# Patient Record
Sex: Male | Born: 1940 | Race: Black or African American | Hispanic: No | Marital: Married | Smoking: Former smoker
Health system: Southern US, Community
[De-identification: ages and names within clinical notes are randomized; demographics above are authoritative.]

## PROBLEM LIST (undated history)

## (undated) DIAGNOSIS — E119 Type 2 diabetes mellitus without complications: Secondary | ICD-10-CM

## (undated) DIAGNOSIS — E049 Nontoxic goiter, unspecified: Secondary | ICD-10-CM

## (undated) DIAGNOSIS — Z9889 Other specified postprocedural states: Secondary | ICD-10-CM

## (undated) DIAGNOSIS — E785 Hyperlipidemia, unspecified: Secondary | ICD-10-CM

## (undated) DIAGNOSIS — Z8719 Personal history of other diseases of the digestive system: Secondary | ICD-10-CM

## (undated) DIAGNOSIS — I10 Essential (primary) hypertension: Secondary | ICD-10-CM

## (undated) HISTORY — PX: HERNIA REPAIR: SHX51

## (undated) HISTORY — DX: Hyperlipidemia, unspecified: E78.5

## (undated) HISTORY — DX: Personal history of other diseases of the digestive system: Z87.19

## (undated) HISTORY — PX: COLONOSCOPY: SHX174

## (undated) HISTORY — DX: Nontoxic goiter, unspecified: E04.9

## (undated) HISTORY — DX: Other specified postprocedural states: Z98.890

## (undated) HISTORY — DX: Type 2 diabetes mellitus without complications: E11.9

---

## 2004-03-17 ENCOUNTER — Other Ambulatory Visit: Payer: Self-pay

## 2005-10-27 ENCOUNTER — Inpatient Hospital Stay: Payer: Self-pay

## 2006-05-25 ENCOUNTER — Encounter: Admission: RE | Admit: 2006-05-25 | Discharge: 2006-05-25 | Payer: Self-pay | Admitting: General Surgery

## 2006-05-28 ENCOUNTER — Ambulatory Visit (HOSPITAL_BASED_OUTPATIENT_CLINIC_OR_DEPARTMENT_OTHER): Admission: RE | Admit: 2006-05-28 | Discharge: 2006-05-28 | Payer: Self-pay | Admitting: General Surgery

## 2006-08-21 ENCOUNTER — Emergency Department (HOSPITAL_COMMUNITY): Admission: EM | Admit: 2006-08-21 | Discharge: 2006-08-21 | Payer: Self-pay | Admitting: Family Medicine

## 2006-11-06 HISTORY — PX: CYST EXCISION: SHX5701

## 2006-11-14 ENCOUNTER — Ambulatory Visit (HOSPITAL_COMMUNITY): Admission: RE | Admit: 2006-11-14 | Discharge: 2006-11-14 | Payer: Self-pay | Admitting: Gastroenterology

## 2008-06-29 ENCOUNTER — Emergency Department (HOSPITAL_COMMUNITY): Admission: EM | Admit: 2008-06-29 | Discharge: 2008-06-29 | Payer: Self-pay | Admitting: General Surgery

## 2011-03-24 NOTE — Op Note (Signed)
NAME:  Dennis Ford, Dennis Ford               ACCOUNT NO.:  [id number]   MEDICAL RECORD NO.:  [id number]          PATIENT TYPE:  AMB   LOCATION:  DSC                          FACILITY:  [facility]   PHYSICIAN:  Cherylynn Ridges, M.D.    DATE OF BIRTH:  22-Mar-1941   DATE OF PROCEDURE:  05/28/2006  DATE OF DISCHARGE:                                 OPERATIVE REPORT   PREOPERATIVE DIAGNOSIS:  Left inguinal hernia.   POSTOPERATIVE DIAGNOSIS:  Direct left inguinal hernia with lipoma of the  cord.   PROCEDURE:  Left inguinal hernia repair with mesh.   SURGEON:  Dr. Jimmye Norman.   ASSISTANT:  Dirk Dress, medical student.   ANESTHESIA:  General with a laryngeal airway.   ESTIMATED BLOOD LOSS:  Less than 20 mL.   COMPLICATIONS:  None.   CONDITION:  Stable.   INDICATIONS FOR OPERATION:  The patient is a 70 year old male with recently  diagnosed with left inguinal hernia who comes in now for repair.   FINDINGS:  The patient had a direct defect without an indirect component.  He did have a sizable small lipoma of the cord which was resected.   OPERATION:  The patient was taken to the operating room, placed on table in  supine position.  After an adequate general laryngeal airway anesthetic was  administered, he was prepped and draped in usual sterile manner exposing the  left groin area.   A 6-cm transverse curvilinear incision was made at the level of the  superficial ring and taken down to and through Scarpa's fascia down to the  external oblique fascia.  We then dissected out the external oblique fascia  in the superficial ring, made an incision along the fibers of the external  oblique through the superficial ring.  We were then able to dissect out the  spermatic cord along with the ilioinguinal ligament and the ilioinguinal  nerve and preserve the nerve and keep it out of the way during our repair.   Once we had spermatic cord adequately dissected free and we inspected the  anterior  medial aspect of the core where we did not find an indirect sac.  There was a lateral lipoma of the cord which was dissected back to almost  internal ring and then suture ligated at its base and transected.  The vas  deferens and the ilioinguinal nerve were preserved and kept out of the way  during the repair.  There was a large direct defect which protruded out  through the inguinal canal.  We imbricated on itself using two simple  stitches of 0 Ethibond suture and then we placed a piece of oval mesh  measuring approximately 4.5 x 2 cm in size on the floor of the inguinal  canal attaching it to the pubic tubercle, inferolaterally to the reflected  portion of inguinal ligament and anteromedially to the conjoined tendon.  This was done using running 0 Prolene suture.  Once that was secured in  place we passed spermatic cord back into the canal, oversewed the external  oblique fascia on top using running 3-0 Vicryl  suture taking care not to  entrap the ilioinguinal nerve with this repair.  We then irrigated with  antibiotic solution in which the which the mesh had been soaked prior to  implantation.  Once external oblique fascia was reapproximated we  reapproximated Scarpa fascia using three interrupted simple stitches of 3-0  Vicryl and skin was closed using running subcuticular stitch of 4-0  Monocryl.  Half percent Marcaine without epi was injected into the wound  prior to closure and a sterile dressing was applied.  All counts were  correct including needle sutures, instruments and tapes.      Cherylynn Ridges, M.D.  Electronically Signed     JOW/MEDQ  D:  05/28/2006  T:  05/28/2006  Job:  [id number]

## 2011-10-17 ENCOUNTER — Emergency Department (HOSPITAL_COMMUNITY)
Admission: EM | Admit: 2011-10-17 | Discharge: 2011-10-17 | Disposition: A | Discharge status: Home / Self Care | Payer: Medicare Other | Attending: Emergency Medicine | Admitting: Emergency Medicine

## 2011-10-17 ENCOUNTER — Emergency Department (HOSPITAL_COMMUNITY): Payer: Medicare Other

## 2011-10-17 ENCOUNTER — Encounter: Payer: Self-pay | Admitting: *Deleted

## 2011-10-17 DIAGNOSIS — K4091 Unilateral inguinal hernia, without obstruction or gangrene, recurrent: Secondary | ICD-10-CM | POA: Insufficient documentation

## 2011-10-17 DIAGNOSIS — R1011 Right upper quadrant pain: Secondary | ICD-10-CM | POA: Insufficient documentation

## 2011-10-17 DIAGNOSIS — Z79899 Other long term (current) drug therapy: Secondary | ICD-10-CM | POA: Insufficient documentation

## 2011-10-17 DIAGNOSIS — R109 Unspecified abdominal pain: Secondary | ICD-10-CM

## 2011-10-17 DIAGNOSIS — K4090 Unilateral inguinal hernia, without obstruction or gangrene, not specified as recurrent: Secondary | ICD-10-CM

## 2011-10-17 DIAGNOSIS — R197 Diarrhea, unspecified: Secondary | ICD-10-CM | POA: Insufficient documentation

## 2011-10-17 DIAGNOSIS — I10 Essential (primary) hypertension: Secondary | ICD-10-CM | POA: Insufficient documentation

## 2011-10-17 HISTORY — DX: Essential (primary) hypertension: I10

## 2011-10-17 LAB — URINALYSIS, ROUTINE W REFLEX MICROSCOPIC
Glucose, UA: NEGATIVE mg/dL
Hgb urine dipstick: NEGATIVE
Ketones, ur: NEGATIVE mg/dL
Leukocytes, UA: NEGATIVE
Specific Gravity, Urine: 1.006 (ref 1.005–1.030)

## 2011-10-17 LAB — DIFFERENTIAL
Basophils Absolute: 0 10*3/uL (ref 0.0–0.1)
Basophils Relative: 1 % (ref 0–1)
Eosinophils Relative: 1 % (ref 0–5)
Monocytes Relative: 9 % (ref 3–12)
Neutrophils Relative %: 42 % — ABNORMAL LOW (ref 43–77)

## 2011-10-17 LAB — CBC
Hemoglobin: 14.8 g/dL (ref 13.0–17.0)
MCHC: 33.5 g/dL (ref 30.0–36.0)
RDW: 15 % (ref 11.5–15.5)
WBC: 5.3 10*3/uL (ref 4.0–10.5)

## 2011-10-17 LAB — POCT I-STAT, CHEM 8
Creatinine, Ser: 0.9 mg/dL (ref 0.50–1.35)
Glucose, Bld: 105 mg/dL — ABNORMAL HIGH (ref 70–99)
HCT: 50 % (ref 39.0–52.0)

## 2011-10-17 MED ORDER — HYDROCODONE-ACETAMINOPHEN 5-325 MG PO TABS
1.0000 | ORAL_TABLET | Freq: Once | ORAL | Status: AC
  Administered 2011-10-17: 1 via ORAL
  Filled 2011-10-17: qty 1

## 2011-10-17 MED ORDER — HYDROCODONE-ACETAMINOPHEN 5-500 MG PO TABS: 1.0000 | ORAL_TABLET | Freq: Four times a day (QID) | ORAL | Status: AC | PRN

## 2011-10-17 NOTE — ED Provider Notes (Signed)
History     CSN: [id number] Arrival date & time: 10/17/2011  3:07 PM   First MD Initiated Contact with Patient 10/17/11 2011      Chief Complaint  Patient presents with  . Abdominal Pain    (Consider location/radiation/quality/duration/timing/severity/associated sxs/prior treatment) HPI Comments: Patient has a known right inguinal hernia is awaiting surgery sometime in January now is having right upper abdominal pain for "a while" no change in bowel habits.  No change in urinary frequency.  No UTI symptoms.  No nausea or vomiting.  Has not taken anything for pain.  Position does not change the pain.  No testicular pain  Patient is a 70 y.o. male presenting with abdominal pain. The history is provided by the patient.  Abdominal Pain The primary symptoms of the illness include abdominal pain and diarrhea. The primary symptoms of the illness do not include nausea, vomiting or dysuria. The current episode started more than 2 days ago. The onset of the illness was gradual. The problem has been gradually worsening.  The patient states that she believes she is currently not pregnant. The patient has not had a change in bowel habit. Symptoms associated with the illness do not include chills, anorexia, diaphoresis, heartburn, constipation, hematuria, frequency or back pain.    Past Medical History  Diagnosis Date  . Hypertension     Past Surgical History  Procedure Date  . Hernia repair     History reviewed. No pertinent family history.  History  Substance Use Topics  . Smoking status: Not on file  . Smokeless tobacco: Not on file  . Alcohol Use: No      Review of Systems  Constitutional: Negative for chills and diaphoresis.  HENT: Negative.   Eyes: Negative.   Respiratory: Negative.   Cardiovascular: Negative.   Gastrointestinal: Positive for abdominal pain and diarrhea. Negative for heartburn, nausea, vomiting, constipation, blood in stool and anorexia.  Genitourinary:  Negative for dysuria, frequency and hematuria.  Musculoskeletal: Negative for back pain.  Skin: Negative.   Neurological: Negative.   Hematological: Negative.   Psychiatric/Behavioral: Negative.     Allergies  Review of patient's allergies indicates no known allergies.  Home Medications   Current Outpatient Rx  Name Route Sig Dispense Refill  . ACETAMINOPHEN 500 MG PO TABS Oral Take 1,000 mg by mouth every 6 (six) hours as needed. Usually only needs once per day   For pain     . ALLOPURINOL 100 MG PO TABS Oral Take 100 mg by mouth daily.      Marland Kitchen BENAZEPRIL HCL 20 MG PO TABS Oral Take 10 mg by mouth daily.      Marland Kitchen HYDROCODONE-ACETAMINOPHEN 5-500 MG PO TABS Oral Take 1-2 tablets by mouth every 6 (six) hours as needed for pain. 15 tablet 0    BP 157/89  Pulse 83  Temp(Src) 98.1 F (36.7 C) (Oral)  Resp 19  SpO2 98%  Physical Exam  Constitutional: He is oriented to person, place, and time. He appears well-developed and well-nourished.  HENT:  Head: Normocephalic.  Eyes: Pupils are equal, round, and reactive to light.  Neck: Normal range of motion.  Cardiovascular: Normal rate.   Pulmonary/Chest: Breath sounds normal.  Abdominal: Soft. Bowel sounds are normal. He exhibits no distension. There is no hepatosplenomegaly. There is tenderness in the right upper quadrant and right lower quadrant. There is no rigidity, no rebound, no guarding, no CVA tenderness and negative Murphy's sign. A hernia is present. Hernia confirmed positive in the  right inguinal area.       R inguinal hernia  Moves freely  within the inguinal canal no sign of incarceration   Genitourinary: Penis normal.  Musculoskeletal: Normal range of motion.  Neurological: He is oriented to person, place, and time.  Skin: Skin is warm and dry.    ED Course  Procedures (including critical care time)  Labs Reviewed  DIFFERENTIAL - Abnormal; Notable for the following:    Neutrophils Relative 42 (*)    Lymphocytes  Relative 48 (*)    All other components within normal limits  POCT I-STAT, CHEM 8 - Abnormal; Notable for the following:    Glucose, Bld 105 (*)    All other components within normal limits  URINALYSIS, ROUTINE W REFLEX MICROSCOPIC  CBC  I-STAT, CHEM 8   Dg Abd Acute W/chest  10/17/2011  *RADIOLOGY REPORT*  Clinical Data: Right flank pain, shortness of breath.  Hernia repair.  Hypertension.  ACUTE ABDOMEN SERIES (ABDOMEN 2 VIEW & CHEST 1 VIEW)  Comparison: Chest 06/29/2008  Findings: Slightly shallow inspiration. Normal heart size and pulmonary vascularity.  No focal consolidation in the lungs.  No blunting of costophrenic angles.  Scattered gas and stool in the colon.  No small or large bowel dilatation.  No free intra-abdominal air.  No abnormal air fluid levels.  No radiopaque stones.  Mild degenerative changes in the spine.  IMPRESSION: No evidence of active pulmonary disease.  Nonobstructive bowel gas pattern.  Original Report Authenticated By: Marlon Pel, M.D.     1. Hernia, inguinal, right   2. Abdominal discomfort     After review of labs, urine, and acute abdomen series.  There are no abnormalities.  Discussed this with patient after he received the hydrocodone.  He is pain-free.  Will prescribe hydrocodone for home use.  He  will be instructed to call the surgeon to hopefully schedule surgery sooner  MDM  Will obtain CBC i-STAT to evaluate kidney function signs of infection and get acute abdomen to rule out obstruction.  If these are all within normal limits will prescribe pain medication and have patient followup with surgeon        Arman Filter, NP 10/17/11 2055  Arman Filter, NP 10/17/11 2236  Arman Filter, NP 10/17/11 2239  Arman Filter, NP 10/17/11 2242

## 2011-10-17 NOTE — ED Notes (Signed)
Pt states that he has been having abdominal pain for a few days. Pt state that he has a hernia on his right inguinal area and that the pain has moved to his upper right side and flank. Pt alert and oriented denies any problems with urination or bowels. Pt able to move all extremities and follow commands.

## 2011-10-17 NOTE — ED Notes (Signed)
To ed for eval of rlq pain for 'a while'. States he has a known hernia and has surgery scheduled for Jan. Pt states he is unable to sleep sometimes due to the pain. Normal BM's. Denies fevers or vomiting.

## 2011-10-18 NOTE — ED Provider Notes (Signed)
Evaluation and management procedures were performed by the PA/NP under my supervision/collaboration.   Dione Booze, MD 10/18/11 [place]

## 2011-11-14 ENCOUNTER — Encounter (INDEPENDENT_AMBULATORY_CARE_PROVIDER_SITE_OTHER): Payer: Self-pay | Admitting: General Surgery

## 2011-11-14 ENCOUNTER — Ambulatory Visit (INDEPENDENT_AMBULATORY_CARE_PROVIDER_SITE_OTHER): Payer: MEDICARE | Admitting: General Surgery

## 2011-11-14 DIAGNOSIS — K4090 Unilateral inguinal hernia, without obstruction or gangrene, not specified as recurrent: Secondary | ICD-10-CM

## 2011-11-14 NOTE — Progress Notes (Signed)
Patient ID: Dennis Ford, male   DOB: 01-29-1941, 71 y.o.   MRN: [id number]  Chief Complaint  Patient presents with  . Other    est pt new prob- eval hernia    HPI Dennis Ford is a 71 y.o. male.  Right inguinal hernia HPI  Past Medical History  Diagnosis Date  . Hypertension   . History of inguinal hernia repair     Past Surgical History  Procedure Date  . Hernia repair     [facility]     History reviewed. No pertinent family history.  Social History History  Substance Use Topics  . Smoking status: Former Games developer  . Smokeless tobacco: Not on file  . Alcohol Use: No    No Known Allergies  Current Outpatient Prescriptions  Medication Sig Dispense Refill  . acetaminophen (TYLENOL) 500 MG tablet Take 1,000 mg by mouth every 6 (six) hours as needed. Usually only needs once per day   For pain       . allopurinol (ZYLOPRIM) 100 MG tablet Take 100 mg by mouth daily.        Marland Kitchen amLODipine (NORVASC) 10 MG tablet daily.      . benazepril (LOTENSIN) 20 MG tablet Take 10 mg by mouth daily.          Review of Systems Review of Systems  Constitutional: Negative.   HENT: Negative.   Eyes: Negative.   Respiratory: Negative.   Gastrointestinal: Positive for abdominal pain (right flank and side related to hernia).  Musculoskeletal: Negative.   Skin: Negative.   Neurological: Negative.   Hematological: Negative.   Psychiatric/Behavioral: Negative.     Blood pressure 156/86, pulse 84, temperature 98.6 F (37 C), temperature source Temporal, resp. rate 20, height 5' 8.5" (1.74 m), weight 182 lb 3.2 oz (82.645 kg).  Physical Exam Physical Exam  Constitutional: He is oriented to person, place, and time. He appears well-developed and well-nourished.  HENT:  Head: Normocephalic and atraumatic.  Eyes: Conjunctivae and EOM are normal. Pupils are equal, round, and reactive to light.  Neck: Neck supple.  Cardiovascular: Normal rate, regular rhythm and normal heart sounds.     Pulmonary/Chest: Effort normal and breath sounds normal.  Abdominal: Soft. He exhibits mass (Easily reducible right inguinal hernia). There is tenderness (RLQ and right flank). There is no rebound and no guarding.  Genitourinary: Rectum normal.  Musculoskeletal: Normal range of motion.  Neurological: He is alert and oriented to person, place, and time.  Skin: Skin is warm and dry.  Psychiatric: He has a normal mood and affect. His behavior is normal. Judgment and thought content normal.    Data Reviewed No current data to review  Assessment    Reducible right inguinal hernia    Plan    Open right inguinal hernia repair       Miyako Oelke III,Chardae Mulkern O 11/14/2011, 10:49 AM

## 2011-11-23 ENCOUNTER — Encounter (HOSPITAL_BASED_OUTPATIENT_CLINIC_OR_DEPARTMENT_OTHER): Payer: Self-pay | Admitting: *Deleted

## 2011-11-23 NOTE — Progress Notes (Signed)
To come in for labs and ekg-was in ED 12/12 with abd pain-referred for hernia

## 2011-11-24 ENCOUNTER — Encounter (HOSPITAL_BASED_OUTPATIENT_CLINIC_OR_DEPARTMENT_OTHER)
Admission: RE | Admit: 2011-11-24 | Discharge: 2011-11-24 | Disposition: A | Discharge status: Home / Self Care | Payer: Medicare Other | Source: Ambulatory Visit | Attending: General Surgery | Admitting: General Surgery

## 2011-11-24 LAB — CBC
Hemoglobin: 13.9 g/dL (ref 13.0–17.0)
MCHC: 32.6 g/dL (ref 30.0–36.0)
MCV: 83.2 fL (ref 78.0–100.0)
RBC: 5.13 MIL/uL (ref 4.22–5.81)
RDW: 15.6 % — ABNORMAL HIGH (ref 11.5–15.5)

## 2011-11-24 LAB — BASIC METABOLIC PANEL
BUN: 11 mg/dL (ref 6–23)
CO2: 25 mEq/L (ref 19–32)
Creatinine, Ser: 0.88 mg/dL (ref 0.50–1.35)
GFR calc non Af Amer: 85 mL/min — ABNORMAL LOW (ref >90)
Glucose, Bld: 89 mg/dL (ref 70–99)
Potassium: 4.3 mEq/L (ref 3.5–5.1)

## 2011-11-27 ENCOUNTER — Encounter (HOSPITAL_BASED_OUTPATIENT_CLINIC_OR_DEPARTMENT_OTHER): Payer: Self-pay | Admitting: Anesthesiology

## 2011-11-27 ENCOUNTER — Ambulatory Visit (HOSPITAL_BASED_OUTPATIENT_CLINIC_OR_DEPARTMENT_OTHER)
Admission: RE | Admit: 2011-11-27 | Discharge: 2011-11-27 | Disposition: A | Discharge status: Home / Self Care | Payer: Medicare Other | Source: Ambulatory Visit | Attending: General Surgery | Admitting: General Surgery

## 2011-11-27 ENCOUNTER — Encounter (HOSPITAL_BASED_OUTPATIENT_CLINIC_OR_DEPARTMENT_OTHER): Payer: Self-pay | Admitting: *Deleted

## 2011-11-27 ENCOUNTER — Ambulatory Visit (HOSPITAL_BASED_OUTPATIENT_CLINIC_OR_DEPARTMENT_OTHER): Payer: Medicare Other | Admitting: Anesthesiology

## 2011-11-27 ENCOUNTER — Encounter (HOSPITAL_BASED_OUTPATIENT_CLINIC_OR_DEPARTMENT_OTHER)
Admission: RE | Disposition: A | Discharge status: Home / Self Care | Payer: Self-pay | Source: Ambulatory Visit | Attending: General Surgery

## 2011-11-27 DIAGNOSIS — K4090 Unilateral inguinal hernia, without obstruction or gangrene, not specified as recurrent: Secondary | ICD-10-CM

## 2011-11-27 DIAGNOSIS — Z01812 Encounter for preprocedural laboratory examination: Secondary | ICD-10-CM | POA: Insufficient documentation

## 2011-11-27 DIAGNOSIS — I10 Essential (primary) hypertension: Secondary | ICD-10-CM | POA: Insufficient documentation

## 2011-11-27 HISTORY — PX: INGUINAL HERNIA REPAIR: SHX194

## 2011-11-27 LAB — POCT HEMOGLOBIN-HEMACUE: Hemoglobin: 16.3 g/dL (ref 13.0–17.0)

## 2011-11-27 SURGERY — REPAIR, HERNIA, INGUINAL, ADULT
Anesthesia: General | Site: Groin | Laterality: Right | Wound class: Clean

## 2011-11-27 MED ORDER — DEXAMETHASONE SODIUM PHOSPHATE 4 MG/ML IJ SOLN
INTRAMUSCULAR | Status: DC | PRN
  Administered 2011-11-27: 4 mg via INTRAVENOUS

## 2011-11-27 MED ORDER — PROMETHAZINE HCL 25 MG/ML IJ SOLN: 6.2500 mg | INTRAMUSCULAR | Status: DC | PRN

## 2011-11-27 MED ORDER — CHLORHEXIDINE GLUCONATE 4 % EX LIQD: 1.0000 "application " | Freq: Once | CUTANEOUS | Status: DC

## 2011-11-27 MED ORDER — ONDANSETRON HCL 4 MG/2ML IJ SOLN
INTRAMUSCULAR | Status: DC | PRN
  Administered 2011-11-27: 4 mg via INTRAVENOUS

## 2011-11-27 MED ORDER — SODIUM CHLORIDE 0.9 % IR SOLN
Status: DC | PRN
  Administered 2011-11-27: 10:00:00

## 2011-11-27 MED ORDER — BUPIVACAINE-EPINEPHRINE PF 0.25-1:200000 % IJ SOLN
INTRAMUSCULAR | Status: DC | PRN
  Administered 2011-11-27: 20 mL

## 2011-11-27 MED ORDER — CEFAZOLIN SODIUM-DEXTROSE 2-3 GM-% IV SOLR
2.0000 g | INTRAVENOUS | Status: AC
  Administered 2011-11-27: 2 g via INTRAVENOUS

## 2011-11-27 MED ORDER — PROPOFOL 10 MG/ML IV EMUL
INTRAVENOUS | Status: DC | PRN
  Administered 2011-11-27: 200 mg via INTRAVENOUS

## 2011-11-27 MED ORDER — MIDAZOLAM HCL 2 MG/2ML IJ SOLN: 1.0000 mg | INTRAMUSCULAR | Status: DC | PRN

## 2011-11-27 MED ORDER — LORAZEPAM 2 MG/ML IJ SOLN: 1.0000 mg | Freq: Once | INTRAMUSCULAR | Status: DC | PRN

## 2011-11-27 MED ORDER — LACTATED RINGERS IV SOLN
INTRAVENOUS | Status: DC
  Administered 2011-11-27 (×2): via INTRAVENOUS

## 2011-11-27 MED ORDER — HYDROCODONE-ACETAMINOPHEN 5-500 MG PO TABS: 1.0000 | ORAL_TABLET | Freq: Four times a day (QID) | ORAL | Status: AC | PRN

## 2011-11-27 MED ORDER — FENTANYL CITRATE 0.05 MG/ML IJ SOLN
INTRAMUSCULAR | Status: DC | PRN
  Administered 2011-11-27: 100 ug via INTRAVENOUS

## 2011-11-27 MED ORDER — HYDROCODONE-ACETAMINOPHEN 5-325 MG PO TABS
1.0000 | ORAL_TABLET | Freq: Once | ORAL | Status: AC
  Administered 2011-11-27: 1 via ORAL

## 2011-11-27 MED ORDER — HYDROMORPHONE HCL PF 1 MG/ML IJ SOLN
0.2500 mg | INTRAMUSCULAR | Status: DC | PRN
  Administered 2011-11-27 (×2): 0.5 mg via INTRAVENOUS

## 2011-11-27 MED ORDER — FENTANYL CITRATE 0.05 MG/ML IJ SOLN: 50.0000 ug | INTRAMUSCULAR | Status: DC | PRN

## 2011-11-27 MED ORDER — MIDAZOLAM HCL 5 MG/5ML IJ SOLN
INTRAMUSCULAR | Status: DC | PRN
  Administered 2011-11-27: 1 mg via INTRAVENOUS

## 2011-11-27 MED ORDER — EPHEDRINE SULFATE 50 MG/ML IJ SOLN
INTRAMUSCULAR | Status: DC | PRN
  Administered 2011-11-27: 10 mg via INTRAVENOUS

## 2011-11-27 MED ORDER — LIDOCAINE HCL (CARDIAC) 20 MG/ML IV SOLN
INTRAVENOUS | Status: DC | PRN
  Administered 2011-11-27: 60 mg via INTRAVENOUS

## 2011-11-27 SURGICAL SUPPLY — 52 items
BAG DECANTER FOR FLEXI CONT (MISCELLANEOUS) ×2 IMPLANT
BLADE SURG 10 STRL SS (BLADE) ×2 IMPLANT
BLADE SURG 15 STRL LF DISP TIS (BLADE) ×2 IMPLANT
BLADE SURG 15 STRL SS (BLADE) ×2
BLADE SURG ROTATE 9660 (MISCELLANEOUS) ×2 IMPLANT
CANISTER SUCTION 1200CC (MISCELLANEOUS) IMPLANT
CHLORAPREP W/TINT 26ML (MISCELLANEOUS) ×4 IMPLANT
CLEANER CAUTERY TIP 5X5 PAD (MISCELLANEOUS) ×1 IMPLANT
CLOTH BEACON ORANGE TIMEOUT ST (SAFETY) ×2 IMPLANT
COVER MAYO STAND STRL (DRAPES) ×2 IMPLANT
COVER TABLE BACK 60X90 (DRAPES) ×2 IMPLANT
DECANTER SPIKE VIAL GLASS SM (MISCELLANEOUS) ×2 IMPLANT
DERMABOND ADVANCED (GAUZE/BANDAGES/DRESSINGS) ×1
DERMABOND ADVANCED .7 DNX12 (GAUZE/BANDAGES/DRESSINGS) ×1 IMPLANT
DRAIN PENROSE 1/2X12 LTX STRL (WOUND CARE) ×2 IMPLANT
DRAPE LAPAROTOMY TRNSV 102X78 (DRAPE) ×2 IMPLANT
DRAPE UTILITY XL STRL (DRAPES) ×2 IMPLANT
DRSG TEGADERM 2-3/8X2-3/4 SM (GAUZE/BANDAGES/DRESSINGS) IMPLANT
DRSG TEGADERM 4X4.75 (GAUZE/BANDAGES/DRESSINGS) ×2 IMPLANT
ELECT REM PT RETURN 9FT ADLT (ELECTROSURGICAL) ×2
ELECTRODE REM PT RTRN 9FT ADLT (ELECTROSURGICAL) ×1 IMPLANT
GLOVE BIO SURGEON STRL SZ7 (GLOVE) ×2 IMPLANT
GLOVE BIOGEL PI IND STRL 7.0 (GLOVE) ×1 IMPLANT
GLOVE BIOGEL PI IND STRL 8 (GLOVE) ×1 IMPLANT
GLOVE BIOGEL PI INDICATOR 7.0 (GLOVE) ×1
GLOVE BIOGEL PI INDICATOR 8 (GLOVE) ×1
GLOVE ECLIPSE 7.5 STRL STRAW (GLOVE) ×2 IMPLANT
GOWN PREVENTION PLUS XLARGE (GOWN DISPOSABLE) ×4 IMPLANT
MESH HERNIA 3X6 (Mesh General) ×2 IMPLANT
NEEDLE HYPO 25X1 1.5 SAFETY (NEEDLE) ×2 IMPLANT
NS IRRIG 1000ML POUR BTL (IV SOLUTION) IMPLANT
PACK BASIN DAY SURGERY FS (CUSTOM PROCEDURE TRAY) ×2 IMPLANT
PAD CLEANER CAUTERY TIP 5X5 (MISCELLANEOUS) ×1
PENCIL BUTTON HOLSTER BLD 10FT (ELECTRODE) ×2 IMPLANT
SLEEVE SCD COMPRESS KNEE MED (MISCELLANEOUS) ×2 IMPLANT
SPONGE INTESTINAL PEANUT (DISPOSABLE) ×2 IMPLANT
SPONGE LAP 4X18 X RAY DECT (DISPOSABLE) ×2 IMPLANT
STRIP CLOSURE SKIN 1/2X4 (GAUZE/BANDAGES/DRESSINGS) ×2 IMPLANT
SUT ETHIBOND 0 MO6 C/R (SUTURE) ×2 IMPLANT
SUT MON AB 4-0 PC3 18 (SUTURE) ×2 IMPLANT
SUT PROLENE 0 CT 2 (SUTURE) ×4 IMPLANT
SUT VIC AB 3-0 SH 27 (SUTURE) ×2
SUT VIC AB 3-0 SH 27X BRD (SUTURE) ×2 IMPLANT
SUT VICRYL 4-0 PS2 18IN ABS (SUTURE) IMPLANT
SUT VICRYL AB 3 0 TIES (SUTURE) IMPLANT
SYR BULB 3OZ (MISCELLANEOUS) ×2 IMPLANT
SYR CONTROL 10ML LL (SYRINGE) ×2 IMPLANT
TOWEL OR 17X24 6PK STRL BLUE (TOWEL DISPOSABLE) ×2 IMPLANT
TOWEL OR NON WOVEN STRL DISP B (DISPOSABLE) ×2 IMPLANT
TUBE CONNECTING 20X1/4 (TUBING) IMPLANT
WATER STERILE IRR 1000ML POUR (IV SOLUTION) IMPLANT
YANKAUER SUCT BULB TIP NO VENT (SUCTIONS) IMPLANT

## 2011-11-27 NOTE — Anesthesia Preprocedure Evaluation (Signed)
Anesthesia Evaluation  Patient identified by MRN, date of birth, ID band Patient awake    Reviewed: Allergy & Precautions, H&P , NPO status , Patient's Chart, lab work & pertinent test results  Airway Mallampati: I TM Distance: >3 FB Neck ROM: Full    Dental   Pulmonary    Pulmonary exam normal       Cardiovascular hypertension,     Neuro/Psych    GI/Hepatic   Endo/Other    Renal/GU      Musculoskeletal   Abdominal   Peds  Hematology   Anesthesia Other Findings   Reproductive/Obstetrics                           Anesthesia Physical Anesthesia Plan  ASA: II  Anesthesia Plan: General   Post-op Pain Management:    Induction: Intravenous  Airway Management Planned: LMA  Additional Equipment:   Intra-op Plan:   Post-operative Plan: Extubation in OR  Informed Consent: I have reviewed the patients History and Physical, chart, labs and discussed the procedure including the risks, benefits and alternatives for the proposed anesthesia with the patient or authorized representative who has indicated his/her understanding and acceptance.     Plan Discussed with: CRNA and Surgeon  Anesthesia Plan Comments:         Anesthesia Quick Evaluation  

## 2011-11-27 NOTE — Anesthesia Postprocedure Evaluation (Signed)
  Anesthesia Post-op Note  Patient: Dennis Ford  Procedure(s) Performed:  HERNIA REPAIR INGUINAL ADULT - right inguinal hernia repair with mesh  Patient Location: PACU  Anesthesia Type: General  Level of Consciousness: awake, alert  and oriented  Airway and Oxygen Therapy: Patient Spontanous Breathing  Post-op Pain: mild  Post-op Assessment: Post-op Vital signs reviewed, Patient's Cardiovascular Status Stable, Respiratory Function Stable, Patent Airway, No signs of Nausea or vomiting, Adequate PO intake and Pain level controlled  Post-op Vital Signs: stable  Complications: No apparent anesthesia complications

## 2011-11-27 NOTE — Anesthesia Procedure Notes (Signed)
Procedure Name: LMA Insertion Date/Time: 11/27/2011 9:23 AM Performed by: Gladys Damme Pre-anesthesia Checklist: Patient identified, Timeout performed, Emergency Drugs available, Suction available and Patient being monitored Patient Re-evaluated:Patient Re-evaluated prior to inductionOxygen Delivery Method: Circle System Utilized Preoxygenation: Pre-oxygenation with 100% oxygen Intubation Type: IV induction Ventilation: Mask ventilation without difficulty LMA: LMA inserted LMA Size: 4.0 Number of attempts: 1 Placement Confirmation: breath sounds checked- equal and bilateral and positive ETCO2 Tube secured with: Tape Dental Injury: Teeth and Oropharynx as per pre-operative assessment

## 2011-11-27 NOTE — Op Note (Addendum)
OPERATIVE REPORT  DATE OF OPERATION: 11/27/2011  PATIENT:  [name]  71 y.o. male  PRE-OPERATIVE DIAGNOSIS:  right inguinal hernia  POST-OPERATIVE DIAGNOSIS:  right inguinal hernia, Indirect  PROCEDURE:  Procedure(s): HERNIA REPAIR INGUINAL ADULT  SURGEON:  Surgeon(s): Cherylynn Ridges III, MD  ASSISTANT: None  ANESTHESIA:   general  EBL: 10 ml  BLOOD ADMINISTERED: none  DRAINS: none   SPECIMEN:  No Specimen  COUNTS CORRECT:  YES  PROCEDURE DETAILS: The patient was seen again in the Holding Room. The risks, benefits, complications, treatment options, and expected outcomes were discussed with the patient. The possibilities of reaction to medication, pulmonary aspiration, perforation of viscus, bleeding, recurrent infection, the need for additional procedures, and development of a complication requiring transfusion or further operation were discussed with the patient and/or family. There was concurrence with the proposed plan, and informed consent was obtained. The site of surgery was properly noted/marked. The patient was taken to the Operating Room, identified as Americo Vallery, and the procedure verified as left inguinal hernia repair. A Time Out was held and the above information confirmed.  The patient was placed in the supine position and underwent induction of anesthesia, the lower abdomen and groin was prepped and draped in the standard fashion. A transverse incision was made on the right side.. Dissection was carried through the soft tissue to expose the inguinal canal and inguinal ligament along its lower edge. The external oblique fascia was split along the course of its fibers, exposing the inguinal canal. The cord and nerve were looped using a Penrose drain and reflected out of the field.  The indirect sac was dissected free of the spermatic cord and suture ligated at the base with two 0 Ethibond sutures The defect was exposed and a piece of mesh which had been soaked in  antibiotic solution was trimmed to size and placed over the defect. Running 0 Prolene suture was then used in a continuous fashion to repair the defect, with the suture being sewn from the pubic tubercle and reflected border of the inguinal ligament inferiorly and laterally, and then superior and medially to the conjoined tendon to a level just beyond the internal ring.  The contents were then returned to canal and the external oblique fashion was then closed in a continuous fashion using 3-0 Vicryl suture taking care not to cause entrapment.  Scarpa's fascia was reapproximated using 30 Vicryl.  The skin was then closed using a running 4-0 Monocryl subcuticular stitch.  Dermabond, Steri-Strips, and Tagaderm were then used to complete the dressing. 0.25% bupivicaine was injected into the wound and at the ASIS, 20 cc were used.  Instrument, sponge, and needle counts were correct prior to closure and at the conclusion of the case.   PATIENT DISPOSITION:  PACU - hemodynamically stable.   Pharrah Rottman III,Bradie Sangiovanni O 1/21/201310:27 AM

## 2011-11-27 NOTE — H&P (Signed)
  Progress Notes     Patient ID: Dennis Ford, male   DOB: 07-26-1941, 71 y.o.   MRN: [id number]    Chief Complaint   Patient presents with   .  Other       est pt new prob- eval hernia    HPI Dennis Ford is a 71 y.o. male.  Right inguinal hernia  Past Medical History   Diagnosis  Date   .  Hypertension     .  History of inguinal hernia repair      Past Surgical History   Procedure  Date   .  Hernia repair         [facility]     History reviewed. No pertinent family history.  Social History History   Substance Use Topics   .  Smoking status:  Former Games developer   .  Smokeless tobacco:  Not on file   .  Alcohol Use:  No    No Known Allergies    Current Outpatient Prescriptions   Medication  Sig  Dispense  Refill   .  acetaminophen (TYLENOL) 500 MG tablet  Take 1,000 mg by mouth every 6 (six) hours as needed. Usually only needs once per day   For pain          .  allopurinol (ZYLOPRIM) 100 MG tablet  Take 100 mg by mouth daily.           Marland Kitchen  amLODipine (NORVASC) 10 MG tablet  daily.         .  benazepril (LOTENSIN) 20 MG tablet  Take 10 mg by mouth daily.             Review of Systems  Constitutional: Negative.   HENT: Negative.   Eyes: Negative.   Respiratory: Negative.   Gastrointestinal: Positive for abdominal pain (right flank and side related to hernia).  Musculoskeletal: Negative.   Skin: Negative.   Neurological: Negative.   Hematological: Negative.   Psychiatric/Behavioral: Negative.    Blood pressure 156/86, pulse 84, temperature 98.6 F (37 C), temperature source Temporal, resp. rate 20, height 5' 8.5" (1.74 m), weight 182 lb 3.2 oz (82.645 kg).  Physical Exam  Constitutional: He is oriented to person, place, and time. He appears well-developed and well-nourished.  HENT:   Head: Normocephalic and atraumatic.  Eyes: Conjunctivae and EOM are normal. Pupils are equal, round, and reactive to light.  Neck: Neck supple.  Cardiovascular: Normal rate, regular rhythm  and normal heart sounds.   Pulmonary/Chest: Effort normal and breath sounds normal.  Abdominal: Soft. He exhibits mass (Easily reducible right inguinal hernia). There is tenderness (RLQ and right flank). There is no rebound and no guarding.  Genitourinary: Rectum normal.  Musculoskeletal: Normal range of motion.  Neurological: He is alert and oriented to person, place, and time.  Skin: Skin is warm and dry.  Psychiatric: He has a normal mood and affect. His behavior is normal. Judgment and thought content normal.   Data Reviewed No current data to review  Assessment    Reducible right inguinal hernia   Plan    Open right inguinal hernia repair  Marta Lamas. Gae Bon, MD, FACS [phone] [facility]

## 2011-11-27 NOTE — Transfer of Care (Signed)
Immediate Anesthesia Transfer of Care Note  Patient: Dennis Ford  Procedure(s) Performed:  HERNIA REPAIR INGUINAL ADULT - right inguinal hernia repair with mesh  Patient Location: PACU  Anesthesia Type: General  Level of Consciousness: sedated  Airway & Oxygen Therapy: Patient Spontanous Breathing and Patient connected to face mask oxygen  Post-op Assessment: Report given to PACU RN and Post -op Vital signs reviewed and stable  Post vital signs: Reviewed and stable  Complications: No apparent anesthesia complications

## 2011-11-28 ENCOUNTER — Encounter (HOSPITAL_BASED_OUTPATIENT_CLINIC_OR_DEPARTMENT_OTHER): Payer: Self-pay | Admitting: General Surgery

## 2011-12-05 ENCOUNTER — Telehealth (INDEPENDENT_AMBULATORY_CARE_PROVIDER_SITE_OTHER): Payer: Self-pay | Admitting: General Surgery

## 2011-12-07 ENCOUNTER — Ambulatory Visit (INDEPENDENT_AMBULATORY_CARE_PROVIDER_SITE_OTHER): Payer: Medicare Other

## 2011-12-07 DIAGNOSIS — Z9889 Other specified postprocedural states: Secondary | ICD-10-CM

## 2011-12-07 NOTE — Progress Notes (Signed)
Patient came in for staple removal per discharge but when I looked at incision he didn't have ant staples. Incision looks healed.

## 2011-12-19 ENCOUNTER — Ambulatory Visit (INDEPENDENT_AMBULATORY_CARE_PROVIDER_SITE_OTHER): Payer: Medicare Other | Admitting: General Surgery

## 2011-12-19 ENCOUNTER — Encounter (INDEPENDENT_AMBULATORY_CARE_PROVIDER_SITE_OTHER): Payer: Self-pay | Admitting: General Surgery

## 2011-12-19 VITALS — BP 138/88 | HR 72 | Temp 98.2°F | Resp 12 | Ht 68.0 in | Wt 183.6 lb

## 2011-12-19 DIAGNOSIS — Z09 Encounter for follow-up examination after completed treatment for conditions other than malignant neoplasm: Secondary | ICD-10-CM | POA: Insufficient documentation

## 2011-12-19 NOTE — Progress Notes (Signed)
HPI The patient is doing well status post open right inguinal hernia repair.  PE On examination there is no infection. There is no recurrent hernia.  Studiy review There are no studies to review.  Assessment Doing status post right inguinal hernia repair.  Plan Return to see me on a p.r.n. basis

## 2012-06-01 ENCOUNTER — Emergency Department: Payer: Self-pay | Admitting: Emergency Medicine

## 2012-06-10 ENCOUNTER — Ambulatory Visit: Payer: Self-pay | Admitting: Family Medicine

## 2012-08-12 ENCOUNTER — Ambulatory Visit: Payer: Self-pay | Admitting: Family Medicine

## 2013-05-06 ENCOUNTER — Ambulatory Visit: Payer: Self-pay | Admitting: Family Medicine

## 2013-07-11 IMAGING — CR DG ABDOMEN ACUTE W/ 1V CHEST
3 series · 3 of 3 positions shown · non-contrast
Comparison: Chest 06/29/2008

CLINICAL DATA: Right flank pain, shortness of breath.  Hernia
repair.  Hypertension.

ACUTE ABDOMEN SERIES (ABDOMEN 2 VIEW & CHEST 1 VIEW)

[w chest pa]
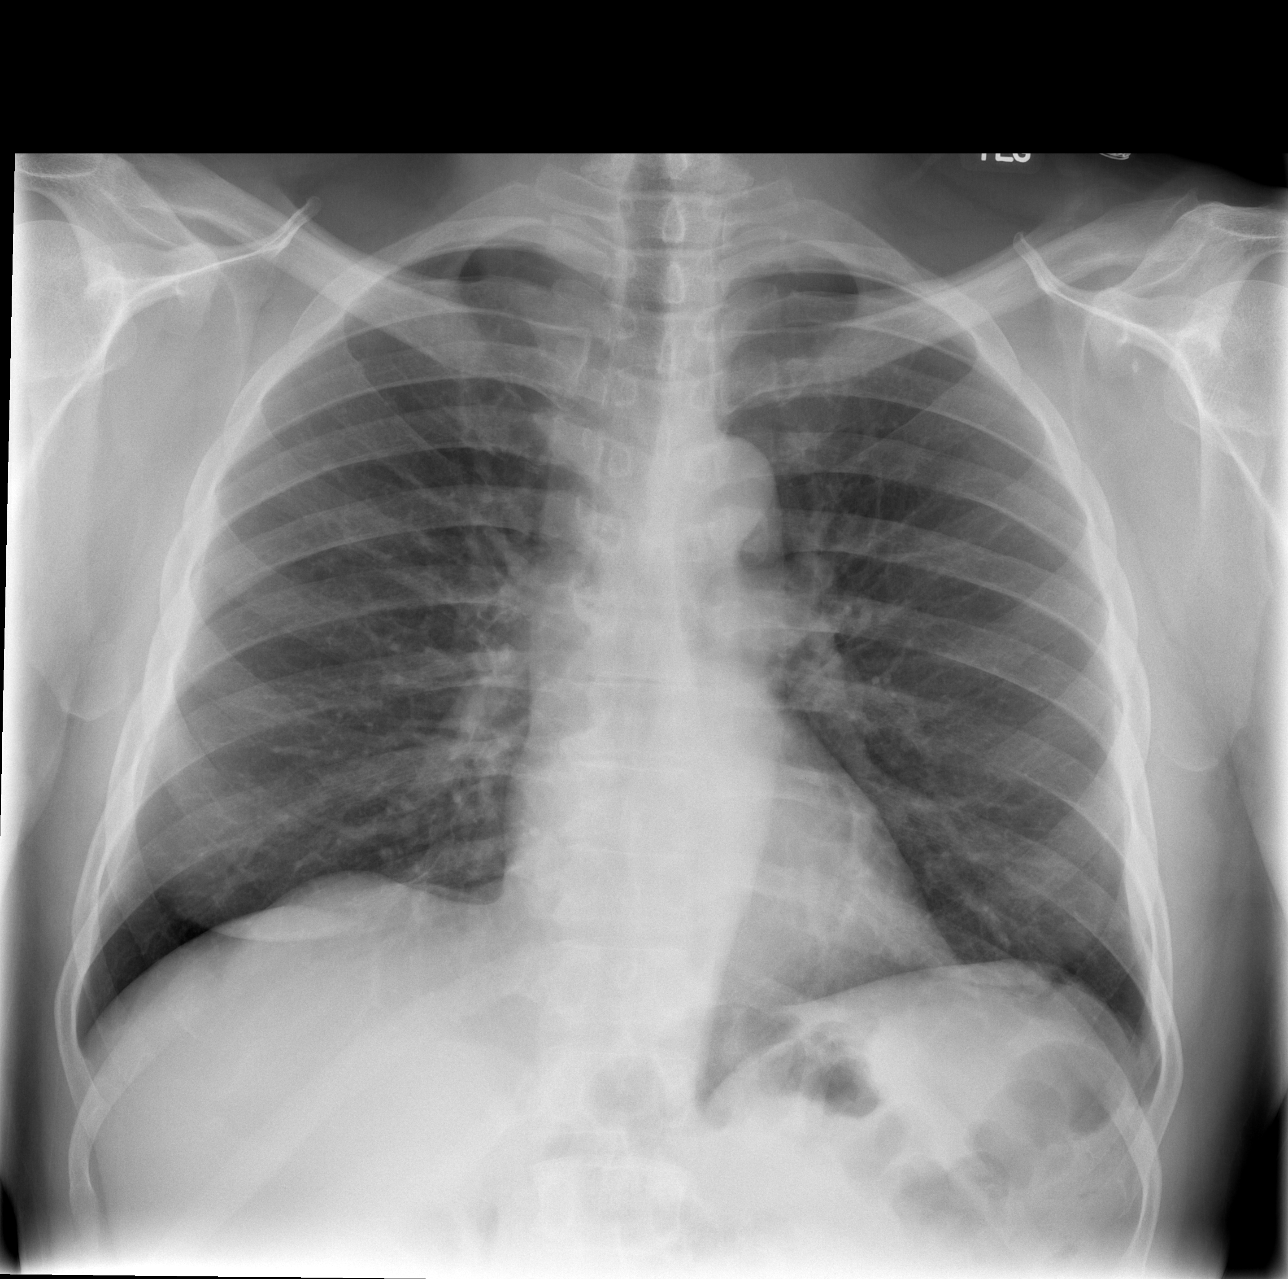

[w abdomen upright]
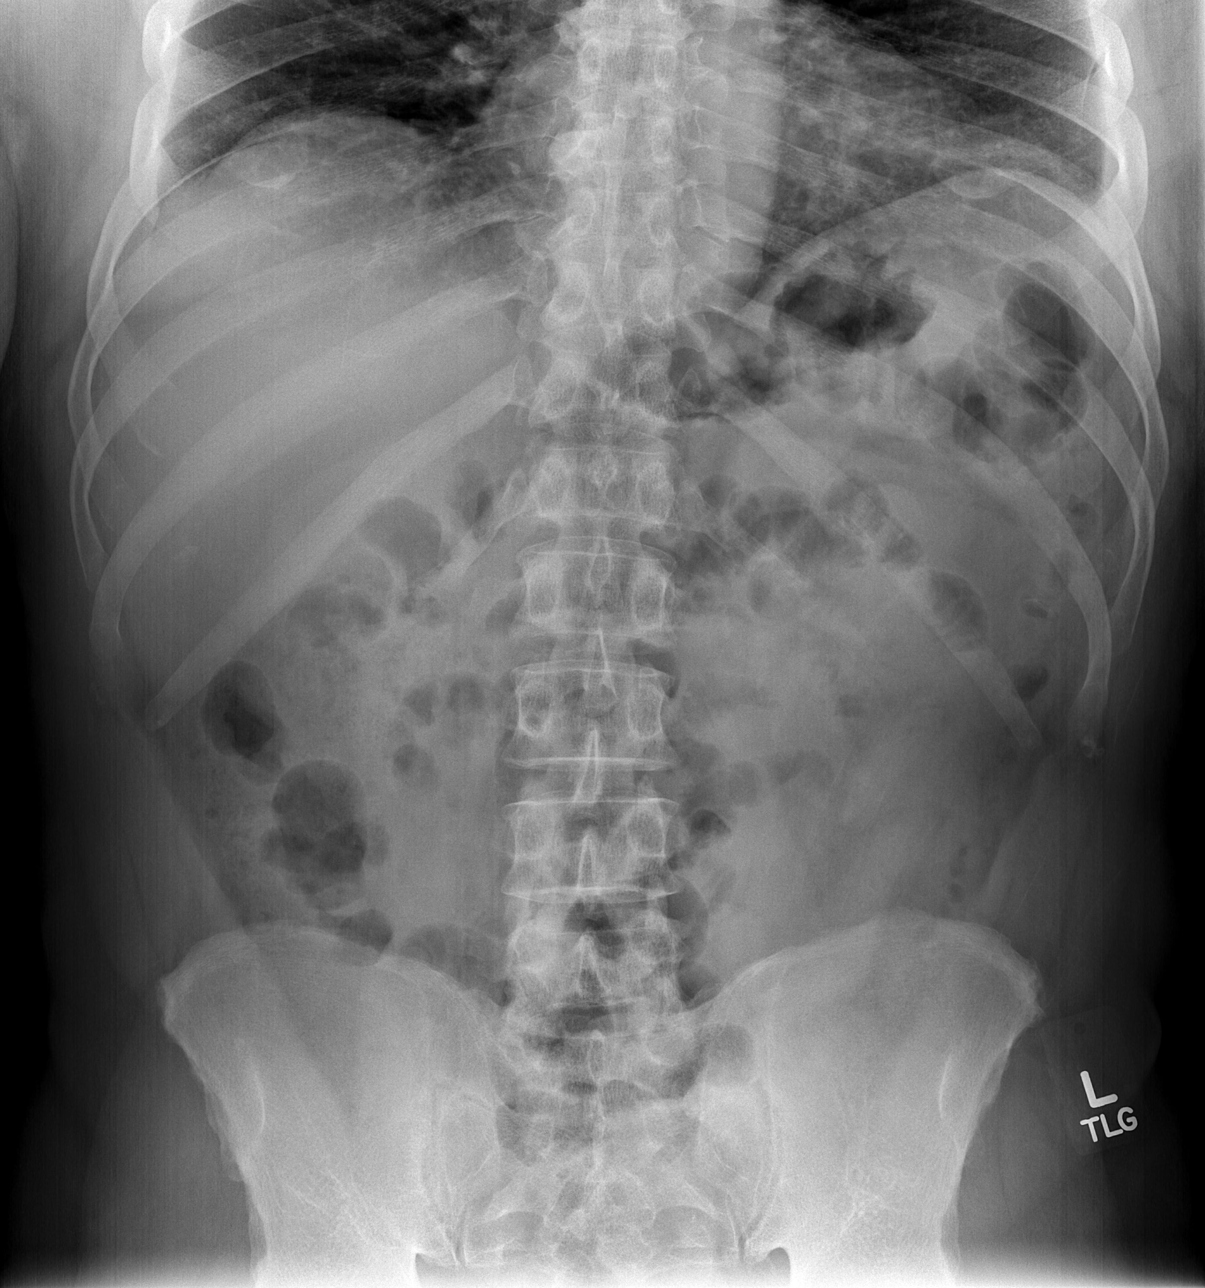

[t abdomen supine]
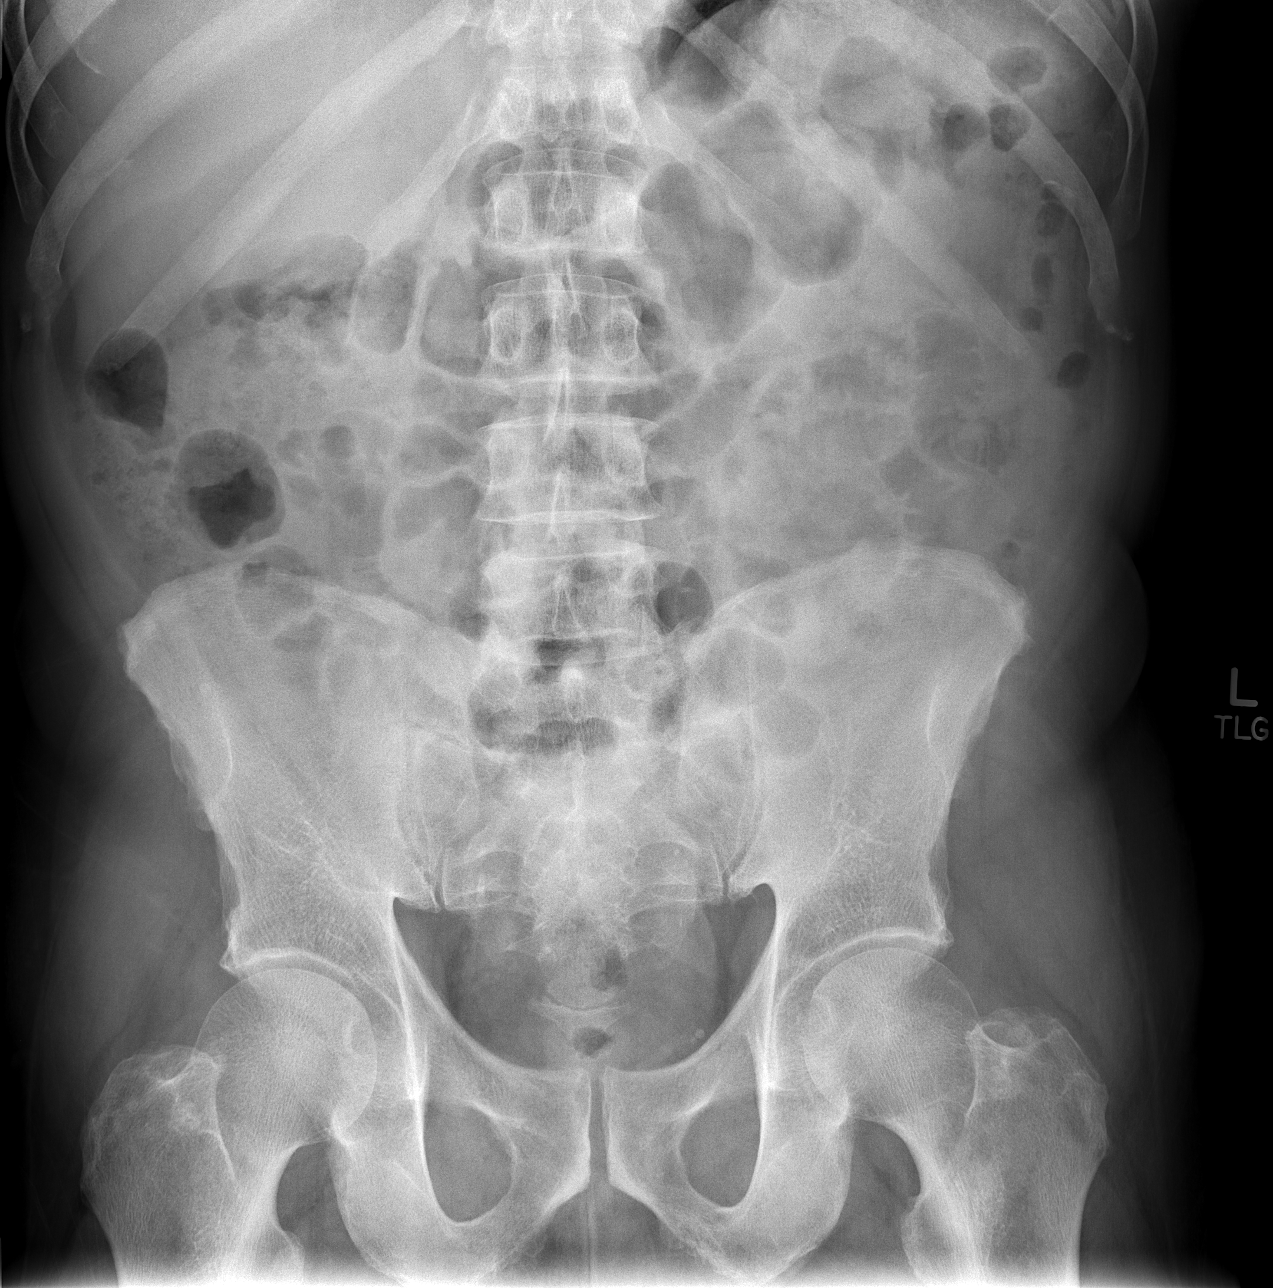

[3 of 3 positions shown; findings below may reference images not displayed]

FINDINGS: Slightly shallow inspiration. Normal heart size and
pulmonary vascularity.  No focal consolidation in the lungs.  No
blunting of costophrenic angles.

Scattered gas and stool in the colon.  No small or large bowel
dilatation.  No free intra-abdominal air.  No abnormal air fluid
levels.  No radiopaque stones.  Mild degenerative changes in the
spine.
IMPRESSION: No evidence of active pulmonary disease.  Nonobstructive bowel gas
pattern.

## 2015-06-02 ENCOUNTER — Other Ambulatory Visit: Payer: Self-pay

## 2015-06-02 MED ORDER — BENAZEPRIL HCL 10 MG PO TABS: 10.0000 mg | ORAL_TABLET | ORAL | Status: DC

## 2015-06-02 MED ORDER — BENAZEPRIL HCL 10 MG PO TABS: 10.0000 mg | ORAL_TABLET | Freq: Every day | ORAL | Status: DC

## 2015-06-04 ENCOUNTER — Encounter: Payer: Self-pay | Admitting: Family Medicine

## 2015-06-04 ENCOUNTER — Ambulatory Visit (INDEPENDENT_AMBULATORY_CARE_PROVIDER_SITE_OTHER): Payer: Medicare Other | Admitting: Family Medicine

## 2015-06-04 VITALS — BP 120/68 | HR 73 | Temp 97.8°F | Resp 16 | Ht 66.0 in | Wt 182.6 lb

## 2015-06-04 DIAGNOSIS — E038 Other specified hypothyroidism: Secondary | ICD-10-CM

## 2015-06-04 DIAGNOSIS — E049 Nontoxic goiter, unspecified: Secondary | ICD-10-CM

## 2015-06-04 DIAGNOSIS — E040 Nontoxic diffuse goiter: Secondary | ICD-10-CM | POA: Insufficient documentation

## 2015-06-04 DIAGNOSIS — R7989 Other specified abnormal findings of blood chemistry: Secondary | ICD-10-CM | POA: Insufficient documentation

## 2015-06-04 NOTE — Patient Instructions (Signed)
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 Repeat his thyroid panel on today as he had an ultrasound about a year ago and not repeated unless the endocrinologist thinks it is indicated

## 2015-06-04 NOTE — Progress Notes (Signed)
Name: Dennis Ford   MRN: [id number]    DOB: 09-04-1941   Date:06/04/2015       Progress Note  Subjective  Chief Complaint  Chief Complaint  Patient presents with  . Abnormal Lab    Pt had abnormal TSH and was instructed to return for further testing    Thyroid Problem Presents for initial visit. Patient reports no anxiety, constipation, diarrhea, dry skin, fatigue, heat intolerance, leg swelling, palpitations, tremors, weight gain or weight loss. The symptoms have been stable. Past treatments include nothing. The following procedures have not been performed: radioiodine uptake scan, thyroid FNA, thyroid ultrasound and thyroidectomy. (Goiter)      Past Medical History  Diagnosis Date  . Hypertension   . History of inguinal hernia repair   . Goiter   . Diabetes mellitus without complication   . Hyperlipidemia     History  Substance Use Topics  . Smoking status: Former Smoker    Quit date: 11/22/1988  . Smokeless tobacco: Not on file  . Alcohol Use: No     Current outpatient prescriptions:  .  acetaminophen (TYLENOL) 500 MG tablet, Take 1,000 mg by mouth every 6 (six) hours as needed. Usually only needs once per day   For pain , Disp: , Rfl:  .  allopurinol (ZYLOPRIM) 100 MG tablet, Take 100 mg by mouth daily.  , Disp: , Rfl:  .  benazepril (LOTENSIN) 10 MG tablet, Take 1 tablet (10 mg total) by mouth daily., Disp: 30 tablet, Rfl: 0 .  HYDROcodone-acetaminophen (VICODIN) 5-500 MG per tablet, Ad lib., Disp: , Rfl:   No Known Allergies  Review of Systems  Constitutional: Negative for fever, chills, weight loss, weight gain and fatigue.  HENT: Negative for congestion, hearing loss, sore throat and tinnitus.   Eyes: Negative for blurred vision, double vision and redness.  Respiratory: Positive for stridor. Negative for cough, hemoptysis and shortness of breath.   Cardiovascular: Negative for chest pain, palpitations, orthopnea, claudication and leg swelling.   Gastrointestinal: Negative for heartburn, nausea, vomiting, diarrhea, constipation and blood in stool.       No dysphagia  Genitourinary: Negative for dysuria, urgency, frequency and hematuria.  Musculoskeletal: Negative for myalgias, back pain, joint pain, falls and neck pain.  Skin: Negative for itching.  Neurological: Negative for dizziness, tingling, tremors, focal weakness, seizures, loss of consciousness, weakness and headaches.  Endo/Heme/Allergies: Negative for heat intolerance. Does not bruise/bleed easily.  Psychiatric/Behavioral: Negative for depression and substance abuse. The patient is not nervous/anxious and does not have insomnia.      Objective  Filed Vitals:   06/04/15 1029  BP: 120/68  Pulse: 73  Temp: 97.8 F (36.6 C)  Resp: 16  Height: 5\' 6"  (1.676 m)  Weight: 182 lb 9 oz (82.81 kg)  SpO2: 96%     Physical Exam  Constitutional: He is oriented to person, place, and time and well-developed, well-nourished, and in no distress.  HENT:  Head: Normocephalic.  Eyes: EOM are normal. Pupils are equal, round, and reactive to light.  Neck: Normal range of motion. Neck supple. No JVD present. No tracheal deviation present. Thyromegaly present.  Cardiovascular: Normal rate, regular rhythm and normal heart sounds.   No murmur heard. Pulmonary/Chest: Effort normal and breath sounds normal. No stridor. No respiratory distress. He has no wheezes.  Musculoskeletal: Normal range of motion. He exhibits no edema.  Lymphadenopathy:    He has no cervical adenopathy.  Neurological: He is alert and oriented to person, place, and  time. No cranial nerve deficit. Gait normal. Coordination normal.  Skin: Skin is warm and dry. No rash noted.  Psychiatric: Affect and judgment normal.      Assessment & Plan

## 2015-06-05 LAB — T3 UPTAKE: T3 UPTAKE RATIO: 28 % (ref 24–39)

## 2015-06-05 LAB — THYROID ANTIBODIES
Thyroglobulin Antibody: 1594 IU/mL — ABNORMAL HIGH (ref 0.0–0.9)
Thyroperoxidase Ab SerPl-aCnc: 12 IU/mL (ref 0–34)

## 2015-06-05 LAB — TSH: TSH: 3.41 u[IU]/mL (ref 0.450–4.500)

## 2015-07-21 ENCOUNTER — Other Ambulatory Visit: Payer: Self-pay | Admitting: Family Medicine

## 2015-07-24 ENCOUNTER — Other Ambulatory Visit: Payer: Self-pay | Admitting: Family Medicine

## 2015-08-21 ENCOUNTER — Other Ambulatory Visit: Payer: Self-pay | Admitting: Family Medicine

## 2015-09-21 ENCOUNTER — Other Ambulatory Visit: Payer: Self-pay | Admitting: Family Medicine

## 2015-10-07 ENCOUNTER — Ambulatory Visit (INDEPENDENT_AMBULATORY_CARE_PROVIDER_SITE_OTHER): Payer: Medicare Other | Admitting: Family Medicine

## 2015-10-07 ENCOUNTER — Encounter: Payer: Self-pay | Admitting: Family Medicine

## 2015-10-07 VITALS — BP 136/70 | HR 94 | Temp 99.0°F | Resp 18 | Ht 66.0 in | Wt 188.4 lb

## 2015-10-07 DIAGNOSIS — E049 Nontoxic goiter, unspecified: Secondary | ICD-10-CM | POA: Diagnosis not present

## 2015-10-07 DIAGNOSIS — E040 Nontoxic diffuse goiter: Secondary | ICD-10-CM

## 2015-10-07 DIAGNOSIS — E038 Other specified hypothyroidism: Secondary | ICD-10-CM | POA: Diagnosis not present

## 2015-10-07 DIAGNOSIS — M1000 Idiopathic gout, unspecified site: Secondary | ICD-10-CM | POA: Diagnosis not present

## 2015-10-07 NOTE — Progress Notes (Addendum)
Name: Dennis Ford   MRN: [id number]    DOB: 1941-08-25   Date:10/07/2015       Progress Note  Subjective  Chief Complaint  Chief Complaint  Patient presents with  . Hypertension  . Hyperlipidemia  . Diabetes  . Thyroid Problem    HPI  Hypertension   Patient presents for follow-up of hypertension. It has been present for over 5 years.  Patient states that there is compliance with medical regimen which consists of Lotensin 10 mg daily . There is no end organ disease. Cardiac risk factors include hypertension hyperlipidemia and diabetes.  Exercise regimen consist of some walking .  Diet consist of salt restriction  Hyperlipidemia  Patient has a history of hyperlipidemia for over 5 years.  Current medical regimen consist of diet and exercise only .  Compliance is intermittent .  Diet and exercise are currently followed intermittently .  Risk factors for cardiovascular disease include hyperlipidemia hypertension and age .   There have been no side effects from the medication.   .  Gout  Patient has a history of gout for over 5 years. Many tests mainly by pain in the right great toe. He is currently on allopurinol 300 mg daily. During flares he also uses Vicodin on occasion.    Past Medical History  Diagnosis Date  . Hypertension   . History of inguinal hernia repair   . Goiter   . Diabetes mellitus without complication (HCC)   . Hyperlipidemia     Social History  Substance Use Topics  . Smoking status: Former Smoker    Quit date: 11/22/1988  . Smokeless tobacco: Not on file  . Alcohol Use: No     Current outpatient prescriptions:  .  acetaminophen (TYLENOL) 500 MG tablet, Take 1,000 mg by mouth every 6 (six) hours as needed. Usually only needs once per day   For pain , Disp: , Rfl:  .  allopurinol (ZYLOPRIM) 300 MG tablet, Take 300 mg by mouth daily., Disp: , Rfl: 1 .  benazepril (LOTENSIN) 10 MG tablet, TAKE 1 TABLET BY MOUTH EVERY OTHER DAY, Disp: 90 tablet, Rfl: 1 .   benazepril (LOTENSIN) 10 MG tablet, TAKE 1 TABLET (10 MG TOTAL) BY MOUTH DAILY., Disp: 30 tablet, Rfl: 0 .  HYDROcodone-acetaminophen (VICODIN) 5-500 MG per tablet, Ad lib., Disp: , Rfl:   No Known Allergies  Review of Systems  Constitutional: Negative for fever, chills and weight loss.  HENT: Negative for congestion, hearing loss, sore throat and tinnitus.   Eyes: Negative for blurred vision, double vision and redness.  Respiratory: Negative for cough, hemoptysis and shortness of breath.   Cardiovascular: Negative for chest pain, palpitations, orthopnea, claudication and leg swelling.  Gastrointestinal: Negative for heartburn, nausea, vomiting, diarrhea, constipation and blood in stool.  Genitourinary: Negative for dysuria, urgency, frequency and hematuria.  Musculoskeletal: Positive for joint pain. Negative for myalgias, back pain, falls and neck pain.  Skin: Negative for itching.  Neurological: Negative for dizziness, tingling, tremors, focal weakness, seizures, loss of consciousness, weakness and headaches.  Endo/Heme/Allergies: Does not bruise/bleed easily.  Psychiatric/Behavioral: Negative for depression and substance abuse. The patient is not nervous/anxious and does not have insomnia.         Objective  Filed Vitals:   10/07/15 1039  BP: 136/70  Pulse: 94  Temp: 99 F (37.2 C)  Resp: 18  Height:  (1.676 m)  Weight: 188 lb 7 oz (85.475 kg)  SpO2: 97%     Physical  Exam  Constitutional: He is oriented to person, place, and time and well-developed, well-nourished, and in no distress.  HENT:  Head: Normocephalic.  Eyes: EOM are normal. Pupils are equal, round, and reactive to light.  Neck: Normal range of motion. Neck supple. No thyromegaly present.  Cardiovascular: Normal rate, regular rhythm and normal heart sounds.   No murmur heard. Pulmonary/Chest: Effort normal and breath sounds normal. No respiratory distress. He has no wheezes.  Abdominal: Soft. Bowel  sounds are normal.  Musculoskeletal: Normal range of motion. He exhibits no edema.  Lymphadenopathy:    He has no cervical adenopathy.  Neurological: He is alert and oriented to person, place, and time. No cranial nerve deficit. Gait normal. Coordination normal.  Skin: Skin is warm and dry. No rash noted.  Psychiatric: Affect and judgment normal.      Assessment & Plan   1. Goiter Stable - TSH  2. Goiter diffuse Stable  3. Other specified hypothyroidism Lab - TSH  4. Idiopathic gout, unspecified chronicity, unspecified site Stable

## 2015-10-18 ENCOUNTER — Other Ambulatory Visit: Payer: Self-pay | Admitting: Family Medicine

## 2015-10-26 ENCOUNTER — Other Ambulatory Visit: Payer: Self-pay | Admitting: Family Medicine

## 2015-11-06 LAB — TSH: TSH: 4.89 u[IU]/mL — AB (ref 0.450–4.500)

## 2015-11-09 ENCOUNTER — Telehealth: Payer: Self-pay | Admitting: Emergency Medicine

## 2015-11-09 NOTE — Telephone Encounter (Signed)
Patient notified of lab results. Patient stated that he had [facility] with [facility] and never got results. [facility] and spoke to [place] and patietn never showed up for new patient appointment. Called hospital Xray and no results was on file since 2013. Patient notified to make appointment to follow up.

## 2015-12-21 NOTE — Addendum Note (Signed)
Addended by: Dennison Mascot on: 12/21/2015 08:56 AM   Modules accepted: Level of Service

## 2015-12-28 ENCOUNTER — Other Ambulatory Visit: Payer: Self-pay | Admitting: Family Medicine

## 2016-02-01 ENCOUNTER — Other Ambulatory Visit: Payer: Self-pay | Admitting: Family Medicine

## 2016-02-28 ENCOUNTER — Telehealth: Payer: Self-pay | Admitting: Family Medicine

## 2016-02-28 MED ORDER — BENAZEPRIL HCL 10 MG PO TABS: 10.0000 mg | ORAL_TABLET | ORAL | Status: DC

## 2016-02-28 NOTE — Telephone Encounter (Signed)
Refill has been sent to pt pharmacy 

## 2016-02-28 NOTE — Telephone Encounter (Signed)
Requesting refill on Benazepril. [facility] send to [facility] [address]

## 2016-02-28 NOTE — Telephone Encounter (Signed)
Patient informed that prescription has been sent to pharmacy 

## 2016-03-09 ENCOUNTER — Telehealth: Payer: Self-pay | Admitting: Family Medicine

## 2016-03-09 NOTE — Telephone Encounter (Signed)
Pt would like a call back about his Lisinopril.

## 2016-05-08 ENCOUNTER — Telehealth: Payer: Self-pay | Admitting: Family Medicine

## 2016-05-08 NOTE — Telephone Encounter (Signed)
Dr Thana AtesMorrisey patient: has scheduled appointment with you for 05-31-16. He will need a refill on Allopurinol 300 mg and Benazepril 10 mg. Patient states that normally he takes Benazepril once daily but on his last refill someone switched it to every other day. He is asking for it to be switched back to once daily. As[facility] both [facility] [address]

## 2016-05-08 NOTE — Telephone Encounter (Signed)
Who is this patient following up with?

## 2016-05-11 ENCOUNTER — Other Ambulatory Visit: Payer: Self-pay | Admitting: Family Medicine

## 2016-05-31 ENCOUNTER — Ambulatory Visit (INDEPENDENT_AMBULATORY_CARE_PROVIDER_SITE_OTHER): Payer: Medicare Other | Admitting: Family Medicine

## 2016-05-31 ENCOUNTER — Other Ambulatory Visit: Payer: Self-pay | Admitting: Family Medicine

## 2016-05-31 ENCOUNTER — Encounter: Payer: Self-pay | Admitting: Family Medicine

## 2016-05-31 VITALS — BP 140/82 | HR 94 | Temp 98.1°F | Resp 18 | Ht 66.0 in | Wt 184.1 lb

## 2016-05-31 DIAGNOSIS — E038 Other specified hypothyroidism: Secondary | ICD-10-CM | POA: Diagnosis not present

## 2016-05-31 DIAGNOSIS — L309 Dermatitis, unspecified: Secondary | ICD-10-CM | POA: Diagnosis not present

## 2016-05-31 DIAGNOSIS — Z79899 Other long term (current) drug therapy: Secondary | ICD-10-CM

## 2016-05-31 DIAGNOSIS — I10 Essential (primary) hypertension: Secondary | ICD-10-CM | POA: Diagnosis not present

## 2016-05-31 DIAGNOSIS — M109 Gout, unspecified: Secondary | ICD-10-CM

## 2016-05-31 DIAGNOSIS — Z1211 Encounter for screening for malignant neoplasm of colon: Secondary | ICD-10-CM

## 2016-05-31 DIAGNOSIS — Z23 Encounter for immunization: Secondary | ICD-10-CM

## 2016-05-31 LAB — COMPLETE METABOLIC PANEL WITH GFR
ALBUMIN: 4.1 g/dL (ref 3.6–5.1)
ALT: 14 U/L (ref 9–46)
AST: 19 U/L (ref 10–35)
Alkaline Phosphatase: 53 U/L (ref 40–115)
BILIRUBIN TOTAL: 0.4 mg/dL (ref 0.2–1.2)
BUN: 11 mg/dL (ref 7–25)
CALCIUM: 9.3 mg/dL (ref 8.6–10.3)
CHLORIDE: 105 mmol/L (ref 98–110)
CO2: 24 mmol/L (ref 20–31)
CREATININE: 1.07 mg/dL (ref 0.70–1.18)
GFR, EST AFRICAN AMERICAN: 78 mL/min (ref >=60)
GFR, Est Non African American: 68 mL/min (ref >=60)
Glucose, Bld: 101 mg/dL — ABNORMAL HIGH (ref 65–99)
Potassium: 4.5 mmol/L (ref 3.5–5.3)
Sodium: 139 mmol/L (ref 135–146)
TOTAL PROTEIN: 7.6 g/dL (ref 6.1–8.1)

## 2016-05-31 LAB — TSH: TSH: 4.67 m[IU]/L — AB (ref 0.40–4.50)

## 2016-05-31 MED ORDER — TRIAMCINOLONE ACETONIDE 0.025 % EX CREA: 1.0000 "application " | TOPICAL_CREAM | Freq: Two times a day (BID) | CUTANEOUS | 1 refills | Status: DC

## 2016-05-31 MED ORDER — BENAZEPRIL HCL 10 MG PO TABS: 10.0000 mg | ORAL_TABLET | Freq: Every day | ORAL | 1 refills | Status: DC

## 2016-05-31 MED ORDER — ALLOPURINOL 300 MG PO TABS: 300.0000 mg | ORAL_TABLET | Freq: Every day | ORAL | 1 refills | Status: DC

## 2016-05-31 NOTE — Progress Notes (Signed)
Name: Dennis Ford   MRN: [id number]    DOB: 11/25/1940   Date:05/31/2016       Progress Note  Subjective  Chief Complaint  Chief Complaint  Patient presents with  . Medication Refill    pt here for med refills for HTN and chol.medications    HPI  HTN: taking medication as prescribed, he gets bp checked at home occasionally and it is usually 120's/140's - 80's. No chest pain, palpitation or SOB  Goiter: had thyroid US, labs abnormal last year, but never went to Endo. He denies constipation or weight gain. No fatigue.  Gout: he has ben on Allopurinol and has been symptom free for years.   Patient Active Problem List   Diagnosis Date Noted  . Hypertension, benign 05/31/2016  . 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 Other specified hypothyroidism 06/04/2015    Past Surgical History:  Procedure Laterality Date  . COLONOSCOPY    . HERNIA REPAIR     [facility]   . INGUINAL HERNIA REPAIR  11/27/2011   Procedure: HERNIA REPAIR INGUINAL ADULT;  Surgeon: Jetty Duhamel, MD;  Location: [facility];  Service: General;  Laterality: Right;  right inguinal hernia repair with mesh    Family History  Problem Relation Age of Onset  . Breast cancer Mother     Social History   Social History  . Marital status: Married    Spouse name: N/A  . Number of children: N/A  . Years of education: N/A   Occupational History  . Not on file.   Social History Main Topics  . Smoking status: Former Smoker    Quit date: 11/22/1988  . Smokeless tobacco: Never Used  . Alcohol use No  . Drug use: No  . Sexual activity: Not on file   Other Topics Concern  . Not on file   Social History Narrative  . No narrative on file     Current Outpatient Prescriptions:  .  acetaminophen (TYLENOL) 500 MG tablet, Take 1,000 mg by mouth every 6 (six) hours as needed. Usually only needs once per day   For pain , Disp: , Rfl:  .  allopurinol (ZYLOPRIM) 300 MG tablet, Take 1 tablet (300 mg total) by mouth  daily., Disp: 90 tablet, Rfl: 1 .  benazepril (LOTENSIN) 10 MG tablet, Take 1 tablet (10 mg total) by mouth daily., Disp: 90 tablet, Rfl: 1  No Known Allergies   ROS  Constitutional: Negative for fever or weight change.  Respiratory: Negative for cough and shortness of breath.   Cardiovascular: Negative for chest pain or palpitations.  Gastrointestinal: Negative for abdominal pain, no bowel changes.  Musculoskeletal: Negative for gait problem or joint swelling.  Skin: Positive  for rash on both arms ( sun exposed area), uses vaseline and usually clears by itself but he has used topical steroid and would like a  refill.  Neurological: Negative for dizziness or headache.  No other specific complaints in a complete review of systems (except as listed in HPI above).  Objective  Vitals:   05/31/16 1017  BP: 140/82  Pulse: 94  Resp: 18  Temp: 98.1 F (36.7 C)  SpO2: 97%  Weight: 184 lb 1 oz (83.5 kg)  Height: 5\' 6"  (1.676 m)    Body mass index is 29.71 kg/m.  Physical Exam  Constitutional: Patient appears well-developed and well-nourished.  No distress.  HEENT: head atraumatic, normocephalic, pupils equal and reactive to light,  neck supple, throat within normal limits  Cardiovascular: Normal rate, regular rhythm and normal heart sounds.  No murmur heard. No BLE edema. Pulmonary/Chest: Effort normal and breath sounds normal. No respiratory distress. Abdominal: Soft.  There is no tenderness. Psychiatric: Patient has a normal mood and affect. behavior is normal. Judgment and thought content normal. Skin: some hyperpigmentation and eczematous patches on both arms  PHQ2/9: Depression screen [facility] 2/9 05/31/2016 10/07/2015 06/04/2015  Decreased Interest 0 0 0  Down, Depressed, Hopeless 0 0 0  PHQ - 2 Score 0 0 0     Fall Risk: Fall Risk  05/31/2016 10/07/2015 06/04/2015  Falls in the past year? No No No     Functional Status Survey: Is the patient deaf or have difficulty  hearing?: No Does the patient have difficulty seeing, even when wearing glasses/contacts?: No Does the patient have difficulty concentrating, remembering, or making decisions?: No Does the patient have difficulty walking or climbing stairs?: No Does the patient have difficulty dressing or bathing?: No Does the patient have difficulty doing errands alone such as visiting a doctor's office or shopping?: No    Assessment & Plan  1. Hypertension, benign  - COMPLETE METABOLIC PANEL WITH GFR  - benazepril (LOTENSIN) 10 MG tablet; Take 1 tablet (10 mg total) by mouth daily.  Dispense: 90 tablet; Refill:   2. Other specified hypothyroidism  - benazepril (LOTENSIN) 10 MG tablet; Take 1 tablet (10 mg total) by mouth daily.  Dispense: 90 tablet; Refill: 1 - TSH  3. Controlled gout  - allopurinol (ZYLOPRIM) 300 MG tablet; Take 1 tablet (300 mg total) by mouth daily.  Dispense: 90 tablet; Refill: 1 - Uric acid  4. Need for pneumococcal vaccination  -PCV 13 today   5. Colon cancer screening  - POC Hemoccult Bld/Stl (3-Cd Home Screen); Future  6. Encounter for long-term (current) use of high-risk medication  - COMPLETE METABOLIC PANEL WITH GFR - CBC with Differential/Platelet  7. Dermatitis  - triamcinolone (KENALOG) 0.025 % cream; Apply 1 application topically 2 (two) times daily.  Dispense: 30 g; Refill: 1

## 2016-06-01 ENCOUNTER — Other Ambulatory Visit: Payer: Self-pay | Admitting: Family Medicine

## 2016-06-01 DIAGNOSIS — E049 Nontoxic goiter, unspecified: Secondary | ICD-10-CM

## 2016-06-01 DIAGNOSIS — E038 Other specified hypothyroidism: Secondary | ICD-10-CM

## 2016-06-01 DIAGNOSIS — R739 Hyperglycemia, unspecified: Secondary | ICD-10-CM

## 2016-06-01 LAB — CBC WITH DIFFERENTIAL/PLATELET
BASOS PCT: 1 %
Basophils Absolute: 41 cells/uL (ref 0–200)
EOS PCT: 2 %
Eosinophils Absolute: 82 cells/uL (ref 15–500)
HCT: 52.3 % — ABNORMAL HIGH (ref 38.5–50.0)
Hemoglobin: 16.8 g/dL (ref 13.2–17.1)
LYMPHS ABS: 1763 {cells}/uL (ref 850–3900)
Lymphocytes Relative: 43 %
MCH: 28.4 pg (ref 27.0–33.0)
MCHC: 32.1 g/dL (ref 32.0–36.0)
MCV: 88.3 fL (ref 80.0–100.0)
MONOS PCT: 10 %
MPV: 9.9 fL (ref 7.5–12.5)
Monocytes Absolute: 410 cells/uL (ref 200–950)
NEUTROS ABS: 1804 {cells}/uL (ref 1500–7800)
Neutrophils Relative %: 44 %
PLATELETS: 289 10*3/uL (ref 140–400)
RBC: 5.92 MIL/uL — AB (ref 4.20–5.80)
RDW: 16.2 % — ABNORMAL HIGH (ref 11.0–15.0)
WBC: 4.1 10*3/uL (ref 3.8–10.8)

## 2016-06-01 LAB — URIC ACID: URIC ACID, SERUM: 3.3 mg/dL — AB (ref 4.0–8.0)

## 2016-06-02 LAB — ALT

## 2016-06-02 NOTE — Telephone Encounter (Signed)
Patient followed up with Dr. Carlynn Purl

## 2016-06-06 LAB — HEMOGLOBIN A1C
HEMOGLOBIN A1C: 5.7 % — AB (ref <5.7)
Mean Plasma Glucose: 117 mg/dL

## 2016-07-19 ENCOUNTER — Other Ambulatory Visit: Payer: Self-pay

## 2016-07-19 DIAGNOSIS — Z1211 Encounter for screening for malignant neoplasm of colon: Secondary | ICD-10-CM

## 2016-07-19 LAB — POC HEMOCCULT BLD/STL (HOME/3-CARD/SCREEN)
Card #3 Fecal Occult Blood, POC: NEGATIVE
FECAL OCCULT BLD: NEGATIVE
Fecal Occult Blood, POC: NEGATIVE

## 2016-12-01 ENCOUNTER — Ambulatory Visit: Payer: Medicare Other | Admitting: Family Medicine

## 2016-12-11 ENCOUNTER — Other Ambulatory Visit: Payer: Self-pay | Admitting: Family Medicine

## 2016-12-11 DIAGNOSIS — E038 Other specified hypothyroidism: Secondary | ICD-10-CM

## 2016-12-11 MED ORDER — BENAZEPRIL HCL 10 MG PO TABS: 10.0000 mg | ORAL_TABLET | Freq: Every day | ORAL | 0 refills | Status: DC

## 2016-12-11 NOTE — Telephone Encounter (Signed)
Pt has appointment for 12/20/16. He is requesting a refill benazepril 10mg . Asking that [facility] to [facility] he is changing pharmacy [phone] or [phone]

## 2016-12-20 ENCOUNTER — Ambulatory Visit (INDEPENDENT_AMBULATORY_CARE_PROVIDER_SITE_OTHER): Payer: Medicare Other | Admitting: Family Medicine

## 2016-12-20 ENCOUNTER — Encounter: Payer: Self-pay | Admitting: Family Medicine

## 2016-12-20 VITALS — BP 180/100 | HR 96 | Temp 97.9°F | Resp 16 | Ht 66.0 in | Wt 184.9 lb

## 2016-12-20 DIAGNOSIS — R739 Hyperglycemia, unspecified: Secondary | ICD-10-CM

## 2016-12-20 DIAGNOSIS — E038 Other specified hypothyroidism: Secondary | ICD-10-CM

## 2016-12-20 DIAGNOSIS — R0683 Snoring: Secondary | ICD-10-CM

## 2016-12-20 DIAGNOSIS — M545 Low back pain, unspecified: Secondary | ICD-10-CM | POA: Insufficient documentation

## 2016-12-20 DIAGNOSIS — Z23 Encounter for immunization: Secondary | ICD-10-CM | POA: Diagnosis not present

## 2016-12-20 DIAGNOSIS — M109 Gout, unspecified: Secondary | ICD-10-CM

## 2016-12-20 DIAGNOSIS — I10 Essential (primary) hypertension: Secondary | ICD-10-CM

## 2016-12-20 DIAGNOSIS — L309 Dermatitis, unspecified: Secondary | ICD-10-CM

## 2016-12-20 LAB — POCT UA - MICROALBUMIN: MICROALBUMIN (UR) POC: 20 mg/L

## 2016-12-20 MED ORDER — IBUPROFEN 800 MG PO TABS: 800.0000 mg | ORAL_TABLET | Freq: Three times a day (TID) | ORAL | 0 refills | Status: DC | PRN

## 2016-12-20 MED ORDER — ASPIRIN EC 81 MG PO TBEC: 81.0000 mg | DELAYED_RELEASE_TABLET | Freq: Every day | ORAL | 0 refills | Status: DC

## 2016-12-20 MED ORDER — AMLODIPINE BESYLATE 5 MG PO TABS: 5.0000 mg | ORAL_TABLET | Freq: Every day | ORAL | 0 refills | Status: DC

## 2016-12-20 MED ORDER — TRIAMCINOLONE ACETONIDE 0.025 % EX CREA: 1.0000 "application " | TOPICAL_CREAM | Freq: Two times a day (BID) | CUTANEOUS | 1 refills | Status: DC

## 2016-12-20 NOTE — Progress Notes (Signed)
Name: Dennis MinRobert Barsch   MRN: [id number]    DOB: 02/02/1941   Date:12/20/2016       Progress Note  Subjective  Chief Complaint  Chief Complaint  Patient presents with  . Medication Refill    6 month F/U  . 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  . Gout    Has not had a flair up in several days, takes as needed  . Back Pain    Takes medication as need, but still very active and when moving around he gets in pain. Has tried Tylenol with no relief.  Pain is from back and hernia incision    HPI  HTN: taking medication as prescribed, he gets bp checked at pharmacy has been going up to 170's-180's, but goes down to 140's-150's. He states his life style has not changed, taking medication as prescribed, not sure why bp is up. He wakes up with a headache at times and snores during the night. The only change was that he quit drinking alcohol a couple of months ago. He was drinking one shot of bourbon daily   Goiter: had thyroid US, labs abnormal last year, but never went to Endo. He denies constipation or weight gain. No fatigue. Last TSH was elevated, but not on medication   Gout: he has ben taking Allopurinol prn and has been symptom free for years.  Advised him to stop taking since only taking it prn   Back pain intermittent: he is a Visual merchandiserfarmer and likes to garden to he was given Hydrocodone by Dr. Thana AtesMorrisey back in 2016 to take prn back pain. Pain is described as dull pain on lumbar spine and also on site of inguinal hernia repair. He states aggravated by gardening and movement of back ( like riding a tractor). Discussed risk and we will try Ibuprofen but needs to get bp under control first.     Patient Active Problem List   Diagnosis Date Noted  . Intermittent low back pain 12/20/2016  . Hyperglycemia 06/01/2016  . Hypertension, benign 05/31/2016  . Dermatitis 05/31/2016  . 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 Other  specified hypothyroidism 06/04/2015    Past Surgical History:  Procedure Laterality Date  . COLONOSCOPY    . CYST EXCISION Left 2008   Upper Back on Left Shoulder  . HERNIA REPAIR     [facility]   . INGUINAL HERNIA REPAIR  11/27/2011   Procedure: HERNIA REPAIR INGUINAL ADULT;  Surgeon: Jetty DuhamelJames O Wyatt III, MD;  Location: [facility];  Service: General;  Laterality: Right;  right inguinal hernia repair with mesh    Family History  Problem Relation Age of Onset  . Breast cancer Mother     Social History   Social History  . Marital status: Married    Spouse name: N/A  . Number of children: N/A  . Years of education: N/A   Occupational History  . Not on file.   Social History Main Topics  . Smoking status: Former Smoker    Types: Cigars    Quit date: 11/22/1988  . Smokeless tobacco: Never Used     Comment: Smokes off and on   . Alcohol use No  . Drug use: No  . Sexual activity: Yes    Partners: Female   Other Topics Concern  . Not on file   Social History Narrative  . No narrative on file  Current Outpatient Prescriptions:  .  benazepril (LOTENSIN) 10 MG tablet, Take 1 tablet (10 mg total) by mouth daily., Disp: 30 tablet, Rfl: 0 .  triamcinolone (KENALOG) 0.025 % cream, Apply 1 application topically 2 (two) times daily., Disp: 30 g, Rfl: 1 .  amLODipine (NORVASC) 5 MG tablet, Take 1 tablet (5 mg total) by mouth daily., Disp: 30 tablet, Rfl: 0 .  aspirin EC 81 MG tablet, Take 1 tablet (81 mg total) by mouth daily., Disp: 30 tablet, Rfl: 0 .  ibuprofen (ADVIL,MOTRIN) 800 MG tablet, Take 1 tablet (800 mg total) by mouth every 8 (eight) hours as needed., Disp: 30 tablet, Rfl: 0  No Known Allergies   ROS  Constitutional: Negative for fever or weight change.  Respiratory: Negative for cough and shortness of breath.   Cardiovascular: Negative for chest pain or palpitations.  Gastrointestinal: Negative for abdominal pain, no bowel changes.  Musculoskeletal:  Negative for gait problem or joint swelling.  Skin: Negative for rash.  Neurological: Negative for dizziness, positive for  Headache in am's at times.  No other specific complaints in a complete review of systems (except as listed in HPI above).  Objective  Vitals:   12/20/16 1113 12/20/16 1214  BP: (!) 170/100 (!) 180/100  Pulse: 96   Resp: 16   Temp: 97.9 F (36.6 C)   TempSrc: Oral   SpO2: 97%   Weight: 184 lb 14.4 oz (83.9 kg)   Height: 5\' 6"  (1.676 m)     Body mass index is 29.84 kg/m.  Physical Exam  Constitutional: Patient appears well-developed and well-nourished. Obese to light,  neck supple, throat within normal limits Cardiovascular: Normal rate, regular rhythm and normal heart sounds.  No murmur heard. No BLE edema. Pulmonary/Chest: Effort normal and breath sounds normal. No respiratory distress. Abdominal: Soft.  There is no tenderness. Psychiatric: Patient has a normal mood and affect. behavior is normal. Judgment and thought content normal.  PHQ2/9: Depression screen [facility] 2/9 12/20/2016 05/31/2016 10/07/2015 06/04/2015  Decreased Interest 0 0 0 0  Down, Depressed, Hopeless 0 0 0 0  PHQ - 2 Score 0 0 0 0     Fall Risk: Fall Risk  12/20/2016 05/31/2016 10/07/2015 06/04/2015  Falls in the past year? No No No No     Functional Status Survey: Is the patient deaf or have difficulty hearing?: No Does the patient have difficulty seeing, even when wearing glasses/contacts?: No Does the patient have difficulty concentrating, remembering, or making decisions?: No Does the patient have difficulty walking or climbing stairs?: No Does the patient have difficulty dressing or bathing?: No Does the patient have difficulty doing errands alone such as visiting a doctor's office or shopping?: No    Assessment & Plan  1. Uncontrolled hypertension  bp is very high, he wakes up with a headache and snores, sometimes naps during the day, we will recheck labs and urine micro  today. Add Norvasc and follow up in a couple of weeks - CBC with Differential/Platelet - COMPLETE METABOLIC PANEL WITH GFR - POCT UA - Microalbumin - amLODipine (NORVASC) 5 MG tablet; Take 1 tablet (5 mg total) by mouth daily.  Dispense: 30 tablet; Refill: 0  2. Intermittent low back pain  He has been taking Hydrocodone, last rx from 2016 90 pills, never tried NSAID's. Advised to hold off on filling Motrin until bp is better controlled, but should be fine as long as he takes it prn.  - ibuprofen (ADVIL,MOTRIN) 800 MG tablet; Take 1  tablet (800 mg total) by mouth every 8 (eight) hours as needed.  Dispense: 30 tablet; Refill: 0  3. Other specified hypothyroidism  Had [place] in the past we will get records from Dr. Patrecia Pace - TSH  4. Controlled gout  Stop Allopurinol only taking prn for flares, and not in over one year, last uric acid was low  5. Needs flu shot  refused  6. Dermatitis  - triamcinolone (KENALOG) 0.025 % cream; Apply 1 application topically 2 (two) times daily.  Dispense: 30 g; Refill: 1  7. Elevated blood sugar  - Hemoglobin A1c - Insulin, fasting   8. Snores  - Nocturnal polysomnography (NPSG); Future

## 2016-12-22 ENCOUNTER — Encounter: Payer: Self-pay | Admitting: Family Medicine

## 2017-01-09 ENCOUNTER — Encounter: Payer: Self-pay | Admitting: Family Medicine

## 2017-01-09 ENCOUNTER — Other Ambulatory Visit: Payer: Self-pay | Admitting: Family Medicine

## 2017-01-09 ENCOUNTER — Ambulatory Visit (INDEPENDENT_AMBULATORY_CARE_PROVIDER_SITE_OTHER): Payer: Medicare Other | Admitting: Family Medicine

## 2017-01-09 VITALS — BP 148/84 | HR 82 | Temp 98.3°F | Resp 16 | Ht 66.0 in | Wt 188.3 lb

## 2017-01-09 DIAGNOSIS — I10 Essential (primary) hypertension: Secondary | ICD-10-CM | POA: Diagnosis not present

## 2017-01-09 DIAGNOSIS — R739 Hyperglycemia, unspecified: Secondary | ICD-10-CM | POA: Diagnosis not present

## 2017-01-09 DIAGNOSIS — E038 Other specified hypothyroidism: Secondary | ICD-10-CM | POA: Diagnosis not present

## 2017-01-09 LAB — CBC WITH DIFFERENTIAL/PLATELET
BASOS PCT: 1 %
Basophils Absolute: 33 cells/uL (ref 0–200)
EOS ABS: 33 {cells}/uL (ref 15–500)
Eosinophils Relative: 1 %
HCT: 48.2 % (ref 38.5–50.0)
Hemoglobin: 15.8 g/dL (ref 13.2–17.1)
LYMPHS PCT: 38 %
Lymphs Abs: 1254 cells/uL (ref 850–3900)
MCH: 28.4 pg (ref 27.0–33.0)
MCHC: 32.8 g/dL (ref 32.0–36.0)
MCV: 86.7 fL (ref 80.0–100.0)
MONOS PCT: 10 %
MPV: 9.8 fL (ref 7.5–12.5)
Monocytes Absolute: 330 cells/uL (ref 200–950)
NEUTROS ABS: 1650 {cells}/uL (ref 1500–7800)
Neutrophils Relative %: 50 %
PLATELETS: 295 10*3/uL (ref 140–400)
RBC: 5.56 MIL/uL (ref 4.20–5.80)
RDW: 14.8 % (ref 11.0–15.0)
WBC: 3.3 10*3/uL — AB (ref 3.8–10.8)

## 2017-01-09 MED ORDER — BENAZEPRIL HCL 20 MG PO TABS: 20.0000 mg | ORAL_TABLET | Freq: Every day | ORAL | 0 refills | Status: DC

## 2017-01-09 NOTE — Progress Notes (Signed)
Name: Dennis MinRobert Gadison   MRN: [id number]    DOB: January 03, 1941   Date:01/09/2017       Progress Note  Subjective  Chief Complaint  Chief Complaint  Patient presents with  . Follow-up    2 week F/U  . Hypertension    Last visit added Norvasc to current regimen. States the medication is not helping and his throat was swelling and could not swallow. Pharmacist stated this was a allergic reaction, and quit taking medication. He took 1/2 pill and benadryl and still was not helping symptoms- so patient stopped taking.     HPI  HTN: he came in today for one month follow up, his last BP was out of controlled, we added Norvasc to Benazepril but he developed angioedema and stopped medication. He states that he was not very compliant with his bp medication prior to last visit, but has been doing well since. Taking medication daily, avoiding added salt, eating more vegetables, also more active in his garden. BP at home has been better controlled in the 140's range. No chest pain, palpitation or headaches.   Patient Active Problem List   Diagnosis Date Noted  . Intermittent low back pain 12/20/2016  . Hyperglycemia 06/01/2016  . Hypertension, benign 05/31/2016  . Dermatitis 05/31/2016  . 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 Other specified hypothyroidism 06/04/2015    Past Surgical History:  Procedure Laterality Date  . COLONOSCOPY    . CYST EXCISION Left 2008   Upper Back on Left Shoulder  . HERNIA REPAIR     [facility]   . INGUINAL HERNIA REPAIR  11/27/2011   Procedure: HERNIA REPAIR INGUINAL ADULT;  Surgeon: Jetty DuhamelJames O Wyatt III, MD;  Location: [facility];  Service: General;  Laterality: Right;  right inguinal hernia repair with mesh    Family History  Problem Relation Age of Onset  . Breast cancer Mother     Social History   Social History  . Marital status: Married    Spouse name: N/A  . Number of children: N/A  . Years of education: N/A   Occupational History  . Not on file.    Social History Main Topics  . Smoking status: Former Smoker    Types: Cigars    Quit date: 11/22/1988  . Smokeless tobacco: Never Used     Comment: Smokes off and on   . Alcohol use No     Comment: he quit 10/2016  . Drug use: No  . Sexual activity: Yes    Partners: Female   Other Topics Concern  . Not on file   Social History Narrative  . No narrative on file     Current Outpatient Prescriptions:  .  allopurinol (ZYLOPRIM) 300 MG tablet, Take 300 mg by mouth daily., Disp: , Rfl: 0 .  aspirin EC 81 MG tablet, Take 1 tablet (81 mg total) by mouth daily., Disp: 30 tablet, Rfl: 0 .  benazepril (LOTENSIN) 20 MG tablet, Take 1 tablet (20 mg total) by mouth daily., Disp: 90 tablet, Rfl: 0 .  ibuprofen (ADVIL,MOTRIN) 800 MG tablet, Take 1 tablet (800 mg total) by mouth every 8 (eight) hours as needed., Disp: 30 tablet, Rfl: 0 .  triamcinolone (KENALOG) 0.025 % cream, Apply 1 application topically 2 (two) times daily., Disp: 30 g, Rfl: 1  Allergies  Allergen Reactions  . Norvasc [Amlodipine] Other (See Comments)    angioedema     ROS  Ten systems reviewed and is negative except as mentioned in  HPI   Objective  Vitals:   01/09/17 1113  BP: (!) 148/84  Pulse: 82  Resp: 16  Temp: 98.3 F (36.8 C)  TempSrc: Oral  SpO2: 96%  Weight: 188 lb 4.8 oz (85.4 kg)  Height: 5\' 6"  (1.676 m)    Body mass index is 30.39 kg/m.  Physical Exam  Constitutional: Patient appears well-developed and well-nourished.No distress.  HEENT: head atraumatic, normocephalic, pupils equal and reactive to light,  neck supple, throat within normal limits Cardiovascular: Normal rate, regular rhythm and normal heart sounds.  No murmur heard. No BLE edema. Pulmonary/Chest: Effort normal and breath sounds normal. No respiratory distress. Abdominal: Soft.  There is no tenderness. Psychiatric: Patient has a normal mood and affect. behavior is normal. Judgment and thought content normal.  Recent  Results (from the past 2160 hour(s))  POCT UA - Microalbumin     Status: Normal   Collection Time: 12/20/16 12:43 PM  Result Value Ref Range   Microalbumin Ur, POC 20 mg/L   Creatinine, POC  mg/dL   Albumin/Creatinine Ratio, Urine, POC        PHQ2/9: Depression screen [facility] 2/9 12/20/2016 05/31/2016 10/07/2015 06/04/2015  Decreased Interest 0 0 0 0  Down, Depressed, Hopeless 0 0 0 0  PHQ - 2 Score 0 0 0 0     Fall Risk: Fall Risk  12/20/2016 05/31/2016 10/07/2015 06/04/2015  Falls in the past year? No No No No     Assessment & Plan  1. Essential hypertension  - benazepril (LOTENSIN) 20 MG tablet; Take 1 tablet (20 mg total) by mouth daily.  Dispense: 90 tablet; Refill: 0  He had to stop taking Norvasc secondary to angioedema, only taking Benazepril 10 mg but has been compliant and bp has improved, we will increase dose to 20 mg and he will keep a log at home, he would like to follow up in 3 months instead of in one month

## 2017-01-10 LAB — TSH: TSH: 4.89 m[IU]/L — AB (ref 0.40–4.50)

## 2017-01-10 LAB — COMPLETE METABOLIC PANEL WITH GFR
ALT: 10 U/L (ref 9–46)
AST: 14 U/L (ref 10–35)
Albumin: 3.9 g/dL (ref 3.6–5.1)
Alkaline Phosphatase: 47 U/L (ref 40–115)
BILIRUBIN TOTAL: 0.4 mg/dL (ref 0.2–1.2)
BUN: 9 mg/dL (ref 7–25)
CALCIUM: 9.3 mg/dL (ref 8.6–10.3)
CHLORIDE: 102 mmol/L (ref 98–110)
CO2: 30 mmol/L (ref 20–31)
CREATININE: 0.86 mg/dL (ref 0.70–1.18)
GFR, Est Non African American: 85 mL/min (ref >=60)
Glucose, Bld: 93 mg/dL (ref 65–99)
Potassium: 4 mmol/L (ref 3.5–5.3)
Sodium: 137 mmol/L (ref 135–146)
TOTAL PROTEIN: 7.3 g/dL (ref 6.1–8.1)

## 2017-01-10 LAB — INSULIN, FASTING: INSULIN FASTING, SERUM: 3.3 u[IU]/mL (ref 2.0–19.6)

## 2017-01-10 LAB — HEMOGLOBIN A1C
Hgb A1c MFr Bld: 5.6 % (ref <5.7)
MEAN PLASMA GLUCOSE: 114 mg/dL

## 2017-01-16 ENCOUNTER — Other Ambulatory Visit: Payer: Self-pay | Admitting: Family Medicine

## 2017-01-16 DIAGNOSIS — I10 Essential (primary) hypertension: Secondary | ICD-10-CM

## 2017-01-16 NOTE — Telephone Encounter (Signed)
Patient requesting refill of Amlodipine to [facility].  

## 2017-03-19 ENCOUNTER — Encounter: Payer: Self-pay | Admitting: Family Medicine

## 2017-03-19 ENCOUNTER — Ambulatory Visit (INDEPENDENT_AMBULATORY_CARE_PROVIDER_SITE_OTHER): Payer: Medicare Other | Admitting: Family Medicine

## 2017-03-19 VITALS — BP 134/84 | HR 92 | Temp 98.1°F | Resp 16 | Ht 66.0 in | Wt 185.1 lb

## 2017-03-19 DIAGNOSIS — R946 Abnormal results of thyroid function studies: Secondary | ICD-10-CM | POA: Diagnosis not present

## 2017-03-19 DIAGNOSIS — R7989 Other specified abnormal findings of blood chemistry: Secondary | ICD-10-CM

## 2017-03-19 DIAGNOSIS — L03011 Cellulitis of right finger: Secondary | ICD-10-CM

## 2017-03-19 MED ORDER — AMOXICILLIN-POT CLAVULANATE 875-125 MG PO TABS: 1.0000 | ORAL_TABLET | Freq: Two times a day (BID) | ORAL | 0 refills | Status: DC

## 2017-03-19 MED ORDER — HYDROCODONE-ACETAMINOPHEN 10-325 MG PO TABS: 1.0000 | ORAL_TABLET | Freq: Three times a day (TID) | ORAL | 0 refills | Status: DC | PRN

## 2017-03-19 MED ORDER — LIDOCAINE HCL (PF) 1 % IJ SOLN
2.0000 mL | Freq: Once | INTRAMUSCULAR | Status: AC
  Administered 2017-03-19: 2 mL via INTRADERMAL

## 2017-03-19 NOTE — Addendum Note (Signed)
Addended by: Alba CorySOWLES, Mervin Ramires F on: 03/19/2017 05:08 PM   Modules accepted: Level of Service

## 2017-03-19 NOTE — Patient Instructions (Signed)
Fingertip Infection There are two main types of fingertip infections:  Paronychia. This is an infection that happens around your nail. This type of infection can start suddenly in one nail or occur gradually over time and affect more than one nail. Long-term (chronic) paronychia can make your fingernails thick and deformed.  Felon. This is a bacterial infection in the padded tip of your finger. Felon infection can cause a painful collection of pus (abscess) to form inside your fingertip. If the infection is not treated, the infection can spread as deep as the bone. What are the causes? Paronychia infection can be caused by bacteria, funguses, or a mix of both. Felon infection is usually caused by the bacteria that are normally found on your skin. An infection can develop if the bacteria spread through your skin to the pad of tissue inside your fingertip. What increases the risk? A fingertip infection is more likely to develop in people:  Who have diabetes.  Who have a weak body defense (immune) system.  Who work with their hands.  Whose hands are exposed to moisture, chemicals, or irritants for long periods of time.  Who have poor circulation.  Who bite, chew, or pick their fingernails. What are the signs or symptoms? Symptoms of paronychia may affect one or more fingernails and include:  Pain, swelling, and redness around the nail.  Pus-filled pockets at the base or side of the fingernail (cuticle).  Thick fingernails that separate from the nail bed.  Pus that drains from the nail bed. Symptoms of felon usually affect just one fingertip pad and include:  Severe, throbbing pain.  Redness.  Swelling.  Warmth.  Tenderness when the affected fingertip is touched. How is this diagnosed? A fingertip infection is diagnosed with a medical history and physical exam. If there is pus draining from the infection, it may be swabbed and sent to the lab for a culture. An X-ray may be  done to see if the infection has spread to the bone. How is this treated? Treatment for a fingertip infection may include:  Warm water or salt-water soaks several times per day.  Antibiotic medicine. This may be an ointment or pills.  Steroid ointment.  Antifungal pills.  Drainage of pus pockets. This is done by making a surgical cut (incision) to open the fingertip to drain pus.  Wearing gloves to protect your nails. Follow these instructions at home: Medicines   Take or apply over-the-counter and prescription medicines only as told by your health care provider.  If you were prescribed antibiotic medicine, take or apply it as told by your health care provider. Do not stop using the antibiotic even if your condition improves. Wound care   Clean the infected area each day with warm water or salt water, or as told by your health care provider.  Gently wash the infected area with mild soap and water.  Rinse the infected area with water to remove all soap.  Pat the infected area dry with a clean towel. Do not rub it.  To make a salt-water mixture, completely dissolve -1 tsp of salt in 1 cup of warm water.  Follow instructions from your health care provider about:  How to take care of the infection.  When and how you should change your bandage (dressing).  When you should remove your dressing.  Check the infected area every day for more signs of infection. Watch for:  More redness, swelling, or pain.  More fluid or blood.  Warmth.    A bad smell. General instructions   Keep the dressing dry until your health care provider says it can be removed. Do not take baths, swim, use a hot tub, or do anything that would put your wound underwater until your health care provider approves.  Raise (elevate) the infected area above the level of your heart while you are sitting or lying down or as told by your health care provider.  Do not scratch or pick at the infection.  Wear  gloves as told by your health care provider, if this applies.  Keep all follow-up visits as told by your health care provider. This is important. How is this prevented?  Wear gloves when you work with your hands.  Wash your hands often with antibacterial soap.  Avoid letting your hands stay wet or irritated for long periods of time.  Do not bite your fingernails. Do not pull on your cuticles. Do not suck on your fingers.  Use clean scissors or nail clippers to trim your nails. Do not cut your fingernails very short. Contact a health care provider if:  Your pain medicine is not helping.  You have more redness, swelling, or pain at your fingertip.  You continue to have fluid, blood, or pus coming from your fingertip.  Your infection area feels warm to the touch.  You continue to notice a bad smell coming from your fingertip or your dressing. Get help right away if:  The area of redness is spreading, or you notice a red streak going away from your fingertip.  You have a fever. This information is not intended to replace advice given to you by your health care provider. Make sure you discuss any questions you have with your health care provider. Document Released: 11/30/2004 Document Revised: 03/30/2016 Document Reviewed: 04/12/2015 Elsevier Interactive Patient Education  2017 [employer] Inc[employer]  

## 2017-03-19 NOTE — Progress Notes (Signed)
Name: Dennis Ford   MRN: [id number]    DOB: 07/19/1941   Date:03/19/2017       Progress Note  Subjective  Chief Complaint  Chief Complaint  Patient presents with  . Hand Pain    Onset- Since Friday patient had a hanging nail on his right thumb, he went to pull it off and has hurt ever since. It is inflammed, swollen and yellow color around nail. Patient states it throbs constantly and can't sleep due to pain.    HPI  Abnormal TSH: it has been above normal for the past. He states he feels tired occasionally, usually associated with increase in work load. No constipation, dry skin or weight gain. Discussed sub-clinical hypothyroidism, we will recheck labs and he is not sure if he wants to be treated  Paronychia: developed swelling and pain on right thumb 3 days ago, pain is intense and affected his sleep, it is not very red but it was warmer than usual. It started after he removed a hung nail.   Patient Active Problem List   Diagnosis Date Noted  . Intermittent low back pain 12/20/2016  . Hyperglycemia 06/01/2016  . Hypertension, benign 05/31/2016  . Dermatitis 05/31/2016  . 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 Other specified hypothyroidism 06/04/2015    Past Surgical History:  Procedure Laterality Date  . COLONOSCOPY    . CYST EXCISION Left 2008   Upper Back on Left Shoulder  . HERNIA REPAIR     [facility]   . INGUINAL HERNIA REPAIR  11/27/2011   Procedure: HERNIA REPAIR INGUINAL ADULT;  Surgeon: Belva Crome, MD;  Location: [place];  Service: General;  Laterality: Right;  right inguinal hernia repair with mesh    Family History  Problem Relation Age of Onset  . Breast cancer Mother     Social History   Social History  . Marital status: Married    Spouse name: N/A  . Number of children: N/A  . Years of education: N/A   Occupational History  . Not on file.   Social History Main Topics  . Smoking status: Former Smoker    Types: Cigars    Quit date:  11/22/1988  . Smokeless tobacco: Never Used     Comment: Smokes off and on   . Alcohol use No     Comment: he quit 10/2016  . Drug use: No  . Sexual activity: Yes    Partners: Female   Other Topics Concern  . Not on file   Social History Narrative  . No narrative on file     Current Outpatient Prescriptions:  .  allopurinol (ZYLOPRIM) 300 MG tablet, Take 300 mg by mouth daily., Disp: , Rfl: 0 .  benazepril (LOTENSIN) 20 MG tablet, Take 1 tablet (20 mg total) by mouth daily., Disp: 90 tablet, Rfl: 0 .  ibuprofen (ADVIL,MOTRIN) 800 MG tablet, Take 1 tablet (800 mg total) by mouth every 8 (eight) hours as needed., Disp: 30 tablet, Rfl: 0 .  triamcinolone (KENALOG) 0.025 % cream, Apply 1 application topically 2 (two) times daily., Disp: 30 g, Rfl: 1 .  aspirin EC 81 MG tablet, Take 1 tablet (81 mg total) by mouth daily. (Patient not taking: Reported on 03/19/2017), Disp: 30 tablet, Rfl: 0 .  HYDROcodone-acetaminophen (NORCO) 10-325 MG tablet, Take 1 tablet by mouth every 8 (eight) hours as needed., Disp: 12 tablet, Rfl: 0  Allergies  Allergen Reactions  . Norvasc [Amlodipine] Other (See Comments)  angioedema     ROS  Ten systems reviewed and is negative except as mentioned in HPI   Objective  Vitals:   03/19/17 1428  BP: 134/84  Pulse: 92  Resp: 16  Temp: 98.1 F (36.7 C)  TempSrc: Oral  SpO2: 94%  Weight: 185 lb 1.6 oz (84 kg)  Height: 5' 6" (1.676 m)    Body mass index is 29.88 kg/m.  Physical Exam  Constitutional: Patient appears well-developed and well-nourished.  No distress.  Cardiovascular: Normal rate, regular rhythm and normal heart sounds.  No murmur heard. No BLE edema. Pulmonary/Chest: Effort normal and breath sounds normal. No respiratory distress. Psychiatric: Patient has a normal mood and affect. behavior is normal. Judgment and thought content normal. Skin: right thumb tender to touch with some erythema and fluctuance on lateral aspect of nail  bed  Recent Results (from the past 2160 hour(s))  POCT UA - Microalbumin     Status: Normal   Collection Time: 12/20/16 12:43 PM  Result Value Ref Range   Microalbumin Ur, POC 20 mg/L   Creatinine, POC  mg/dL   Albumin/Creatinine Ratio, Urine, POC    COMPLETE METABOLIC PANEL WITH GFR     Status: None   Collection Time: 01/09/17 12:18 PM  Result Value Ref Range   Sodium 137 135 - 146 mmol/L   Potassium 4.0 3.5 - 5.3 mmol/L   Chloride 102 98 - 110 mmol/L   CO2 30 20 - 31 mmol/L   Glucose, Bld 93 65 - 99 mg/dL   BUN 9 7 - 25 mg/dL   Creat 0.86 0.70 - 1.18 mg/dL    Comment:   For patients > or = 76 years of age: The upper reference limit for Creatinine is approximately 13% higher for people identified as African-American.      Total Bilirubin 0.4 0.2 - 1.2 mg/dL   Alkaline Phosphatase 47 40 - 115 U/L   AST 14 10 - 35 U/L   ALT 10 9 - 46 U/L   Total Protein 7.3 6.1 - 8.1 g/dL   Albumin 3.9 3.6 - 5.1 g/dL   Calcium 9.3 8.6 - 10.3 mg/dL   GFR, Est African American >89 >=60 mL/min   GFR, Est Non African American 85 >=60 mL/min  CBC with Differential/Platelet     Status: Abnormal   Collection Time: 01/09/17 12:18 PM  Result Value Ref Range   WBC 3.3 (L) 3.8 - 10.8 K/uL   RBC 5.56 4.20 - 5.80 MIL/uL   Hemoglobin 15.8 13.2 - 17.1 g/dL   HCT 48.2 38.5 - 50.0 %   MCV 86.7 80.0 - 100.0 fL   MCH 28.4 27.0 - 33.0 pg   MCHC 32.8 32.0 - 36.0 g/dL   RDW 14.8 11.0 - 15.0 %   Platelets 295 140 - 400 K/uL   MPV 9.8 7.5 - 12.5 fL   Neutro Abs 1,650 1,500 - 7,800 cells/uL   Lymphs Abs 1,254 850 - 3,900 cells/uL   Monocytes Absolute 330 200 - 950 cells/uL   Eosinophils Absolute 33 15 - 500 cells/uL   Basophils Absolute 33 0 - 200 cells/uL   Neutrophils Relative % 50 %   Lymphocytes Relative 38 %   Monocytes Relative 10 %   Eosinophils Relative 1 %   Basophils Relative 1 %   Smear Review Criteria for review not met   TSH     Status: Abnormal   Collection Time: 01/09/17 12:18 PM   Result Value Ref Range     TSH 4.89 (H) 0.40 - 4.50 mIU/L  Hemoglobin A1c     Status: None   Collection Time: 01/09/17 12:18 PM  Result Value Ref Range   Hgb A1c MFr Bld 5.6 <5.7 %    Comment:   For the purpose of screening for the presence of diabetes:   <5.7%       Consistent with the absence of diabetes 5.7-6.4 %   Consistent with increased risk for diabetes (prediabetes) >=6.5 %     Consistent with diabetes   This assay result is consistent with a decreased risk of diabetes.   Currently, no consensus exists regarding use of hemoglobin A1c for diagnosis of diabetes in children.   According to American Diabetes Association (ADA) guidelines, hemoglobin A1c <7.0% represents optimal control in non-pregnant diabetic patients. Different metrics may apply to specific patient populations. Standards of Medical Care in Diabetes (ADA).      Mean Plasma Glucose 114 mg/dL  Insulin, fasting     Status: None   Collection Time: 01/09/17 12:18 PM  Result Value Ref Range   Insulin fasting, serum 3.3 2.0 - 19.6 uIU/mL    Comment:   This insulin assay shows strong cross-reactivity for some insulin analogs (lispro, aspart, and glargine) and much lower cross-reactivity with others (detemir, glulisine).   Stimulated Insulin reference intervals were established using the Siemens Immulite assay. These values are provided for general guidance only.       PHQ2/9: Depression screen [facility] 2/9 03/19/2017 12/20/2016 05/31/2016 10/07/2015 06/04/2015  Decreased Interest 0 0 0 0 0  Down, Depressed, Hopeless 0 0 0 0 0  PHQ - 2 Score 0 0 0 0 0     Fall Risk: Fall Risk  03/19/2017 12/20/2016 05/31/2016 10/07/2015 06/04/2015  Falls in the past year? _0      Functional Status Survey: Is the patient deaf or have difficulty hearing?: No Does the patient have difficulty seeing, even when wearing glasses/contacts?: No Does the patient have difficulty concentrating, remembering, or making  decisions?: No Does the patient have difficulty walking or climbing stairs?: No Does the patient have difficulty dressing or bathing?: No Does the patient have difficulty doing errands alone such as visiting a doctor's office or shopping?: No   Assessment & Plan   1. Paronychia of right thumb    Consent signed: YES  Procedure: Incision and drainage of the area of fluctuance  Location: right lateral thumb Equipment used: 15 blade Anesthesia:  1% Lidocaine w/o Epinephrine  Cleaned and prepped: Betadine  After consent signed, affected area of skin prepped with betadine. Lidocaine w/o epinephrine injected into surround areas and area of intended incisions directly over greatest area of fluctuance of abscess. After properly numbed, 1cm incision with #10 blade made over abscess. Purulent material expressed, abscess cavity and loculations debrided.    - Wound culture - HYDROcodone-acetaminophen (NORCO) 10-325 MG tablet; Take 1 tablet by mouth every 8 (eight) hours as needed.  Dispense: 12 tablet; Refill: 0 - lidocaine (PF) (XYLOCAINE) 1 % injection 2 mL; Inject 2 mLs into the skin once. - amoxicillin-clavulanate (AUGMENTIN) 875-125 MG tablet; Take 1 tablet by mouth 2 (two) times daily.  Dispense: 20 tablet; Refill: 0  2. Abnormal TSH  - Thyroid Panel With TSH

## 2017-03-20 LAB — THYROID PANEL WITH TSH
Free Thyroxine Index: 1.3 — ABNORMAL LOW (ref 1.4–3.8)
T3 Uptake: 33 % (ref 22–35)
T4 TOTAL: 3.8 ug/dL — AB (ref 4.5–12.0)
TSH: 7.78 mIU/L — ABNORMAL HIGH (ref 0.40–4.50)

## 2017-03-22 LAB — WOUND CULTURE
Gram Stain: NONE SEEN
Gram Stain: NONE SEEN

## 2017-04-05 ENCOUNTER — Telehealth: Payer: Self-pay | Admitting: Family Medicine

## 2017-04-05 DIAGNOSIS — I10 Essential (primary) hypertension: Secondary | ICD-10-CM

## 2017-04-05 NOTE — Telephone Encounter (Signed)
Please call pt - he needs a 3 month follow up with Dr. Carlynn PurlSowles scheduled in June. 30 day supply provided. Thank you!

## 2017-04-09 NOTE — Telephone Encounter (Signed)
Pt has appointment scheduled for 05/04/17

## 2017-05-03 ENCOUNTER — Other Ambulatory Visit: Payer: Self-pay | Admitting: Family Medicine

## 2017-05-03 DIAGNOSIS — I10 Essential (primary) hypertension: Secondary | ICD-10-CM

## 2017-05-04 ENCOUNTER — Encounter: Payer: Self-pay | Admitting: Family Medicine

## 2017-05-04 ENCOUNTER — Ambulatory Visit (INDEPENDENT_AMBULATORY_CARE_PROVIDER_SITE_OTHER): Payer: Medicare Other | Admitting: Family Medicine

## 2017-05-04 VITALS — BP 144/78 | HR 85 | Temp 97.8°F | Resp 18 | Ht 66.0 in | Wt 179.1 lb

## 2017-05-04 DIAGNOSIS — M545 Low back pain, unspecified: Secondary | ICD-10-CM

## 2017-05-04 DIAGNOSIS — E040 Nontoxic diffuse goiter: Secondary | ICD-10-CM

## 2017-05-04 DIAGNOSIS — R7989 Other specified abnormal findings of blood chemistry: Secondary | ICD-10-CM

## 2017-05-04 DIAGNOSIS — I10 Essential (primary) hypertension: Secondary | ICD-10-CM | POA: Diagnosis not present

## 2017-05-04 DIAGNOSIS — E049 Nontoxic goiter, unspecified: Secondary | ICD-10-CM | POA: Diagnosis not present

## 2017-05-04 DIAGNOSIS — R739 Hyperglycemia, unspecified: Secondary | ICD-10-CM | POA: Diagnosis not present

## 2017-05-04 DIAGNOSIS — R946 Abnormal results of thyroid function studies: Secondary | ICD-10-CM

## 2017-05-04 MED ORDER — BENAZEPRIL HCL 20 MG PO TABS: 20.0000 mg | ORAL_TABLET | Freq: Every day | ORAL | 1 refills | Status: DC

## 2017-05-04 MED ORDER — IBUPROFEN 800 MG PO TABS: 800.0000 mg | ORAL_TABLET | Freq: Three times a day (TID) | ORAL | 0 refills | Status: DC | PRN

## 2017-05-04 NOTE — Progress Notes (Signed)
Name: Dennis Ford   MRN: [id number]    DOB: 07/31/1941   Date:05/04/2017       Progress Note  Subjective  Chief Complaint  Chief Complaint  Patient presents with  . Hypertension  . Medication Refill    HPI  HTN: he is here today for follow up, tolerating lotensin, bp has improved, slightly above goal, but he does not want to change medication at this time. Refuses EKG. No chest pain, no palpitation or SOB  Intermittent low back pain: triggered by working in his yard, mild , intermittent, and taking Ibuprofen prn instead of hydrocodone . No radiculitis. Pain is described as aching and triggered by movement, like twisting.   Goiter: also abnormal thyroid function, advised to see Endo. He denies constipation, palpitation, dry skin or fatigue  Patient Active Problem List   Diagnosis Date Noted  . Intermittent low back pain 12/20/2016  . Hyperglycemia 06/01/2016  . Hypertension, benign 05/31/2016  . Dermatitis 05/31/2016  . 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 Abnormal TSH 06/04/2015    Past Surgical History:  Procedure Laterality Date  . COLONOSCOPY    . CYST EXCISION Left 2008   Upper Back on Left Shoulder  . HERNIA REPAIR     [facility]   . INGUINAL HERNIA REPAIR  11/27/2011   Procedure: HERNIA REPAIR INGUINAL ADULT;  Surgeon: Jetty Duhamel, MD;  Location: [facility];  Service: General;  Laterality: Right;  right inguinal hernia repair with mesh    Family History  Problem Relation Age of Onset  . Breast cancer Mother     Social History   Social History  . Marital status: Married    Spouse name: N/A  . Number of children: N/A  . Years of education: N/A   Occupational History  . Not on file.   Social History Main Topics  . Smoking status: Former Smoker    Types: Cigars    Quit date: 11/22/1988  . Smokeless tobacco: Never Used     Comment: Smokes off and on   . Alcohol use No     Comment: he quit 10/2016  . Drug use: No  . Sexual activity: Yes   Partners: Female   Other Topics Concern  . Not on file   Social History Narrative  . No narrative on file     Current Outpatient Prescriptions:  .  allopurinol (ZYLOPRIM) 300 MG tablet, Take 300 mg by mouth daily., Disp: , Rfl: 0 .  aspirin EC 81 MG tablet, Take 1 tablet (81 mg total) by mouth daily. (Patient not taking: Reported on 03/19/2017), Disp: 30 tablet, Rfl: 0 .  benazepril (LOTENSIN) 20 MG tablet, TAKE 1 TABLET (20 MG TOTAL) BY MOUTH DAILY., Disp: 30 tablet, Rfl: 0 .  ibuprofen (ADVIL,MOTRIN) 800 MG tablet, Take 1 tablet (800 mg total) by mouth every 8 (eight) hours as needed., Disp: 30 tablet, Rfl: 0 .  triamcinolone (KENALOG) 0.025 % cream, Apply 1 application topically 2 (two) times daily., Disp: 30 g, Rfl: 1  Allergies  Allergen Reactions  . Norvasc [Amlodipine] Other (See Comments)    angioedema     ROS  Constitutional: Negative for fever or weight change.  Respiratory: Negative for cough and shortness of breath.   Cardiovascular: Negative for chest pain or palpitations.  Gastrointestinal: Negative for abdominal pain, no bowel changes.  Musculoskeletal: Negative for gait problem or joint swelling.  Skin: Negative for rash.  Neurological: Negative for dizziness or headache.  No other specific  complaints in a complete review of systems (except as listed in HPI above).  Objective  Vitals:   05/04/17 1038  BP: (!) 144/78  Pulse: 85  Resp: 18  Temp: 97.8 F (36.6 C)  SpO2: 97%  Weight: 179 lb 1 oz (81.2 kg)  Height: 5\' 6"  (1.676 m)    Body mass index is 28.9 kg/m.  Physical Exam  Constitutional: Patient appears well-developed and well-nourished. No distress.  HEENT: head atraumatic, normocephalic, pupils equal and reactive to light, large goiter, neck supple, throat within normal limits Cardiovascular: Normal rate, regular rhythm and normal heart sounds.  No murmur heard. No BLE edema. Pulmonary/Chest: Effort normal and breath sounds normal. No  respiratory distress. Abdominal: Soft.  There is no tenderness. Psychiatric: Patient has a normal mood and affect. behavior is normal. Judgment and thought content normal.  Recent Results (from the past 2160 hour(s))  Thyroid Panel With TSH     Status: Abnormal   Collection Time: 03/19/17  3:23 PM  Result Value Ref Range   T4, Total 3.8 (L) 4.5 - 12.0 ug/dL   T3 Uptake 33 22 - 35 %   Free Thyroxine Index 1.3 (L) 1.4 - 3.8   TSH 7.78 (H) 0.40 - 4.50 mIU/L  Wound culture     Status: None   Collection Time: 03/19/17  3:25 PM  Result Value Ref Range   Culture STAPHYLOCOCCUS AUREUS     Comment: SOURCE->PARONYCHIA; SOURCE: WOUND&WOUND   Gram Stain No WBC Seen    Gram Stain No Squamous Epithelial Cells Seen    Gram Stain Few Gram Positive Cocci In Pairs     Comment: Rifampin and Gentamicin should not be used as single drugs for treatment of Staph infections.    Colony Count Abundant    Organism ID, Bacteria STAPHYLOCOCCUS AUREUS       Susceptibility   Staphylococcus aureus -  (no method available)    OXACILLIN <=0.25 Sensitive     CEFAZOLIN  Sensitive     GENTAMICIN <=0.5 Sensitive     CIPROFLOXACIN <=0.5 Sensitive     LEVOFLOXACIN <=0.12 Sensitive     MOXIFLOXACIN <=0.25 Sensitive     TRIMETH/SULFA <=10 Sensitive     VANCOMYCIN 1 Sensitive     CLINDAMYCIN <=0.25 Sensitive     ERYTHROMYCIN <=0.25 Sensitive     LINEZOLID 1 Sensitive     QUINUPRISTIN/DALF <=0.25 Sensitive     RIFAMPIN <=0.5 Sensitive     TETRACYCLINE <=1 Sensitive       PHQ2/9: Depression screen [facility] 2/9 03/19/2017 12/20/2016 05/31/2016 10/07/2015 06/04/2015  Decreased Interest 0 0 0 0 0  Down, Depressed, Hopeless 0 0 0 0 0  PHQ - 2 Score 0 0 0 0 0     Fall Risk: Fall Risk  03/19/2017 12/20/2016 05/31/2016 10/07/2015 06/04/2015  Falls in the past year? No No No No No     Assessment & Plan  1. Essential hypertension  Refuses EKG - benazepril (LOTENSIN) 20 MG tablet; Take 1 tablet (20 mg total) by mouth  daily.  Dispense: 90 tablet; Refill: 1  2. Intermittent low back pain  Doing well, off Hydrococone, only taking ibuprofe prn  - ibuprofen (ADVIL,MOTRIN) 800 MG tablet; Take 1 tablet (800 mg total) by mouth every 8 (eight) hours as needed.  Dispense: 30 tablet; Refill: 0  3. Hyperglycemia  Check hgbA1C, but he wants to wait until next visit  4. Abnormal TSH  - Ambulatory referral to Endocrinology  5. Goiter diffuse  -  Ambulatory referral to Endocrinology US done 2013 and did not go see Endo recommended by Dr. Thana AtesMorrisey

## 2017-05-06 NOTE — Telephone Encounter (Signed)
No medication refill requested in encounter. Please call pharmacy to find out if pt needs refills and forward this to his PCP (Dr. Carlynn PurlSowles). Thank you!

## 2017-05-07 NOTE — Telephone Encounter (Signed)
Patient seen Dr.Sowles 

## 2017-07-27 ENCOUNTER — Telehealth: Payer: Self-pay | Admitting: Family Medicine

## 2017-07-27 DIAGNOSIS — M545 Low back pain, unspecified: Secondary | ICD-10-CM

## 2017-07-27 NOTE — Telephone Encounter (Signed)
He is due for follow up, I will send refill once scheduled

## 2017-07-27 NOTE — Telephone Encounter (Signed)
Pt scheduled appt for Aug 27, 2017 however he does not understand why he has to come in sooner because according to his records you wanted him back in 6 months which would be December.

## 2017-07-31 NOTE — Telephone Encounter (Signed)
Pt thanks you and moved appointment to Jan.

## 2017-08-01 ENCOUNTER — Encounter: Payer: Self-pay | Admitting: Family Medicine

## 2017-08-01 ENCOUNTER — Ambulatory Visit (INDEPENDENT_AMBULATORY_CARE_PROVIDER_SITE_OTHER): Payer: Medicare Other | Admitting: Family Medicine

## 2017-08-01 VITALS — BP 178/100 | HR 100 | Temp 97.9°F | Resp 18 | Ht 66.0 in | Wt 181.9 lb

## 2017-08-01 DIAGNOSIS — R946 Abnormal results of thyroid function studies: Secondary | ICD-10-CM | POA: Diagnosis not present

## 2017-08-01 DIAGNOSIS — M109 Gout, unspecified: Secondary | ICD-10-CM

## 2017-08-01 DIAGNOSIS — E049 Nontoxic goiter, unspecified: Secondary | ICD-10-CM | POA: Diagnosis not present

## 2017-08-01 DIAGNOSIS — I10 Essential (primary) hypertension: Secondary | ICD-10-CM

## 2017-08-01 DIAGNOSIS — R1011 Right upper quadrant pain: Secondary | ICD-10-CM

## 2017-08-01 DIAGNOSIS — R110 Nausea: Secondary | ICD-10-CM

## 2017-08-01 DIAGNOSIS — E040 Nontoxic diffuse goiter: Secondary | ICD-10-CM

## 2017-08-01 DIAGNOSIS — R6881 Early satiety: Secondary | ICD-10-CM

## 2017-08-01 DIAGNOSIS — R7989 Other specified abnormal findings of blood chemistry: Secondary | ICD-10-CM

## 2017-08-01 DIAGNOSIS — R739 Hyperglycemia, unspecified: Secondary | ICD-10-CM

## 2017-08-01 DIAGNOSIS — Z23 Encounter for immunization: Secondary | ICD-10-CM

## 2017-08-01 DIAGNOSIS — R1312 Dysphagia, oropharyngeal phase: Secondary | ICD-10-CM

## 2017-08-01 MED ORDER — NEBIVOLOL HCL 10 MG PO TABS: 10.0000 mg | ORAL_TABLET | Freq: Every day | ORAL | 0 refills | Status: DC

## 2017-08-01 NOTE — Progress Notes (Signed)
Name: Dennis Ford   MRN: [id number]    DOB: 07-15-1941   Date:08/01/2017       Progress Note  Subjective  Chief Complaint  Chief Complaint  Patient presents with  . Hypertension    States his BP has been running high at home, and with new medication- his neck get very tight.  . Abdominal Pain    New dosage of Benazepril 20 mg has caused him to have right side pain when taking medication.    HPI  HTN: he is here today for follow up, bo has been elevated at home ( daughter has been checking, but he is not aware of the numbers), no headaches , chest pain or palpitation. He states only taking bp medication every other day because otherwise he has a RUQ and nausea, worse at night, and it causes difficulty sleeping, No vomiting or change in bowel movements. He took Lotensin today, after we measured bp. He has also noticed early satiety, but no weight changes. He states symptoms started after hernia operation about 5 years ago, he states not any worse. He wants to hold off on getting EKG done  Intermittent low back pain: triggered by working in his yard, mild , intermittent, and taking Ibuprofen prn ( very seldom)  instead of hydrocodone . No radiculitis. Pain is described as aching and triggered by movement, like twisting. He has noticed right upper quadrant pain that is worse at night, we will rule out GI causes but explained that may be radiculitis.  Goiter: also abnormal thyroid function, he has an appointment coming up in October, we will recheck levels. He denies constipation, palpitation, dry skin or fatigue. He has noticed oral- esophageal dysphagia.    Patient Active Problem List   Diagnosis Date Noted  . Intermittent low back pain 12/20/2016  . Hyperglycemia 06/01/2016  . Hypertension, benign 05/31/2016  . Dermatitis 05/31/2016  . 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 Abnormal TSH 06/04/2015    Past Surgical History:  Procedure Laterality Date  . COLONOSCOPY    . CYST EXCISION Left  2008   Upper Back on Left Shoulder  . HERNIA REPAIR     [facility]   . INGUINAL HERNIA REPAIR  11/27/2011   Procedure: HERNIA REPAIR INGUINAL ADULT;  Surgeon: Jetty Duhamel, MD;  Location: [facility];  Service: General;  Laterality: Right;  right inguinal hernia repair with mesh    Family History  Problem Relation Age of Onset  . Breast cancer Mother     Social History   Social History  . Marital status: Married    Spouse name: N/A  . Number of children: N/A  . Years of education: N/A   Occupational History  . Not on file.   Social History Main Topics  . Smoking status: Former Smoker    Types: Cigars    Quit date: 11/22/1988  . Smokeless tobacco: Never Used     Comment: Smokes off and on   . Alcohol use No     Comment: he quit 10/2016  . Drug use: No  . Sexual activity: Yes    Partners: Female   Other Topics Concern  . Not on file   Social History Narrative  . No narrative on file     Current Outpatient Prescriptions:  .  allopurinol (ZYLOPRIM) 300 MG tablet, TAKE 1 TABLET BY MOUTH EVERY DAY, Disp: 90 tablet, Rfl: 1 .  benazepril (LOTENSIN) 20 MG tablet, Take 1 tablet (20 mg total) by  mouth daily., Disp: 90 tablet, Rfl: 1 .  ibuprofen (ADVIL,MOTRIN) 800 MG tablet, TAKE 1 TABLET BY MOUTH EVERY 8 HOURS AS NEEDED, Disp: 30 tablet, Rfl: 0 .  triamcinolone (KENALOG) 0.025 % cream, Apply 1 application topically 2 (two) times daily., Disp: 30 g, Rfl: 1 .  aspirin EC 81 MG tablet, Take 1 tablet (81 mg total) by mouth daily. (Patient not taking: Reported on 08/01/2017), Disp: 30 tablet, Rfl: 0 .  nebivolol (BYSTOLIC) 10 MG tablet, Take 1 tablet (10 mg total) by mouth daily., Disp: 30 tablet, Rfl: 0  Allergies  Allergen Reactions  . Norvasc [Amlodipine] Other (See Comments)    angioedema     ROS  Constitutional: Negative for fever or weight change.  Respiratory: Negative for cough and shortness of breath.   Cardiovascular: Negative for chest pain or  palpitations.  Gastrointestinal: Positive for right upper abdominal pain, no bowel changes.  Musculoskeletal: Negative for gait problem or joint swelling.  Skin: Negative for rash.  Neurological: Negative for dizziness or headache.  No other specific complaints in a complete review of systems (except as listed in HPI above).  Objective  Vitals:   08/01/17 0839  BP: (!) 180/100  Pulse: 100  Resp: 18  Temp: 97.9 F (36.6 C)  TempSrc: Oral  SpO2: 97%  Weight: 181 lb 14.4 oz (82.5 kg)  Height:  (1.676 m)    Body mass index is 29.36 kg/m.  Physical Exam  Constitutional: Patient appears well-developed and well-nourished. No distress.  HEENT: head atraumatic, normocephalic, pupils equal and reactive to light, goiter,  neck supple, throat within normal limits Cardiovascular: Normal rate, regular rhythm and normal heart sounds.  No murmur heard. No BLE edema. Pulmonary/Chest: Effort normal and breath sounds normal. No respiratory distress. Abdominal: Soft.  There is right upper quadrant pain, normal bowel sounds, no masses, but right upper quadrant seems more pronounced than left side Psychiatric: Patient has a normal mood and affect. behavior is normal. Judgment and thought content normal.  PHQ2/9: Depression screen [facility] 2/9 08/01/2017 03/19/2017 12/20/2016 05/31/2016 10/07/2015  Decreased Interest 0 0 0 0 0  Down, Depressed, Hopeless 0 0 0 0 0  PHQ - 2 Score 0 0 0 0 0     Fall Risk: Fall Risk  08/01/2017 03/19/2017 12/20/2016 05/31/2016 10/07/2015  Falls in the past year? No No No No No     Functional Status Survey: Is the patient deaf or have difficulty hearing?: No Does the patient have difficulty seeing, even when wearing glasses/contacts?: No Does the patient have difficulty concentrating, remembering, or making decisions?: No Does the patient have difficulty walking or climbing stairs?: No Does the patient have difficulty dressing or bathing?: No Does the patient have  difficulty doing errands alone such as visiting a doctor's office or shopping?: No    Assessment & Plan  1. Uncontrolled hypertension  Daughter checks at home and also high, we will check labs, add Bystolic today, cannot take Norvasc( allergy)  or HCTZ ( has gout)  - CBC with Differential/Platelet - COMPLETE METABOLIC PANEL WITH GFR - Parathyroid hormone, intact (no Ca) - nebivolol (BYSTOLIC) 10 MG tablet; Take 1 tablet (10 mg total) by mouth daily.  Dispense: 30 tablet; Refill: 0  2. Abnormal TSH  - Thyroid Panel With TSH  3. Goiter diffuse  - Thyroid Panel With TSH  4. Controlled gout  Cannot give him diuretic  5. Needs flu shot  refused  6. Hyperglycemia  - Hemoglobin A1c  7.  Nausea  - US Abdomen Limited RUQ; Future - Ambulatory referral to Gastroenterology  8. Right upper quadrant abdominal pain  - US Abdomen Limited RUQ; Future - Ambulatory referral to Gastroenterology  9. Early satiety  - US Abdomen Limited RUQ; Future - Ambulatory referral to Gastroenterology  10. Oropharyngeal dysphagia  It could be secondary to goiter, has follow up with endocrinologist soon

## 2017-08-01 NOTE — Patient Instructions (Signed)
DASH Eating Plan DASH stands for "Dietary Approaches to Stop Hypertension." The DASH eating plan is a healthy eating plan that has been shown to reduce high blood pressure (hypertension). It may also reduce your risk for type 2 diabetes, heart disease, and stroke. The DASH eating plan may also help with weight loss. What are tips for following this plan? General guidelines  Avoid eating more than 2,300 mg (milligrams) of salt (sodium) a day. If you have hypertension, you may need to reduce your sodium intake to 1,500 mg a day.  Limit alcohol intake to no more than 1 drink a day for nonpregnant women and 2 drinks a day for men. One drink equals 12 oz of beer, 5 oz of wine, or 1 oz of hard liquor.  Work with your health care provider to maintain a healthy body weight or to lose weight. Ask what an ideal weight is for you.  Get at least 30 minutes of exercise that causes your heart to beat faster (aerobic exercise) most days of the week. Activities may include walking, swimming, or biking.  Work with your health care provider or diet and nutrition specialist (dietitian) to adjust your eating plan to your individual calorie needs. Reading food labels  Check food labels for the amount of sodium per serving. Choose foods with less than 5 percent of the Daily Value of sodium. Generally, foods with less than 300 mg of sodium per serving fit into this eating plan.  To find whole grains, look for the word "whole" as the first word in the ingredient list. Shopping  Buy products labeled as "low-sodium" or "no salt added."  Buy fresh foods. Avoid canned foods and premade or frozen meals. Cooking  Avoid adding salt when cooking. Use salt-free seasonings or herbs instead of table salt or sea salt. Check with your health care provider or pharmacist before using salt substitutes.  Do not fry foods. Cook foods using healthy methods such as baking, boiling, grilling, and broiling instead.  Cook with  heart-healthy oils, such as olive, canola, soybean, or sunflower oil. Meal planning   Eat a balanced diet that includes: ? 5 or more servings of fruits and vegetables each day. At each meal, try to fill half of your plate with fruits and vegetables. ? Up to 6-8 servings of whole grains each day. ? Less than 6 oz of lean meat, poultry, or fish each day. A 3-oz serving of meat is about the same size as a deck of cards. One egg equals 1 oz. ? 2 servings of low-fat dairy each day. ? A serving of nuts, seeds, or beans 5 times each week. ? Heart-healthy fats. Healthy fats called Omega-3 fatty acids are found in foods such as flaxseeds and coldwater fish, like sardines, salmon, and mackerel.  Limit how much you eat of the following: ? Canned or prepackaged foods. ? Food that is high in trans fat, such as fried foods. ? Food that is high in saturated fat, such as fatty meat. ? Sweets, desserts, sugary drinks, and other foods with added sugar. ? Full-fat dairy products.  Do not salt foods before eating.  Try to eat at least 2 vegetarian meals each week.  Eat more home-cooked food and less restaurant, buffet, and fast food.  When eating at a restaurant, ask that your food be prepared with less salt or no salt, if possible. What foods are recommended? The items listed may not be a complete list. Talk with your dietitian about what   dietary choices are best for you. Grains Whole-grain or whole-wheat bread. Whole-grain or whole-wheat pasta. Brown rice. Oatmeal. Quinoa. Bulgur. Whole-grain and low-sodium cereals. Pita bread. Low-fat, low-sodium crackers. Whole-wheat flour tortillas. Vegetables Fresh or frozen vegetables (raw, steamed, roasted, or grilled). Low-sodium or reduced-sodium tomato and vegetable juice. Low-sodium or reduced-sodium tomato sauce and tomato paste. Low-sodium or reduced-sodium canned vegetables. Fruits All fresh, dried, or frozen fruit. Canned fruit in natural juice (without  added sugar). Meat and other protein foods Skinless chicken or turkey. Ground chicken or turkey. Pork with fat trimmed off. Fish and seafood. Egg whites. Dried beans, peas, or lentils. Unsalted nuts, nut butters, and seeds. Unsalted canned beans. Lean cuts of beef with fat trimmed off. Low-sodium, lean deli meat. Dairy Low-fat (1%) or fat-free (skim) milk. Fat-free, low-fat, or reduced-fat cheeses. Nonfat, low-sodium ricotta or cottage cheese. Low-fat or nonfat yogurt. Low-fat, low-sodium cheese. Fats and oils Soft margarine without trans fats. Vegetable oil. Low-fat, reduced-fat, or light mayonnaise and salad dressings (reduced-sodium). Canola, safflower, olive, soybean, and sunflower oils. Avocado. Seasoning and other foods Herbs. Spices. Seasoning mixes without salt. Unsalted popcorn and pretzels. Fat-free sweets. What foods are not recommended? The items listed may not be a complete list. Talk with your dietitian about what dietary choices are best for you. Grains Baked goods made with fat, such as croissants, muffins, or some breads. Dry pasta or rice meal packs. Vegetables Creamed or fried vegetables. Vegetables in a cheese sauce. Regular canned vegetables (not low-sodium or reduced-sodium). Regular canned tomato sauce and paste (not low-sodium or reduced-sodium). Regular tomato and vegetable juice (not low-sodium or reduced-sodium). Pickles. Olives. Fruits Canned fruit in a light or heavy syrup. Fried fruit. Fruit in cream or butter sauce. Meat and other protein foods Fatty cuts of meat. Ribs. Fried meat. Bacon. Sausage. Bologna and other processed lunch meats. Salami. Fatback. Hotdogs. Bratwurst. Salted nuts and seeds. Canned beans with added salt. Canned or smoked fish. Whole eggs or egg yolks. Chicken or turkey with skin. Dairy Whole or 2% milk, cream, and half-and-half. Whole or full-fat cream cheese. Whole-fat or sweetened yogurt. Full-fat cheese. Nondairy creamers. Whipped toppings.  Processed cheese and cheese spreads. Fats and oils Butter. Stick margarine. Lard. Shortening. Ghee. Bacon fat. Tropical oils, such as coconut, palm kernel, or palm oil. Seasoning and other foods Salted popcorn and pretzels. Onion salt, garlic salt, seasoned salt, table salt, and sea salt. Worcestershire sauce. Tartar sauce. Barbecue sauce. Teriyaki sauce. Soy sauce, including reduced-sodium. Steak sauce. Canned and packaged gravies. Fish sauce. Oyster sauce. Cocktail sauce. Horseradish that you find on the shelf. Ketchup. Mustard. Meat flavorings and tenderizers. Bouillon cubes. Hot sauce and Tabasco sauce. Premade or packaged marinades. Premade or packaged taco seasonings. Relishes. Regular salad dressings. Where to find more information:  National Heart, Lung, and Blood Institute: www.nhlbi.nih.gov  American Heart Association: www.heart.org Summary  The DASH eating plan is a healthy eating plan that has been shown to reduce high blood pressure (hypertension). It may also reduce your risk for type 2 diabetes, heart disease, and stroke.  With the DASH eating plan, you should limit salt (sodium) intake to 2,300 mg a day. If you have hypertension, you may need to reduce your sodium intake to 1,500 mg a day.  When on the DASH eating plan, aim to eat more fresh fruits and vegetables, whole grains, lean proteins, low-fat dairy, and heart-healthy fats.  Work with your health care provider or diet and nutrition specialist (dietitian) to adjust your eating plan to your individual   calorie needs. This information is not intended to replace advice given to you by your health care provider. Make sure you discuss any questions you have with your health care provider. Document Released: 10/12/2011 Document Revised: 10/16/2016 Document Reviewed: 10/16/2016 Elsevier Interactive Patient Education  2017 [employer]  

## 2017-08-02 LAB — COMPLETE METABOLIC PANEL WITH GFR
AG Ratio: 1.3 (calc) (ref 1.0–2.5)
ALKALINE PHOSPHATASE (APISO): 51 U/L (ref 40–115)
ALT: 15 U/L (ref 9–46)
AST: 14 U/L (ref 10–35)
Albumin: 4 g/dL (ref 3.6–5.1)
BILIRUBIN TOTAL: 0.5 mg/dL (ref 0.2–1.2)
BUN: 12 mg/dL (ref 7–25)
CO2: 27 mmol/L (ref 20–32)
Calcium: 9.2 mg/dL (ref 8.6–10.3)
Chloride: 104 mmol/L (ref 98–110)
Creat: 1.04 mg/dL (ref 0.70–1.18)
GFR, Est African American: 80 mL/min/{1.73_m2} (ref >=60)
GFR, Est Non African American: 69 mL/min/{1.73_m2} (ref >=60)
GLUCOSE: 109 mg/dL (ref 65–139)
Globulin: 3.1 g/dL (calc) (ref 1.9–3.7)
Potassium: 3.8 mmol/L (ref 3.5–5.3)
Sodium: 138 mmol/L (ref 135–146)
Total Protein: 7.1 g/dL (ref 6.1–8.1)

## 2017-08-02 LAB — THYROID PANEL WITH TSH
Free Thyroxine Index: 1.3 — ABNORMAL LOW (ref 1.4–3.8)
T3 UPTAKE: 27 % (ref 22–35)
T4, Total: 4.8 ug/dL — ABNORMAL LOW (ref 4.9–10.5)
TSH: 9.11 mIU/L — ABNORMAL HIGH (ref 0.40–4.50)

## 2017-08-02 LAB — CBC WITH DIFFERENTIAL/PLATELET
Basophils Absolute: 40 cells/uL (ref 0–200)
Basophils Relative: 1.2 %
EOS ABS: 89 {cells}/uL (ref 15–500)
Eosinophils Relative: 2.7 %
HCT: 48.1 % (ref 38.5–50.0)
Hemoglobin: 15.5 g/dL (ref 13.2–17.1)
Lymphs Abs: 1406 cells/uL (ref 850–3900)
MCH: 27.1 pg (ref 27.0–33.0)
MCHC: 32.2 g/dL (ref 32.0–36.0)
MCV: 84.2 fL (ref 80.0–100.0)
MONOS PCT: 11.1 %
MPV: 10.7 fL (ref 7.5–12.5)
NEUTROS PCT: 42.4 %
Neutro Abs: 1399 cells/uL — ABNORMAL LOW (ref 1500–7800)
PLATELETS: 328 10*3/uL (ref 140–400)
RBC: 5.71 10*6/uL (ref 4.20–5.80)
RDW: 14.1 % (ref 11.0–15.0)
TOTAL LYMPHOCYTE: 42.6 %
WBC mixed population: 366 cells/uL (ref 200–950)
WBC: 3.3 10*3/uL — ABNORMAL LOW (ref 3.8–10.8)

## 2017-08-02 LAB — HEMOGLOBIN A1C
HEMOGLOBIN A1C: 5.4 %{Hb} (ref <5.7)
Mean Plasma Glucose: 108 (calc)
eAG (mmol/L): 6 (calc)

## 2017-08-02 LAB — PARATHYROID HORMONE, INTACT (NO CA): PTH: 55 pg/mL (ref 14–64)

## 2017-08-03 ENCOUNTER — Ambulatory Visit: Payer: Medicare Other

## 2017-08-15 ENCOUNTER — Ambulatory Visit: Payer: Medicare Other | Admitting: Family Medicine

## 2017-08-22 ENCOUNTER — Ambulatory Visit (INDEPENDENT_AMBULATORY_CARE_PROVIDER_SITE_OTHER): Payer: Medicare Other | Admitting: Family Medicine

## 2017-08-22 ENCOUNTER — Encounter: Payer: Self-pay | Admitting: Family Medicine

## 2017-08-22 VITALS — BP 162/94 | HR 71 | Temp 98.0°F | Resp 16 | Ht 67.5 in | Wt 182.8 lb

## 2017-08-22 DIAGNOSIS — Z23 Encounter for immunization: Secondary | ICD-10-CM

## 2017-08-22 DIAGNOSIS — E049 Nontoxic goiter, unspecified: Secondary | ICD-10-CM

## 2017-08-22 DIAGNOSIS — E040 Nontoxic diffuse goiter: Secondary | ICD-10-CM

## 2017-08-22 DIAGNOSIS — I10 Essential (primary) hypertension: Secondary | ICD-10-CM

## 2017-08-22 DIAGNOSIS — R7989 Other specified abnormal findings of blood chemistry: Secondary | ICD-10-CM | POA: Diagnosis not present

## 2017-08-22 MED ORDER — HYDRALAZINE HCL 10 MG PO TABS: 10.0000 mg | ORAL_TABLET | Freq: Three times a day (TID) | ORAL | 0 refills | Status: DC

## 2017-08-22 MED ORDER — CARVEDILOL 12.5 MG PO TABS: 12.5000 mg | ORAL_TABLET | Freq: Two times a day (BID) | ORAL | 0 refills | Status: DC

## 2017-08-22 NOTE — Progress Notes (Signed)
Name: Dennis Ford   MRN: [id number]    DOB: 12-20-1940   Date:08/22/2017       Progress Note  Subjective  Chief Complaint  Chief Complaint  Patient presents with  . Follow-up    2 week F/U  . Abdominal Pain    Bystolic is very extensive but made his abdominal pain go away.  . Hypertension    Has only been taking Bystolic and stopped Benazepril. Denies any symptoms    HPI  HTN: bo has been elevated at home ( daughter has been checking, but he is not aware of the numbers), no headaches , chest pain or palpitation. I added Bystolic to his Benazepril, but he stopped taking Benazepril. He also states that Bystolic was too expensive and would like something cheaper. We will change from Bystolic to Coreg and add hydralazine, cannot take thiazide diuretic because of gout and is allergic to norvasc  Intermittent low back pain: triggered by working in his yard, mild , intermittent, and taking Ibuprofen prn ( very seldom)  instead of hydrocodone . No radiculitis. Pain is described as aching and triggered by movement, like twisting. Doing better now  Bloating: he states right side pain resolved, but continues to have a lot of gas and bloating, he did not go to have [place] and refuses to see GI at this time  Goiter: also abnormal thyroid function, he had an appointment with Dr. Tedd Sias, but he cancelled. Explained importance of re-scheduling.   He denies constipation, palpitation, dry skin or fatigue. He still has some difficulty swallowing oral- esophageal dysphagia.    Patient Active Problem List   Diagnosis Date Noted  . Intermittent low back pain 12/20/2016  . Hyperglycemia 06/01/2016  . Hypertension, benign 05/31/2016  . Dermatitis 05/31/2016  . 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 Abnormal TSH 06/04/2015    Past Surgical History:  Procedure Laterality Date  . COLONOSCOPY    . CYST EXCISION Left 2008   Upper Back on Left Shoulder  . HERNIA REPAIR     [facility]   . INGUINAL HERNIA REPAIR   11/27/2011   Procedure: HERNIA REPAIR INGUINAL ADULT;  Surgeon: Jetty Duhamel, MD;  [facility]:  [facility];  Service: General;  Laterality: Right;  right inguinal hernia repair with mesh    Family History  Problem Relation Age of Onset  . Breast cancer Mother     Social History   Social History  . Marital status: Married    Spouse name: N/A  . Number of children: N/A  . Years of education: N/A   Occupational History  . Not on file.   Social History Main Topics  . Smoking status: Former Smoker    Types: Cigars    Quit date: 11/22/1988  . Smokeless tobacco: Never Used     Comment: Smokes off and on   . Alcohol use No     Comment: he quit 10/2016  . Drug use: No  . Sexual activity: Yes    Partners: Female   Other Topics Concern  . Not on file   Social History Narrative  . No narrative on file     Current Outpatient Prescriptions:  .  allopurinol (ZYLOPRIM) 300 MG tablet, TAKE 1 TABLET BY MOUTH EVERY DAY, Disp: 90 tablet, Rfl: 1 .  ibuprofen (ADVIL,MOTRIN) 800 MG tablet, TAKE 1 TABLET BY MOUTH EVERY 8 HOURS AS NEEDED, Disp: 30 tablet, Rfl: 0 .  triamcinolone (KENALOG) 0.025 % cream, Apply 1 application topically 2 (two)  times daily., Disp: 30 g, Rfl: 1 .  aspirin EC 81 MG tablet, Take 1 tablet (81 mg total) by mouth daily. (Patient not taking: Reported on 08/22/2017), Disp: 30 tablet, Rfl: 0 .  benazepril (LOTENSIN) 20 MG tablet, Take 1 tablet (20 mg total) by mouth daily. (Patient not taking: Reported on 08/22/2017), Disp: 90 tablet, Rfl: 1 .  carvedilol (COREG) 12.5 MG tablet, Take 1 tablet (12.5 mg total) by mouth 2 (two) times daily with a meal. In place of Bystolic, Disp: 60 tablet, Rfl: 0 .  hydrALAZINE (APRESOLINE) 10 MG tablet, Take 1 tablet (10 mg total) by mouth 3 (three) times daily., Disp: 90 tablet, Rfl: 0  Allergies  Allergen Reactions  . Norvasc [Amlodipine] Other (See Comments)    angioedema     ROS  Constitutional: Negative for  fever or weight change.  Respiratory: Negative for cough and shortness of breath.   Cardiovascular: Negative for chest pain or palpitations.  Gastrointestinal: Negative for abdominal pain ( he states doing well at this time)  no bowel changes.  Musculoskeletal: Negative for gait problem or joint swelling.  Skin: Negative for rash.  Neurological: Negative for dizziness or headache.  No other specific complaints in a complete review of systems (except as listed in HPI above).  Objective  Vitals:   08/22/17 1018  BP: (!) 162/94  Pulse: 71  Resp: 16  Temp: 98 F (36.7 C)  TempSrc: Oral  SpO2: 99%  Weight: 182 lb 12.8 oz (82.9 kg)  Height: 5' 7.5" (1.715 m)    Body mass index is 28.21 kg/m.  Physical Exam  Constitutional: Patient appears well-developed and well-nourished. No distress.  HEENT: head atraumatic, normocephalic, pupils equal and reactive to light, goiter,  neck supple - thyromegaly , throat within normal limits Cardiovascular: Normal rate, regular rhythm and normal heart sounds.  No murmur heard. No BLE edema. Pulmonary/Chest: Effort normal and breath sounds normal. No respiratory distress. Abdominal: Soft.  There is right upper quadrant pain, normal bowel sounds, no masses, but right upper quadrant seems more pronounced than left side Psychiatric: Patient has a normal mood and affect. behavior is normal. Judgment and thought content normal.  Recent Results (from the past 2160 hour(s))  CBC with Differential/Platelet     Status: Abnormal   Collection Time: 08/01/17  9:27 AM  Result Value Ref Range   WBC 3.3 (L) 3.8 - 10.8 Thousand/uL   RBC 5.71 4.20 - 5.80 Million/uL   Hemoglobin 15.5 13.2 - 17.1 g/dL   HCT 96.0 45.4 - 09.8 %   MCV 84.2 80.0 - 100.0 fL   MCH 27.1 27.0 - 33.0 pg   MCHC 32.2 32.0 - 36.0 g/dL   RDW 11.9 14.7 - 82.9 %   Platelets 328 140 - 400 Thousand/uL   MPV 10.7 7.5 - 12.5 fL   Neutro Abs 1,399 (L) 1,500 - 7,800 cells/uL   Lymphs Abs 1,406  850 - 3,900 cells/uL   WBC mixed population 366 200 - 950 cells/uL   Eosinophils Absolute 89 15 - 500 cells/uL   Basophils Absolute 40 0 - 200 cells/uL   Neutrophils Relative % 42.4 %   Total Lymphocyte 42.6 %   Monocytes Relative 11.1 %   Eosinophils Relative 2.7 %   Basophils Relative 1.2 %  COMPLETE METABOLIC PANEL WITH GFR     Status: None   Collection Time: 08/01/17  9:27 AM  Result Value Ref Range   Glucose, Bld 109 65 - 139 mg/dL  Comment: .        Non-fasting reference interval .    BUN 12 7 - 25 mg/dL   Creat 6.21 3.08 - 6.57 mg/dL    Comment: For patients >56 years of age, the reference limit for Creatinine is approximately 13% higher for people identified as African-American. .    GFR, Est Non African American 69 > OR = 60 mL/min/1.41m2   GFR, Est African American 80 > OR = 60 mL/min/1.38m2   BUN/Creatinine Ratio NOT APPLICABLE 6 - 22 (calc)   Sodium 138 135 - 146 mmol/L   Potassium 3.8 3.5 - 5.3 mmol/L   Chloride 104 98 - 110 mmol/L   CO2 27 20 - 32 mmol/L   Calcium 9.2 8.6 - 10.3 mg/dL   Total Protein 7.1 6.1 - 8.1 g/dL   Albumin 4.0 3.6 - 5.1 g/dL   Globulin 3.1 1.9 - 3.7 g/dL (calc)   AG Ratio 1.3 1.0 - 2.5 (calc)   Total Bilirubin 0.5 0.2 - 1.2 mg/dL   Alkaline phosphatase (APISO) 51 40 - 115 U/L   AST 14 10 - 35 U/L   ALT 15 9 - 46 U/L  Hemoglobin A1c     Status: None   Collection Time: 08/01/17  9:27 AM  Result Value Ref Range   Hgb A1c MFr Bld 5.4 <5.7 % of total Hgb    Comment: For the purpose of screening for the presence of diabetes: . <5.7%       Consistent with the absence of diabetes 5.7-6.4%    Consistent with increased risk for diabetes             (prediabetes) > or =6.5%  Consistent with diabetes . This assay result is consistent with a decreased risk of diabetes. . Currently, no consensus exists regarding use of hemoglobin A1c for diagnosis of diabetes in children. . According to American Diabetes Association (ADA) guidelines,  hemoglobin A1c <7.0% represents optimal control in non-pregnant diabetic patients. Different metrics may apply to specific patient populations.  Standards of Medical Care in Diabetes(ADA). .    Mean Plasma Glucose 108 (calc)   eAG (mmol/L) 6.0 (calc)  Thyroid Panel With TSH     Status: Abnormal   Collection Time: 08/01/17  9:27 AM  Result Value Ref Range   T3 Uptake 27 22 - 35 %   T4, Total 4.8 (L) 4.9 - 10.5 mcg/dL   Free Thyroxine Index 1.3 (L) 1.4 - 3.8   TSH 9.11 (H) 0.40 - 4.50 mIU/L  Parathyroid hormone, intact (no Ca)     Status: None   Collection Time: 08/01/17  9:27 AM  Result Value Ref Range   PTH 55 14 - 64 pg/mL    Comment: . Interpretive Guide    Intact PTH           Calcium ------------------    ----------           ------- Normal Parathyroid    Normal               Normal Hypoparathyroidism    Low or Low Normal    Low Hyperparathyroidism    Primary            Normal or High       High    Secondary          High                 Normal or Low    Tertiary  High                 High Non-Parathyroid    Hypercalcemia      Low or Low Normal    High .      PHQ2/9: Depression screen [facility] 08/01/2017 03/19/2017 12/20/2016 05/31/2016 10/07/2015  Decreased Interest 0 0 0 0 0  Down, Depressed, Hopeless 0 0 0 0 0  PHQ - 2 Score 0 0 0 0 0     Fall Risk: Fall Risk  08/01/2017 03/19/2017 12/20/2016 05/31/2016 10/07/2015  Falls in the past year? No No No No No     Assessment & Plan  1. Uncontrolled hypertension  - carvedilol (COREG) 12.5 MG tablet; Take 1 tablet (12.5 mg total) by mouth 2 (two) times daily with a meal. In place of Bystolic  Dispense: 60 tablet; Refill: 0 - hydrALAZINE (APRESOLINE) 10 MG tablet; Take 1 tablet (10 mg total) by mouth 3 (three) times daily.  Dispense: 90 tablet; Refill: 0  2. Need for immunization against influenza  - Flu vaccine HIGH DOSE PF (Fluzone High dose)  3. Goiter diffuse   4. Abnormal TSH  He will re-schedule  follow up with Dr. Tedd SiasSolum, also has hyperparathyroidism

## 2017-08-22 NOTE — Patient Instructions (Signed)
Food Choices for Gastroesophageal Reflux Disease, Adult When you have gastroesophageal reflux disease (GERD), the foods you eat and your eating habits are very important. Choosing the right foods can help ease your discomfort. What guidelines do I need to follow?  Choose fruits, vegetables, whole grains, and low-fat dairy products.  Choose low-fat meat, fish, and poultry.  Limit fats such as oils, salad dressings, butter, nuts, and avocado.  Keep a food diary. This helps you identify foods that cause symptoms.  Avoid foods that cause symptoms. These may be different for everyone.  Eat small meals often instead of 3 large meals a day.  Eat your meals slowly, in a place where you are relaxed.  Limit fried foods.  Cook foods using methods other than frying.  Avoid drinking alcohol.  Avoid drinking large amounts of liquids with your meals.  Avoid bending over or lying down until 2-3 hours after eating. What foods are not recommended? These are some foods and drinks that may make your symptoms worse: Vegetables  Tomatoes. Tomato juice. Tomato and spaghetti sauce. Chili peppers. Onion and garlic. Horseradish. Fruits  Oranges, grapefruit, and lemon (fruit and juice). Meats  High-fat meats, fish, and poultry. This includes hot dogs, ribs, ham, sausage, salami, and bacon. Dairy  Whole milk and chocolate milk. Sour cream. Cream. Butter. Ice cream. Cream cheese. Drinks  Coffee and tea. Bubbly (carbonated) drinks or energy drinks. Condiments  Hot sauce. Barbecue sauce. Sweets/Desserts  Chocolate and cocoa. Donuts. Peppermint and spearmint. Fats and Oils  High-fat foods. This includes French fries and potato chips. Other  Vinegar. Strong spices. This includes black pepper, white pepper, red pepper, cayenne, curry powder, cloves, ginger, and chili powder. The items listed above may not be a complete list of foods and drinks to avoid. Contact your dietitian for more information.    This information is not intended to replace advice given to you by your health care provider. Make sure you discuss any questions you have with your health care provider. Document Released: 04/23/2012 Document Revised: 03/30/2016 Document Reviewed: 08/27/2013 Elsevier Interactive Patient Education  2017 Elsevier Inc.  

## 2017-08-27 ENCOUNTER — Ambulatory Visit: Payer: Medicare Other | Admitting: Family Medicine

## 2017-08-28 ENCOUNTER — Ambulatory Visit: Payer: Medicare Other | Admitting: Gastroenterology

## 2017-09-24 ENCOUNTER — Encounter: Payer: Self-pay | Admitting: Family Medicine

## 2017-09-24 ENCOUNTER — Ambulatory Visit (INDEPENDENT_AMBULATORY_CARE_PROVIDER_SITE_OTHER): Payer: Medicare Other | Admitting: Family Medicine

## 2017-09-24 VITALS — BP 180/100 | HR 96 | Temp 97.5°F | Resp 16 | Ht 68.0 in | Wt 184.5 lb

## 2017-09-24 DIAGNOSIS — E040 Nontoxic diffuse goiter: Secondary | ICD-10-CM

## 2017-09-24 DIAGNOSIS — E049 Nontoxic goiter, unspecified: Secondary | ICD-10-CM

## 2017-09-24 DIAGNOSIS — I10 Essential (primary) hypertension: Secondary | ICD-10-CM

## 2017-09-24 DIAGNOSIS — R1030 Lower abdominal pain, unspecified: Secondary | ICD-10-CM

## 2017-09-24 DIAGNOSIS — F418 Other specified anxiety disorders: Secondary | ICD-10-CM

## 2017-09-24 MED ORDER — CARVEDILOL 25 MG PO TABS: 25.0000 mg | ORAL_TABLET | Freq: Two times a day (BID) | ORAL | 0 refills | Status: DC

## 2017-09-24 MED ORDER — HYDRALAZINE HCL 25 MG PO TABS: 25.0000 mg | ORAL_TABLET | Freq: Three times a day (TID) | ORAL | 0 refills | Status: DC

## 2017-09-24 NOTE — Progress Notes (Signed)
Name: Dennis Ford   MRN: [id number]    DOB: 1941-09-21   Date:09/24/2017       Progress Note  Subjective  Chief Complaint  Chief Complaint  Patient presents with  . Follow-up    1 month F/U  . Hypertension    Denies any symptoms  . Goiter    Cancelled appointment with Dr. Tedd Sias until he gets BP under control.    HPI   HTN: bo has been elevated at home ( daughter has been checking, but he is not aware of the numbers), no headaches , chest pain or palpitation. I added Bystolic to his Benazepril, but he stopped taking Benazepril. He also states that Bystolic was too expensive and would like something cheaper. We changed to Hydralazine and also coreg, bp is even higher today, he states he gets very nervous when he comes to the doctor. Also worries about his health - he is afraid he has cancer because of chronic low abdominal pain ( started after hernia repair surgery about 5 years ago), and goiter.   Bloating: he states right side pain resolved, but continues to have a lot of gas and bloating, he did not go to have [place] and refuses to see GI at this time, he also has early satiety.  Goiter: also abnormal thyroid function, he had an appointment with Dr. Tedd Sias, but he cancelled. Explained importance of re-scheduling, he states he would prefer going to [place] - his niece works as a Engineer, civil (consulting) at [employer]  He denies constipation, palpitation, dry skin or fatigue. He still has some difficulty swallowing oral- esophageal dysphagia.   Low abdominal pain: he states since inguinal hernia repair, unable to wear belts, does not like any pressure on lower abdomen, also feels that it is the cause of early satiety, no change in bowel movements, no blood in stools, no bladder problems.    Patient Active Problem List   Diagnosis Date Noted  . Intermittent low back pain 12/20/2016  . Hyperglycemia 06/01/2016  . Hypertension, benign 05/31/2016  . Dermatitis 05/31/2016  . 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  Abnormal TSH 06/04/2015    Past Surgical History:  Procedure Laterality Date  . COLONOSCOPY    . CYST EXCISION Left 2008   Upper Back on Left Shoulder  . HERNIA REPAIR     [facility]   . HERNIA REPAIR INGUINAL ADULT Right 11/27/2011   Performed by Frederik Schmidt, MD at [facility]    Family History  Problem Relation Age of Onset  . Breast cancer Mother     Social History   Socioeconomic History  . Marital status: Married    Spouse name: Not on file  . Number of children: Not on file  . Years of education: Not on file  . Highest education level: Not on file  Social Needs  . Financial resource strain: Not on file  . Food insecurity - worry: Not on file  . Food insecurity - inability: Not on file  . Transportation needs - medical: Not on file  . Transportation needs - non-medical: Not on file  Occupational History  . Not on file  Tobacco Use  . Smoking status: Former Smoker    Types: Cigars    Last attempt to quit: 11/22/1988    Years since quitting: 28.8  . Smokeless tobacco: Never Used  . Tobacco comment: Smokes off and on   Substance and Sexual Activity  . Alcohol use: No    Comment: he quit 10/2016  .  Drug use: No  . Sexual activity: Yes    Partners: Female  Other Topics Concern  . Not on file  Social History Narrative  . Not on file     Current Outpatient Medications:  .  allopurinol (ZYLOPRIM) 300 MG tablet, TAKE 1 TABLET BY MOUTH EVERY DAY, Disp: 90 tablet, Rfl: 1 .  benazepril (LOTENSIN) 20 MG tablet, Take 1 tablet (20 mg total) by mouth daily., Disp: 90 tablet, Rfl: 1 .  carvedilol (COREG) 25 MG tablet, Take 1 tablet (25 mg total) 2 (two) times daily with a meal by mouth. In place of Bystolic, Disp: 60 tablet, Rfl: 0 .  hydrALAZINE (APRESOLINE) 25 MG tablet, Take 1 tablet (25 mg total) 3 (three) times daily by mouth., Disp: 90 tablet, Rfl: 0 .  triamcinolone (KENALOG) 0.025 % cream, Apply 1 application topically 2 (two) times daily., Disp: 30 g, Rfl:  1 .  aspirin EC 81 MG tablet, Take 1 tablet (81 mg total) by mouth daily. (Patient not taking: Reported on 08/22/2017), Disp: 30 tablet, Rfl: 0  Allergies  Allergen Reactions  . Norvasc [Amlodipine] Other (See Comments)    angioedema     ROS  Constitutional: Negative for fever or weight change.  Respiratory: Negative for cough and shortness of breath.   Cardiovascular: Negative for chest pain or palpitations.  Gastrointestinal: Positive  For chronic  abdominal pain, no bowel changes.  Musculoskeletal: Negative for gait problem or joint swelling.  Skin: Negative for rash.  Neurological: Negative for dizziness or headache.  No other specific complaints in a complete review of systems (except as listed in HPI above).  Objective  Vitals:   09/24/17 1023 09/24/17 1154  BP: (!) 222/100 (!) 180/100  Pulse: 96   Resp: 16   Temp: (!) 97.5 F (36.4 C)   TempSrc: Oral   SpO2: 96%   Weight: 184 lb 8 oz (83.7 kg)   Height: 5\' 8"  (1.727 m)     Body mass index is 28.05 kg/m.  Physical Exam  Constitutional: Patient appears well-developed and well-nourished.No distress.  HEENT: head atraumatic, normocephalic, pupils equal and reactive to light,  neck supple, throat within normal limits Cardiovascular: Normal rate, regular rhythm and normal heart sounds.  No murmur heard. No BLE edema. Pulmonary/Chest: Effort normal and breath sounds normal. No respiratory distress. Abdominal: Soft.  There is no tenderness. Psychiatric: Patient has a normal mood and affect. behavior is normal. Judgment and thought content normal. Neurological: no focal findings.  Recent Results (from the past 2160 hour(s))  CBC with Differential/Platelet     Status: Abnormal   Collection Time: 08/01/17  9:27 AM  Result Value Ref Range   WBC 3.3 (L) 3.8 - 10.8 Thousand/uL   RBC 5.71 4.20 - 5.80 Million/uL   Hemoglobin 15.5 13.2 - 17.1 g/dL   HCT 16.148.1 09.638.5 - 04.550.0 %   MCV 84.2 80.0 - 100.0 fL   MCH 27.1 27.0 -  33.0 pg   MCHC 32.2 32.0 - 36.0 g/dL   RDW 40.914.1 81.111.0 - 91.415.0 %   Platelets 328 140 - 400 Thousand/uL   MPV 10.7 7.5 - 12.5 fL   Neutro Abs 1,399 (L) 1,500 - 7,800 cells/uL   Lymphs Abs 1,406 850 - 3,900 cells/uL   WBC mixed population 366 200 - 950 cells/uL   Eosinophils Absolute 89 15 - 500 cells/uL   Basophils Absolute 40 0 - 200 cells/uL   Neutrophils Relative % 42.4 %   Total Lymphocyte 42.6 %  Monocytes Relative 11.1 %   Eosinophils Relative 2.7 %   Basophils Relative 1.2 %  COMPLETE METABOLIC PANEL WITH GFR     Status: None   Collection Time: 08/01/17  9:27 AM  Result Value Ref Range   Glucose, Bld 109 65 - 139 mg/dL    Comment: .        Non-fasting reference interval .    BUN 12 7 - 25 mg/dL   Creat 1.61 0.96 - 0.45 mg/dL    Comment: For patients >29 years of age, the reference limit for Creatinine is approximately 13% higher for people identified as African-American. .    GFR, Est Non African American 69 > OR = 60 mL/min/1.50m2   GFR, Est African American 80 > OR = 60 mL/min/1.2m2   BUN/Creatinine Ratio NOT APPLICABLE 6 - 22 (calc)   Sodium 138 135 - 146 mmol/L   Potassium 3.8 3.5 - 5.3 mmol/L   Chloride 104 98 - 110 mmol/L   CO2 27 20 - 32 mmol/L   Calcium 9.2 8.6 - 10.3 mg/dL   Total Protein 7.1 6.1 - 8.1 g/dL   Albumin 4.0 3.6 - 5.1 g/dL   Globulin 3.1 1.9 - 3.7 g/dL (calc)   AG Ratio 1.3 1.0 - 2.5 (calc)   Total Bilirubin 0.5 0.2 - 1.2 mg/dL   Alkaline phosphatase (APISO) 51 40 - 115 U/L   AST 14 10 - 35 U/L   ALT 15 9 - 46 U/L  Hemoglobin A1c     Status: None   Collection Time: 08/01/17  9:27 AM  Result Value Ref Range   Hgb A1c MFr Bld 5.4 <5.7 % of total Hgb    Comment: For the purpose of screening for the presence of diabetes: . <5.7%       Consistent with the absence of diabetes 5.7-6.4%    Consistent with increased risk for diabetes             (prediabetes) > or =6.5%  Consistent with diabetes . This assay result is consistent with a  decreased risk of diabetes. . Currently, no consensus exists regarding use of hemoglobin A1c for diagnosis of diabetes in children. . According to American Diabetes Association (ADA) guidelines, hemoglobin A1c <7.0% represents optimal control in non-pregnant diabetic patients. Different metrics may apply to specific patient populations.  Standards of Medical Care in Diabetes(ADA). .    Mean Plasma Glucose 108 (calc)   eAG (mmol/L) 6.0 (calc)  Thyroid Panel With TSH     Status: Abnormal   Collection Time: 08/01/17  9:27 AM  Result Value Ref Range   T3 Uptake 27 22 - 35 %   T4, Total 4.8 (L) 4.9 - 10.5 mcg/dL   Free Thyroxine Index 1.3 (L) 1.4 - 3.8   TSH 9.11 (H) 0.40 - 4.50 mIU/L  Parathyroid hormone, intact (no Ca)     Status: None   Collection Time: 08/01/17  9:27 AM  Result Value Ref Range   PTH 55 14 - 64 pg/mL    Comment: . Interpretive Guide    Intact PTH           Calcium ------------------    ----------           ------- Normal Parathyroid    Normal               Normal Hypoparathyroidism    Low or Low Normal    Low Hyperparathyroidism    Primary  Normal or High       High    Secondary          High                 Normal or Low    Tertiary           High                 High Non-Parathyroid    Hypercalcemia      Low or Low Normal    High .      PHQ2/9: [facility] 08/01/2017 03/19/2017 12/20/2016 05/31/2016 10/07/2015  Decreased Interest 0 0 0 0 0  Down, Depressed, Hopeless 0 0 0 0 0  PHQ - 2 Score 0 0 0 0 0     Fall Risk: Fall Risk  08/01/2017 03/19/2017 12/20/2016 05/31/2016 10/07/2015  Falls in the past year? No No No No No     Assessment & Plan  1. Uncontrolled hypertension  - carvedilol (COREG) 25 MG tablet; Take 1 tablet (25 mg total) 2 (two) times daily with a meal by mouth. In place of Bystolic  Dispense: 60 tablet; Refill: 0 - hydrALAZINE (APRESOLINE) 25 MG tablet; Take 1 tablet (25 mg total) 3 (three) times daily by mouth.   Dispense: 90 tablet; Refill: 0 - Ambulatory referral to Nephrology  He took another dose of Coreg and Hydralazine ( previous dose) in our office and bp improved a little, return in one week for bp check   2. Goiter diffuse  - Ambulatory referral to Endocrinology  3. Anxiety about health  Discussed adding something for anxiety, but he refuses.   4. Lower abdominal pain  - Ambulatory referral to General Surgery

## 2017-10-22 ENCOUNTER — Other Ambulatory Visit: Payer: Self-pay | Admitting: Family Medicine

## 2017-10-22 DIAGNOSIS — I10 Essential (primary) hypertension: Secondary | ICD-10-CM

## 2017-11-07 ENCOUNTER — Ambulatory Visit: Payer: Medicare Other | Admitting: Family Medicine

## 2017-11-13 DIAGNOSIS — I10 Essential (primary) hypertension: Secondary | ICD-10-CM | POA: Diagnosis not present

## 2017-11-13 DIAGNOSIS — R1030 Lower abdominal pain, unspecified: Secondary | ICD-10-CM | POA: Diagnosis not present

## 2017-11-14 ENCOUNTER — Ambulatory Visit: Payer: Self-pay

## 2017-11-14 ENCOUNTER — Telehealth: Payer: Self-pay | Admitting: Family Medicine

## 2017-11-14 NOTE — Telephone Encounter (Signed)
See telephone encounter.

## 2017-11-14 NOTE — Telephone Encounter (Signed)
He needs to be seen tomorrow 

## 2017-11-14 NOTE — Telephone Encounter (Signed)
Pt was not able to make it tomorrow, but he did make appointment for this Friday 11-16-17

## 2017-11-14 NOTE — Telephone Encounter (Signed)
Pt. called to make Dr. Carlynn PurlSowles aware that the Carvedilol and Hydralazine is causing a lot of abdominal bloating, when he lays down at night.  Stated he will not take anymore.  Also reported he saw the surgeon yesterday, and his blood pressure is still elevated (180/100), so doesn't feel like the new BP medication is doing much good.  Is requesting to have a refill on Benazepril until he can come in to talk with her.  Reported he only has one dose left.  Scheduled pt. an appt. 11/27/17 for f/u.  Will send message to Dr. Carlynn PurlSowles re: question of refilling the Benazepril.  Pt. Agreed.

## 2017-11-16 ENCOUNTER — Other Ambulatory Visit: Payer: Self-pay | Admitting: Family Medicine

## 2017-11-16 ENCOUNTER — Ambulatory Visit (INDEPENDENT_AMBULATORY_CARE_PROVIDER_SITE_OTHER): Payer: Medicare Other | Admitting: Family Medicine

## 2017-11-16 ENCOUNTER — Encounter: Payer: Self-pay | Admitting: Family Medicine

## 2017-11-16 ENCOUNTER — Telehealth: Payer: Self-pay | Admitting: Family Medicine

## 2017-11-16 VITALS — BP 150/100 | HR 90 | Ht 68.0 in | Wt 182.6 lb

## 2017-11-16 DIAGNOSIS — E049 Nontoxic goiter, unspecified: Secondary | ICD-10-CM

## 2017-11-16 DIAGNOSIS — F411 Generalized anxiety disorder: Secondary | ICD-10-CM | POA: Diagnosis not present

## 2017-11-16 DIAGNOSIS — I10 Essential (primary) hypertension: Secondary | ICD-10-CM

## 2017-11-16 DIAGNOSIS — E040 Nontoxic diffuse goiter: Secondary | ICD-10-CM

## 2017-11-16 MED ORDER — TRIAMTERENE-HCTZ 37.5-25 MG PO TABS: 1.0000 | ORAL_TABLET | Freq: Every day | ORAL | 3 refills | Status: DC

## 2017-11-16 NOTE — Patient Instructions (Signed)

## 2017-11-16 NOTE — Progress Notes (Signed)
Name: Dennis Ford   MRN: [id number]    DOB: 21-Nov-1940   Date:11/16/2017       Progress Note  Subjective  Chief Complaint  Chief Complaint  Patient presents with  . Hypertension  . Hypothyroidism    HPI  Uncontrolled HTN: he saw general surgeon last week and bp was over 200's. He states he thinks bp is elevated since he had a cyst removed from his back ( he feels it is pressing on his heart), he also thinks Hydralazine and Coreg causes him to swell and stopped medication this week. He likes ace inhibitor. No chest pain , palpitation, no neuro deficit, he states when bp is high he developes a headache  Goiter: going to see endocrinologist soon.   GAD: he states he has always been anxious , but no changes, discussed giving him medication since bp is high. He wants to try new bp medication prior to adding medication for anxiety  Patient Active Problem List   Diagnosis Date Noted  . Intermittent low back pain 12/20/2016  . Hyperglycemia 06/01/2016  . Hypertension, benign 05/31/2016  . Dermatitis 05/31/2016  . 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 Abnormal TSH 06/04/2015    Past Surgical History:  Procedure Laterality Date  . COLONOSCOPY    . CYST EXCISION Left 2008   Upper Back on Left Shoulder  . HERNIA REPAIR     [facility]   . INGUINAL HERNIA REPAIR  11/27/2011   Procedure: HERNIA REPAIR INGUINAL ADULT;  Surgeon: Jetty DuhamelJames O Wyatt III, MD;  Location: [facility];  Service: General;  Laterality: Right;  right inguinal hernia repair with mesh    Family History  Problem Relation Age of Onset  . Breast cancer Mother     Social History   Socioeconomic History  . Marital status: Married    Spouse name: Not on file  . Number of children: Not on file  . Years of education: Not on file  . Highest education level: Not on file  Social Needs  . Financial resource strain: Not on file  . Food insecurity - worry: Not on file  . Food insecurity - inability: Not on file  .  Transportation needs - medical: Not on file  . Transportation needs - non-medical: Not on file  Occupational History  . Not on file  Tobacco Use  . Smoking status: Former Smoker    Types: Cigars    Last attempt to quit: 11/22/1988    Years since quitting: 29.0  . Smokeless tobacco: Never Used  . Tobacco comment: Smokes off and on   Substance and Sexual Activity  . Alcohol use: No    Comment: he quit 10/2016  . Drug use: No  . Sexual activity: Yes    Partners: Female  Other Topics Concern  . Not on file  Social History Narrative  . Not on file     Current Outpatient Medications:  .  allopurinol (ZYLOPRIM) 300 MG tablet, TAKE 1 TABLET BY MOUTH EVERY DAY, Disp: 90 tablet, Rfl: 1 .  aspirin EC 81 MG tablet, Take 1 tablet (81 mg total) by mouth daily. (Patient not taking: Reported on 08/22/2017), Disp: 30 tablet, Rfl: 0 .  benazepril (LOTENSIN) 20 MG tablet, Take 1 tablet (20 mg total) by mouth daily., Disp: 90 tablet, Rfl: 1 .  triamcinolone (KENALOG) 0.025 % cream, Apply 1 application topically 2 (two) times daily., Disp: 30 g, Rfl: 1 .  triamterene-hydrochlorothiazide (MAXZIDE-25) 37.5-25 MG tablet, Take 1 tablet by  mouth daily., Disp: 90 tablet, Rfl: 3  Allergies  Allergen Reactions  . Norvasc [Amlodipine] Other (See Comments)    angioedema     ROS  Ten systems reviewed and is negative except as mentioned in HPI   Objective  Vitals:   11/16/17 1358  BP: (!) 150/100  Pulse: 90  SpO2: 97%  Weight: 182 lb 9.6 oz (82.8 kg)  Height: 5\' 8"  (1.727 m)    Body mass index is 27.76 kg/m.  Physical Exam  Constitutional: Patient appears well-developed and well-nourished. No distress.  HEENT: head atraumatic, normocephalic, pupils equal and reactive to light, neck supple, throat within normal limits, goiter Cardiovascular: Normal rate, regular rhythm and normal heart sounds.  No murmur heard. No BLE edema. Pulmonary/Chest: Effort normal and breath sounds normal. No  respiratory distress. Abdominal: Soft.  There is no tenderness. Psychiatric: Patient has a normal mood and affect. behavior is normal. Judgment and thought content normal. Neurological: no focal deficit   PHQ2/9: Depression screen [facility] 2/9 08/01/2017 03/19/2017 12/20/2016 05/31/2016 10/07/2015  Decreased Interest 0 0 0 0 0  Down, Depressed, Hopeless 0 0 0 0 0  PHQ - 2 Score 0 0 0 0 0     Fall Risk: Fall Risk  11/16/2017 08/01/2017 03/19/2017 12/20/2016 05/31/2016  Falls in the past year? No No No No No    Functional Status Survey: Is the patient deaf or have difficulty hearing?: No Does the patient have difficulty seeing, even when wearing glasses/contacts?: No Does the patient have difficulty concentrating, remembering, or making decisions?: No Does the patient have difficulty walking or climbing stairs?: No Does the patient have difficulty dressing or bathing?: No Does the patient have difficulty doing errands alone such as visiting a doctor's office or shopping?: No  GAD 7 : Generalized Anxiety Score 11/16/2017  Nervous, Anxious, on Edge 3  Control/stop worrying 0  Worry too much - different things 0  Trouble relaxing 3  Restless 3  Easily annoyed or irritable 2  Afraid - awful might happen 2  Total GAD 7 Score 13  Anxiety Difficulty Somewhat difficult       Assessment & Plan   1. Uncontrolled hypertension  He states coreg and hydralazine makes him bloated, he is fine with trying a fluid pill, he is aware that it may flare his gout attacks.  - triamterene-hydrochlorothiazide (MAXZIDE-25) 37.5-25 MG tablet; Take 1 tablet by mouth daily.  Dispense: 90 tablet; Refill: 3   2. Goiter diffuse   3. GAD (generalized anxiety disorder)  Discussed medication, he will discuss it with his wife and return in one week for follow up

## 2017-11-16 NOTE — Telephone Encounter (Signed)
Copied from CRM [id number]. Topic: Quick Communication - Rx Refill/Question >> Nov 16, 2017  2:20 PM Oneal GroutSebastian, Jennifer S wrote: Medication:  triamterene-hydrochlorothiazide (MAXZIDE-25) 37.5-25 MG tablet, pharmacy is getting a flag with being unable to fill meds due to renal issues, need verbal approval to fill   Has the patient contacted their pharmacy? Yes.     (Agent: If no, request that the patient contact the pharmacy for the refill.)   Preferred Pharmacy (with phone number or street name): [facility]   Agent: Please be advised that RX refills may take up to 3 business days. We ask that you follow-up with your pharmacy.

## 2017-11-16 NOTE — Telephone Encounter (Signed)
His kidney function is normal, we will monitor his potassium since he is on ACE. Patient is aware it may cause gout flare

## 2017-11-27 ENCOUNTER — Ambulatory Visit: Payer: Self-pay | Admitting: Family Medicine

## 2017-11-30 ENCOUNTER — Telehealth: Payer: Self-pay

## 2017-11-30 NOTE — Telephone Encounter (Signed)
Where would he like to know

## 2017-11-30 NOTE — Telephone Encounter (Signed)
Copied from CRM [phone] [phone]. Topic: Referral - Status >> Nov 30, 2017  1:39 PM Terisa Starraylor, Brittany L wrote: Annabelle HarmanDana from [facility] called and stated that he did not want to make an appt with them. Call back [phone] if you hve any questions  >> Nov 30, 2017  2:08 PM [place], Nadyne CoombesLatisha A, [place] wrote: His PCP will be informed.  Patient stated that he has an appointment with the endocrinologist and wanted to handle one thing at a time. Patient was informed that he could give us a call back when he is ready to proceed with this referral.

## 2017-12-13 ENCOUNTER — Ambulatory Visit: Payer: Medicare Other | Admitting: Endocrinology

## 2018-02-08 ENCOUNTER — Other Ambulatory Visit: Payer: Self-pay | Admitting: Family Medicine

## 2018-02-08 DIAGNOSIS — I10 Essential (primary) hypertension: Secondary | ICD-10-CM

## 2018-02-08 MED ORDER — TRIAMTERENE-HCTZ 37.5-25 MG PO TABS: 1.0000 | ORAL_TABLET | Freq: Every day | ORAL | 0 refills | Status: DC

## 2018-02-08 NOTE — Telephone Encounter (Signed)
Pt has an appt for next week °

## 2018-02-08 NOTE — Telephone Encounter (Signed)
Refill request for Hypertension medication:  Lotensin 20 mg  Last office visit pertaining to hypertension: 11/16/2017  BP Readings from Last 3 Encounters:  11/16/17 (!) 150/100  09/24/17 (!) 180/100  08/22/17 (!) 162/94     Lab Results  Component Value Date   CREATININE 1.04 08/01/2017   BUN 12 08/01/2017   NA 138 08/01/2017   K 3.8 08/01/2017   CL 104 08/01/2017   CO2 27 08/01/2017   Follow-ups on file. None indicated

## 2018-02-08 NOTE — Telephone Encounter (Signed)
Uncontrolled bp , I will send 10 days, needs follow up

## 2018-02-13 ENCOUNTER — Encounter: Payer: Self-pay | Admitting: Nurse Practitioner

## 2018-02-13 ENCOUNTER — Ambulatory Visit (INDEPENDENT_AMBULATORY_CARE_PROVIDER_SITE_OTHER): Payer: Medicare Other | Admitting: Nurse Practitioner

## 2018-02-13 VITALS — BP 206/88 | HR 100 | Temp 98.2°F | Resp 18 | Ht 68.0 in | Wt 182.1 lb

## 2018-02-13 DIAGNOSIS — M549 Dorsalgia, unspecified: Secondary | ICD-10-CM | POA: Diagnosis not present

## 2018-02-13 DIAGNOSIS — R7989 Other specified abnormal findings of blood chemistry: Secondary | ICD-10-CM | POA: Diagnosis not present

## 2018-02-13 DIAGNOSIS — E049 Nontoxic goiter, unspecified: Secondary | ICD-10-CM

## 2018-02-13 DIAGNOSIS — G8929 Other chronic pain: Secondary | ICD-10-CM | POA: Diagnosis not present

## 2018-02-13 DIAGNOSIS — I10 Essential (primary) hypertension: Secondary | ICD-10-CM

## 2018-02-13 DIAGNOSIS — E040 Nontoxic diffuse goiter: Secondary | ICD-10-CM

## 2018-02-13 MED ORDER — BACLOFEN 10 MG PO TABS: 10.0000 mg | ORAL_TABLET | Freq: Three times a day (TID) | ORAL | 0 refills | Status: DC

## 2018-02-13 MED ORDER — BENAZEPRIL HCL 20 MG PO TABS: 40.0000 mg | ORAL_TABLET | Freq: Every day | ORAL | 0 refills | Status: DC

## 2018-02-13 NOTE — Progress Notes (Addendum)
Name: Dennis Ford   MRN: [id number]    DOB: 10/03/1941   Date:02/13/2018       Progress Note  Subjective  Chief Complaint  Chief Complaint  Patient presents with  . Hypertension    elevated BP 176/92, 171/100, 152/84    HPI  Hypertension Patient has had hypertension over ten years  Checking blood pressure away from here?  yes How often? Every morning  Range (low to high) over last two weeks:  152-186 / 89-100  Feels blood pressure is under fair control  Hypertension-associated complications:  none  If taking medicines, are you taking them regularly?  yes  Siblings / family history: Does high blood pressure run in your family?   YES  Salt:  Trying to limit sodium / salt when buying foods at the grocery store?  NO Do you try to limit added salt when cooking and at the table?  yes  Sweets/licorice:  Do you eat a lot of sweets or eat black licorice?  no  Saturated fats: Do you eat a lot of foods like bacon, sausage, pepperoni, cheeseburgers, hot dogs, bologna, and cheese?  YES - eats a lot of bacon-   Sedentary lifestyle:  Exercise/activity level:  frequent (three or more times per week)  Steroids/Non-steroidals:  Have you had a recent cortisone shot in the last few months?  no  Do you take prednisone or prescription NSAIDs or take OTCS NSAIDs such as ibuprofen, Motrin, Advil, Aleve, or naproxen? no states takes tylenol for pain   Smoking: Do you smoke?  no  Snoring / sleep apnea: Do you snore or have sleep apnea?  no If diagnosed with sleep apnea, do you use your machine?  NO  Stress: Do you feel like you are under excessive stress or that your stress level affects your blood pressure at times?    no  Stroh's (alcohol): Do you drink alcohol  yes  --- if yes, do you drink more than 14 drinks/week if male or more than 7 drinks/week if male?  no  Sudafed (decongestants): Do you use any OTC decongestant products like Allegra-D, Claritin-D, Zyrtec-D, Tylenol Cold and  Sinus, etc.?  no  He states his blood pressure is always high in the mornings but then it come down later in the day. He endorses some stiffness and pain in the morning around left back where had a cyst removal years ago. Denies headaches, blurry vision, weakness. Patient states feels his blood pressure is elevated because of his pain when he is active in the garden during the day and then he is sore when he wakes up in the morning. He states when he was taking vicodin his blood pressure was well-controlled, but he had to come off of it to prevent dependence. He was taking ibuprofen with moderate relief but came off that due to hypertension. Patient now just takes tylenol but does not have pain relief.   Patient Active Problem List   Diagnosis Date Noted  . Intermittent low back pain 12/20/2016  . Hyperglycemia 06/01/2016  . Hypertension, benign 05/31/2016  . Dermatitis 05/31/2016  . 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 Abnormal TSH 06/04/2015    Past Medical History:  Diagnosis Date  . Diabetes mellitus without complication (HCC)   . Goiter   . History of inguinal hernia repair   . Hyperlipidemia   . Hypertension     Past Surgical History:  Procedure Laterality Date  . COLONOSCOPY    . CYST EXCISION Left 2008  Upper Back on Left Shoulder  . HERNIA REPAIR     [facility]   . INGUINAL HERNIA REPAIR  11/27/2011   Procedure: HERNIA REPAIR INGUINAL ADULT;  Surgeon: Jetty DuhamelJames O Wyatt III, MD;  Location: [facility];  Service: General;  Laterality: Right;  right inguinal hernia repair with mesh    Social History   Tobacco Use  . Smoking status: Former Smoker    Types: Cigars    Last attempt to quit: 11/22/1988    Years since quitting: 29.2  . Smokeless tobacco: Never Used  . Tobacco comment: Smokes off and on   Substance Use Topics  . Alcohol use: No    Comment: he quit 10/2016     Current Outpatient Medications:  .  allopurinol (ZYLOPRIM) 300 MG tablet, TAKE 1 TABLET BY MOUTH  EVERY DAY, Disp: 90 tablet, Rfl: 1 .  benazepril (LOTENSIN) 20 MG tablet, Take 2 tablets (40 mg total) by mouth daily., Disp: 28 tablet, Rfl: 0 .  triamterene-hydrochlorothiazide (MAXZIDE-25) 37.5-25 MG tablet, Take 1 tablet by mouth daily., Disp: 10 tablet, Rfl: 0 .  aspirin EC 81 MG tablet, Take 1 tablet (81 mg total) by mouth daily. (Patient not taking: Reported on 08/22/2017), Disp: 30 tablet, Rfl: 0 .  baclofen (LIORESAL) 10 MG tablet, Take 1 tablet (10 mg total) by mouth 3 (three) times daily., Disp: 30 each, Rfl: 0 .  triamcinolone (KENALOG) 0.025 % cream, Apply 1 application topically 2 (two) times daily. (Patient not taking: Reported on 02/13/2018), Disp: 30 g, Rfl: 1  Allergies  Allergen Reactions  . Norvasc [Amlodipine] Other (See Comments)    angioedema    ROS   Constitutional: Negative for fever or weight change.  Respiratory: Negative for cough and Positive chronic exretional cshortness of breath.   Cardiovascular: Negative for chest pain or palpitations.  Gastrointestinal: Negative for abdominal pain, no bowel changes.  Musculoskeletal: Negative for gait problem or joint swelling.  Skin: Negative for rash.  Neurological: Negative for dizziness or headache.  No other specific complaints in a complete review of systems (except as listed in HPI above).  Objective  Vitals:   02/13/18 0943 02/13/18 1029  BP: (!) 210/96 (!) 206/88  Pulse: 100   Resp: 18   Temp: 98.2 F (36.8 C)   TempSrc: Oral   SpO2: 97%   Weight: 182 lb 1.6 oz (82.6 kg)   Height: 5\' 8"  (1.727 m)      Body mass index is 27.69 kg/m.  Nursing Note and Vital Signs reviewed.  Physical Exam  Constitutional: Patient appears well-developed and well-nourished. No distress. Sitting comfortably in chair joking with staff.  Cardiovascular: Normal rate, regular rhythm, S1/S2 present.  No murmur or rub heard. No edema. Pulmonary/Chest: Effort normal and breath sounds clear. No respiratory distress or  retractions. Abdominal: Soft and non-tender, bowel sounds present  Psychiatric: Patient has a normal mood and affect. behavior is normal. Judgment and thought content normal.  No results found for this or any previous visit (from the past 72 hour(s)).  Assessment & Plan  Patient presents today for blood pressure follow-up after adjusting medications.  Patient is still very hypertensive.  He denies any symptoms presently, states he feels in great health.  He states every morning his blood pressure is very high and it decreases throughout the day.  Patient states his blood pressure has been very well controlled in the past on an ace inhibitor and Vicodin.  Patient states that he was taken off of  Vicodin his blood pressure spiked up due to increased pain; pain is from cyst removal on back and muscle aches from outdoor activities.  He states he takes Tylenol for his pain which does not relieve it; he feels that his pain is relieved then his blood pressure will go back down.  Discussed sending patient to cardiology, adding another medication-patient politely declined.  patient requesting to hold off on appointment for another two weeks- discussed BP is dangerously elevated agreed to come for blood pressure check on Friday and full appointment in the next week or sooner if pressures remain above 160 systolic. Strict ER precautions discussed. Patient was given endo consult due to abnormal TSH strongly encouraged patient to follow-up; sts will call Dr. Reuel Derby. Discussed DASH.   1. Other chronic back pain - baclofen (LIORESAL) 10 MG tablet; Take 1 tablet (10 mg total) by mouth 3 (three) times daily.  Dispense: 30 each; Refill: 0  2.  Essential hypertension - benazepril (LOTENSIN) 20 MG tablet; Take 2 tablets (40 mg total) by mouth daily.  Dispense: 28 tablet; Refill: 0  1. Other chronic back pain - baclofen (LIORESAL) 10 MG tablet; Take 1 tablet (10 mg total) by mouth 3 (three) times daily.  Dispense: 30  each; Refill: 0  3. Abnormal TSH - Will call Dr. Reuel Derby for follow-up  4. Goiter diffuse - Will call Dr. Reuel Derby for follow-up    -Red flags and when to present for emergency care or RTC including fever >101.70F, chest pain, shortness of breath, new/worsening/un-resolving symptoms, aphasia, headache, slurred speech, unilateral weakness, vision changes reviewed with patient at time of visit. Follow up and care instructions discussed and provided in AVS.  Face-to-face time with patient was more than 25 minutes, >50% time spent counseling and coordination of care  --------------------------------------------------  BP Readings from Last 3 Encounters:  02/13/18 (!) 206/88  11/16/17 (!) 150/100  09/24/17 (!) 180/100   Wt Readings from Last 3 Encounters:  02/13/18 182 lb 1.6 oz (82.6 kg)  11/16/17 182 lb 9.6 oz (82.8 kg)  09/24/17 184 lb 8 oz (83.7 kg)   Patient will return in 48 hours for a blood pressure check and EKG and urine dip, urine microalbumin, BMP with staff and convert to seeing provider if BP elevated >140/90, and then will also see provider 10 days from now with BMP with the increased ACE-I; patient adamantly declined the recommendation for an extra blood pressure medication, but allowed increase in the ACE-I; last creatinine reviewed; patient completely asymptomatic I have reviewed this encounter including the documentation in this note and/or discussed this patient with the provider, Sharyon Cable DNP AGNP-C. I am certifying that I agree with the content of this note as supervising physician. Baruch Gouty, MD [facility] 02/13/2018, 2:19 PM

## 2018-02-13 NOTE — Patient Instructions (Addendum)
- take your baclofen at night to see if that helps with your pain - We increased your dose of benazepril to 40mg  a day - we will follow-up with you in a 2-5 days for a blood pressure check  - And then a full follow up in 10 days for lab work and adjusting medicines   DASH Eating Plan DASH stands for "Dietary Approaches to Stop Hypertension." The DASH eating plan is a healthy eating plan that has been shown to reduce high blood pressure (hypertension). It may also reduce your risk for type 2 diabetes, heart disease, and stroke. The DASH eating plan may also help with weight loss. What are tips for following this plan? General guidelines  Avoid eating more than 2,300 mg (milligrams) of salt (sodium) a day. If you have hypertension, you may need to reduce your sodium intake to 1,500 mg a day.  Limit alcohol intake to no more than 1 drink a day for nonpregnant women and 2 drinks a day for men. One drink equals 12 oz of beer, 5 oz of wine, or 1 oz of hard liquor.  Work with your health care provider to maintain a healthy body weight or to lose weight. Ask what an ideal weight is for you.  Get at least 30 minutes of exercise that causes your heart to beat faster (aerobic exercise) most days of the week. Activities may include walking, swimming, or biking.  Work with your health care provider or diet and nutrition specialist (dietitian) to adjust your eating plan to your individual calorie needs. Reading food labels  Check food labels for the amount of sodium per serving. Choose foods with less than 5 percent of the Daily Value of sodium. Generally, foods with less than 300 mg of sodium per serving fit into this eating plan.  To find whole grains, look for the word "whole" as the first word in the ingredient list. Shopping  Buy products labeled as "low-sodium" or "no salt added."  Buy fresh foods. Avoid canned foods and premade or frozen meals. Cooking  Avoid adding salt when cooking. Use  salt-free seasonings or herbs instead of table salt or sea salt. Check with your health care provider or pharmacist before using salt substitutes.  Do not fry foods. Cook foods using healthy methods such as baking, boiling, grilling, and broiling instead.  Cook with heart-healthy oils, such as olive, canola, soybean, or sunflower oil. Meal planning   Eat a balanced diet that includes: ? 5 or more servings of fruits and vegetables each day. At each meal, try to fill half of your plate with fruits and vegetables. ? Up to 6-8 servings of whole grains each day. ? Less than 6 oz of lean meat, poultry, or fish each day. A 3-oz serving of meat is about the same size as a deck of cards. One egg equals 1 oz. ? 2 servings of low-fat dairy each day. ? A serving of nuts, seeds, or beans 5 times each week. ? Heart-healthy fats. Healthy fats called Omega-3 fatty acids are found in foods such as flaxseeds and coldwater fish, like sardines, salmon, and mackerel.  Limit how much you eat of the following: ? Canned or prepackaged foods. ? Food that is high in trans fat, such as fried foods. ? Food that is high in saturated fat, such as fatty meat. ? Sweets, desserts, sugary drinks, and other foods with added sugar. ? Full-fat dairy products.  Do not salt foods before eating.  Try to  eat at least 2 vegetarian meals each week.  Eat more home-cooked food and less restaurant, buffet, and fast food.  When eating at a restaurant, ask that your food be prepared with less salt or no salt, if possible. What foods are recommended? The items listed may not be a complete list. Talk with your dietitian about what dietary choices are best for you. Grains Whole-grain or whole-wheat bread. Whole-grain or whole-wheat pasta. Brown rice. [place]. Bulgur. Whole-grain and low-sodium cereals. Pita bread. Low-fat, low-sodium crackers. Whole-wheat flour tortillas. Vegetables Fresh or frozen vegetables (raw, steamed,  roasted, or grilled). Low-sodium or reduced-sodium tomato and vegetable juice. Low-sodium or reduced-sodium tomato sauce and tomato paste. Low-sodium or reduced-sodium canned vegetables. Fruits All fresh, dried, or frozen fruit. Canned fruit in natural juice (without added sugar). Meat and other protein foods Skinless chicken or [place]. Ground chicken or [place]. Pork with fat trimmed off. Fish and seafood. Egg whites. Dried beans, peas, or lentils. Unsalted nuts, nut butters, and seeds. Unsalted canned beans. Lean cuts of beef with fat trimmed off. Low-sodium, lean deli meat. Dairy Low-fat (1%) or fat-free (skim) milk. Fat-free, low-fat, or reduced-fat cheeses. Nonfat, low-sodium ricotta or cottage cheese. Low-fat or nonfat yogurt. Low-fat, low-sodium cheese. Fats and oils Soft margarine without trans fats. Vegetable oil. Low-fat, reduced-fat, or light mayonnaise and salad dressings (reduced-sodium). Canola, safflower, olive, soybean, and sunflower oils. Avocado. Seasoning and other foods Herbs. Spices. Seasoning mixes without salt. Unsalted popcorn and pretzels. Fat-free sweets. What foods are not recommended? The items listed may not be a complete list. Talk with your dietitian about what dietary choices are best for you. Grains Baked goods made with fat, such as croissants, muffins, or some breads. Dry pasta or rice meal packs. Vegetables Creamed or fried vegetables. Vegetables in a cheese sauce. Regular canned vegetables (not low-sodium or reduced-sodium). Regular canned tomato sauce and paste (not low-sodium or reduced-sodium). Regular tomato and vegetable juice (not low-sodium or reduced-sodium). Angie Fava. Olives. Fruits Canned fruit in a light or heavy syrup. Fried fruit. Fruit in cream or butter sauce. Meat and other protein foods Fatty cuts of meat. Ribs. Fried meat. Berniece Salines. Sausage. Bologna and other processed lunch meats. Salami. Fatback. Hotdogs. Bratwurst. Salted nuts and seeds. Canned  beans with added salt. Canned or smoked fish. Whole eggs or egg yolks. Chicken or [place] with skin. Dairy Whole or 2% milk, cream, and half-and-half. Whole or full-fat cream cheese. Whole-fat or sweetened yogurt. Full-fat cheese. Nondairy creamers. Whipped toppings. Processed cheese and cheese spreads. Fats and oils Butter. Stick margarine. Lard. Shortening. Ghee. Bacon fat. Tropical oils, such as coconut, palm kernel, or palm oil. Seasoning and other foods Salted popcorn and pretzels. Onion salt, garlic salt, seasoned salt, table salt, and sea salt. Worcestershire sauce. Tartar sauce. Barbecue sauce. Teriyaki sauce. Soy sauce, including reduced-sodium. Steak sauce. Canned and packaged gravies. Fish sauce. Oyster sauce. Cocktail sauce. Horseradish that you find on the shelf. Ketchup. Mustard. Meat flavorings and tenderizers. Bouillon cubes. Hot sauce and Tabasco sauce. Premade or packaged marinades. Premade or packaged taco seasonings. Relishes. Regular salad dressings. Where to find more information:  National Heart, Lung, and [place]: https://wilson-eaton.com/  American Heart Association: www.heart.org Summary  The DASH eating plan is a healthy eating plan that has been shown to reduce high blood pressure (hypertension). It may also reduce your risk for type 2 diabetes, heart disease, and stroke.  With the DASH eating plan, you should limit salt (sodium) intake to 2,300 mg a day. If you have  hypertension, you may need to reduce your sodium intake to 1,500 mg a day.  When on the DASH eating plan, aim to eat more fresh fruits and vegetables, whole grains, lean proteins, low-fat dairy, and heart-healthy fats.  Work with your health care provider or diet and nutrition specialist (dietitian) to adjust your eating plan to your individual calorie needs. This information is not intended to replace advice given to you by your health care provider. Make sure you discuss any questions you have with your  health care provider. Document Released: 10/12/2011 Document Revised: 10/16/2016 Document Reviewed: 10/16/2016 Elsevier Interactive Patient Education  [employer].

## 2018-03-15 ENCOUNTER — Other Ambulatory Visit: Payer: Self-pay | Admitting: Nurse Practitioner

## 2018-03-15 DIAGNOSIS — I10 Essential (primary) hypertension: Secondary | ICD-10-CM

## 2018-03-15 NOTE — Telephone Encounter (Signed)
Patient was extremely hypertensive at last visit and asked to come in for blood pressure recheck. Please set up time for patient to come in for a visit to refill his blood pressure medications and ensure his blood pressure is controlled. If no appointments available before medicine is going to run out he can come in for nurse visit for blood pressure recheck but must still have provider appointment as well.

## 2018-03-18 NOTE — Telephone Encounter (Signed)
° °  Pt has been scheduled for 03/20/18  And said he is out of the med and need a refill today   Pharmacy [facility] [place]   [place]

## 2018-03-20 ENCOUNTER — Ambulatory Visit: Payer: Medicare Other | Admitting: Nurse Practitioner

## 2018-03-22 ENCOUNTER — Encounter: Payer: Self-pay | Admitting: Nurse Practitioner

## 2018-03-22 ENCOUNTER — Ambulatory Visit (INDEPENDENT_AMBULATORY_CARE_PROVIDER_SITE_OTHER): Payer: Medicare Other | Admitting: Nurse Practitioner

## 2018-03-22 VITALS — BP 146/82 | HR 100 | Temp 98.2°F | Resp 16 | Ht 68.0 in | Wt 179.8 lb

## 2018-03-22 DIAGNOSIS — Z13220 Encounter for screening for lipoid disorders: Secondary | ICD-10-CM | POA: Diagnosis not present

## 2018-03-22 DIAGNOSIS — E785 Hyperlipidemia, unspecified: Secondary | ICD-10-CM | POA: Diagnosis not present

## 2018-03-22 DIAGNOSIS — I10 Essential (primary) hypertension: Secondary | ICD-10-CM | POA: Diagnosis not present

## 2018-03-22 DIAGNOSIS — E8881 Metabolic syndrome: Secondary | ICD-10-CM | POA: Diagnosis not present

## 2018-03-22 DIAGNOSIS — Z5181 Encounter for therapeutic drug level monitoring: Secondary | ICD-10-CM | POA: Diagnosis not present

## 2018-03-22 DIAGNOSIS — L309 Dermatitis, unspecified: Secondary | ICD-10-CM

## 2018-03-22 DIAGNOSIS — R7989 Other specified abnormal findings of blood chemistry: Secondary | ICD-10-CM | POA: Diagnosis not present

## 2018-03-22 MED ORDER — TRIAMCINOLONE ACETONIDE 0.025 % EX CREA: 1.0000 "application " | TOPICAL_CREAM | Freq: Two times a day (BID) | CUTANEOUS | 1 refills | Status: DC

## 2018-03-22 MED ORDER — TRIAMTERENE-HCTZ 37.5-25 MG PO TABS: 1.0000 | ORAL_TABLET | Freq: Every day | ORAL | 0 refills | Status: DC

## 2018-03-22 MED ORDER — BENAZEPRIL HCL 20 MG PO TABS: 40.0000 mg | ORAL_TABLET | Freq: Every day | ORAL | 0 refills | Status: DC

## 2018-03-22 NOTE — Progress Notes (Addendum)
Name: Dennis Ford   MRN: [id number]    DOB: 1941-06-26   Date:03/22/2018       Progress Note  Subjective  Chief Complaint  Chief Complaint  Patient presents with  . Hypertension  . Medication Refill    HPI  Hypertension Patient is only taking benazepril  states not taking maxide because it didn't agree with him-but doesn't remember symptoms, states tried to increase doze of benazepril to  and then shortly after had palpitations and felt ill- did not call 911 sat under the fan - lasted 1-2 hours and self resolved. Has been taking the benazepril  dose since. States has post-surgical bilateral hernia surgery pain from 8-10 years ago that comes up when he does a lot of bending and lifting and turning for work- states he thinks that is what is causing his blood pressure to go up.   Denies chest pain, shob since episode of  of benazepril. Denies dizziness, lightheadedness, headaches.   Diet: fried chicken, stewed cabbage, green beans, snap beans, okra, apples, oranges, grapes   Has tried amlodipine, bystolic, coreg, hydralazine, triamterene-hctz sts all have had negative side effects and doesn't like being on multiple medications.   Patient Active Problem List   Diagnosis Date Noted  . Intermittent low back pain 12/20/2016  . Hyperglycemia 06/01/2016  . Hypertension, benign 05/31/2016  . Dermatitis 05/31/2016  . 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 Abnormal TSH 06/04/2015    Past Medical History:  Diagnosis Date  . Diabetes mellitus without complication (HCC)   . Goiter   . History of inguinal hernia repair   . Hyperlipidemia   . Hypertension     Past Surgical History:  Procedure Laterality Date  . COLONOSCOPY    . CYST EXCISION Left 2008   Upper Back on Left Shoulder  . HERNIA REPAIR     [facility]   . INGUINAL HERNIA REPAIR  11/27/2011   Procedure: HERNIA REPAIR INGUINAL ADULT;  Surgeon: Jetty Duhamel, MD;  Location: [facility];  Service: General;   Laterality: Right;  right inguinal hernia repair with mesh    Social History   Tobacco Use  . Smoking status: Former Smoker    Types: Cigars    Last attempt to quit: 11/22/1988    Years since quitting: 29.3  . Smokeless tobacco: Never Used  . Tobacco comment: Smokes off and on   Substance Use Topics  . Alcohol use: No    Comment: he quit 10/2016     Current Outpatient Medications:  .  allopurinol (ZYLOPRIM) 300 MG tablet, TAKE 1 TABLET BY MOUTH EVERY DAY, Disp: 90 tablet, Rfl: 1 .  benazepril (LOTENSIN) 20 MG tablet, Take 2 tablets (40 mg total) by mouth daily., Disp: 28 tablet, Rfl: 0 .  triamcinolone (KENALOG) 0.025 % cream, Apply 1 application topically 2 (two) times daily., Disp: 30 g, Rfl: 1 .  aspirin EC 81 MG tablet, Take 1 tablet (81 mg total) by mouth daily. (Patient not taking: Reported on 08/22/2017), Disp: 30 tablet, Rfl: 0 .  baclofen (LIORESAL) 10 MG tablet, Take 1 tablet (10 mg total) by mouth 3 (three) times daily. (Patient not taking: Reported on 03/22/2018), Disp: 30 each, Rfl: 0 .  triamterene-hydrochlorothiazide (MAXZIDE-25) 37.5-25 MG tablet, Take 1 tablet by mouth daily., Disp: 30 tablet, Rfl: 0  Allergies  Allergen Reactions  . Norvasc [Amlodipine] Other (See Comments)    angioedema    ROS  Constitutional: Negative for fever or weight change.  Respiratory: Negative  for cough and shortness of breath.   Cardiovascular: Negative for chest pain or palpitations.  Gastrointestinal: Negative for abdominal pain, no bowel changes.  Musculoskeletal: Negative for gait problem or joint swelling.  Skin: Negative for rash.  Neurological: Negative for dizziness or headache.  No other specific complaints in a complete review of systems (except as listed in HPI above).  Objective  Vitals:   03/22/18 1029 03/22/18 1110  BP: (!) 160/82 (!) 146/82  Pulse: 100   Resp: 16   Temp: 98.2 F (36.8 C)   TempSrc: Oral   SpO2: 97%   Weight: 179 lb 12.8 oz (81.6 kg)    Height:  (1.727 m)      Body mass index is 27.34 kg/m.  Nursing Note and Vital Signs reviewed.  Physical Exam  Constitutional: Patient appears well-developed and well-nourished.No distress. Goiter noted. Cardiovascular: Normal rate, regular rhythm, S1/S2 present.  No murmur or rub heard. No carotid bruits, bounding radial pulses, no edema Pulmonary/Chest: Effort normal and breath sounds clear. No respiratory distress or retractions. Abdominal: Soft and non-tender, bowel sounds present  Skin: Rash resolved- works with posion ivy in garden takes PRN Psychiatric: Patient has a normal mood and affect. behavior is normal. Judgment and thought content normal.  No results found for this or any previous visit (from the past 72 hour(s)).  Assessment & Plan  1. Uncontrolled hypertension Discussed at length implications of uncontrolled hypertension- and end-organ damage. Patient willing to continue  ACEi and restart maxide and follow-up with nephrology for further management of hypertension. Discussed DASH diet - Basic Metabolic Panel (BMET). - Urine Microalbumin w/creat. ratio - benazepril (LOTENSIN) 20 MG tablet; Take 2 tablets (40 mg total) by mouth daily.  Dispense: 28 tablet; Refill: 0 - triamterene-hydrochlorothiazide (MAXZIDE-25) 37.5-25 MG tablet; Take 1 tablet by mouth daily.  Dispense: 30 tablet; Refill: 0 - Ambulatory referral to Nephrology  2. Abnormal TSH -discussed importance of following up with endo and implications of thyroid on BP  - Thyroid Panel With TSH  3. Medication monitoring encounter - Basic Metabolic Panel (BMET)  4. Dermatitis -stable uses med PRN due to garden work  - triamcinolone (KENALOG) 0.025 % cream; Apply 1 application topically 2 (two) times daily.  Dispense: 30 g; Refill: 1  5. Screening for lipid disorders Discussed diet  - Lipid Profile  Face-to-face time with patient was more than 25 minutes, >50% time spent counseling and  coordination of care  -Red flags and when to present for emergency care or RTC including fever >101.44F, chest pain, shortness of breath, new/worsening/un-resolving symptoms, stroke symptoms reviewed with patient at time of visit. Follow up and care instructions discussed and provided in AVS.  I have reviewed this encounter including the documentation in this note and/or discussed this patient with the provider, Sharyon Cable DNP AGNP-C. I am certifying that I agree with the content of this note as supervising physician. Alba Cory, MD [facility] 03/22/2018, 1:57 PM

## 2018-03-22 NOTE — Patient Instructions (Addendum)
DASH Eating Plan DASH stands for "Dietary Approaches to Stop Hypertension." The DASH eating plan is a healthy eating plan that has been shown to reduce high blood pressure (hypertension). It may also reduce your risk for type 2 diabetes, heart disease, and stroke. The DASH eating plan may also help with weight loss. What are tips for following this plan? General guidelines  Avoid eating more than 2,300 mg (milligrams) of salt (sodium) a day. If you have hypertension, you may need to reduce your sodium intake to 1,500 mg a day.  Limit alcohol intake to no more than 1 drink a day for nonpregnant women and 2 drinks a day for men. One drink equals 12 oz of beer, 5 oz of wine, or 1 oz of hard liquor.  Work with your health care provider to maintain a healthy body weight or to lose weight. Ask what an ideal weight is for you.  Get at least 30 minutes of exercise that causes your heart to beat faster (aerobic exercise) most days of the week. Activities may include walking, swimming, or biking.  Work with your health care provider or diet and nutrition specialist (dietitian) to adjust your eating plan to your individual calorie needs. Reading food labels  Check food labels for the amount of sodium per serving. Choose foods with less than 5 percent of the Daily Value of sodium. Generally, foods with less than 300 mg of sodium per serving fit into this eating plan.  To find whole grains, look for the word "whole" as the first word in the ingredient list. Shopping  Buy products labeled as "low-sodium" or "no salt added."  Buy fresh foods. Avoid canned foods and premade or frozen meals. Cooking  Avoid adding salt when cooking. Use salt-free seasonings or herbs instead of table salt or sea salt. Check with your health care provider or pharmacist before using salt substitutes.  Do not fry foods. Cook foods using healthy methods such as baking, boiling, grilling, and broiling instead.  Cook with  heart-healthy oils, such as olive, canola, soybean, or sunflower oil. Meal planning   Eat a balanced diet that includes: ? 5 or more servings of fruits and vegetables each day. At each meal, try to fill half of your plate with fruits and vegetables. ? Up to 6-8 servings of whole grains each day. ? Less than 6 oz of lean meat, poultry, or fish each day. A 3-oz serving of meat is about the same size as a deck of cards. One egg equals 1 oz. ? 2 servings of low-fat dairy each day. ? A serving of nuts, seeds, or beans 5 times each week. ? Heart-healthy fats. Healthy fats called Omega-3 fatty acids are found in foods such as flaxseeds and coldwater fish, like sardines, salmon, and mackerel.  Limit how much you eat of the following: ? Canned or prepackaged foods. ? Food that is high in trans fat, such as fried foods. ? Food that is high in saturated fat, such as fatty meat. ? Sweets, desserts, sugary drinks, and other foods with added sugar. ? Full-fat dairy products.  Do not salt foods before eating.  Try to eat at least 2 vegetarian meals each week.  Eat more home-cooked food and less restaurant, buffet, and fast food.  When eating at a restaurant, ask that your food be prepared with less salt or no salt, if possible. What foods are recommended? The items listed may not be a complete list. Talk with your dietitian about what   dietary choices are best for you. Grains Whole-grain or whole-wheat bread. Whole-grain or whole-wheat pasta. Brown rice. Oatmeal. Quinoa. Bulgur. Whole-grain and low-sodium cereals. Pita bread. Low-fat, low-sodium crackers. Whole-wheat flour tortillas. Vegetables Fresh or frozen vegetables (raw, steamed, roasted, or grilled). Low-sodium or reduced-sodium tomato and vegetable juice. Low-sodium or reduced-sodium tomato sauce and tomato paste. Low-sodium or reduced-sodium canned vegetables. Fruits All fresh, dried, or frozen fruit. Canned fruit in natural juice (without  added sugar). Meat and other protein foods Skinless chicken or turkey. Ground chicken or turkey. Pork with fat trimmed off. Fish and seafood. Egg whites. Dried beans, peas, or lentils. Unsalted nuts, nut butters, and seeds. Unsalted canned beans. Lean cuts of beef with fat trimmed off. Low-sodium, lean deli meat. Dairy Low-fat (1%) or fat-free (skim) milk. Fat-free, low-fat, or reduced-fat cheeses. Nonfat, low-sodium ricotta or cottage cheese. Low-fat or nonfat yogurt. Low-fat, low-sodium cheese. Fats and oils Soft margarine without trans fats. Vegetable oil. Low-fat, reduced-fat, or light mayonnaise and salad dressings (reduced-sodium). Canola, safflower, olive, soybean, and sunflower oils. Avocado. Seasoning and other foods Herbs. Spices. Seasoning mixes without salt. Unsalted popcorn and pretzels. Fat-free sweets. What foods are not recommended? The items listed may not be a complete list. Talk with your dietitian about what dietary choices are best for you. Grains Baked goods made with fat, such as croissants, muffins, or some breads. Dry pasta or rice meal packs. Vegetables Creamed or fried vegetables. Vegetables in a cheese sauce. Regular canned vegetables (not low-sodium or reduced-sodium). Regular canned tomato sauce and paste (not low-sodium or reduced-sodium). Regular tomato and vegetable juice (not low-sodium or reduced-sodium). Pickles. Olives. Fruits Canned fruit in a light or heavy syrup. Fried fruit. Fruit in cream or butter sauce. Meat and other protein foods Fatty cuts of meat. Ribs. Fried meat. Bacon. Sausage. Bologna and other processed lunch meats. Salami. Fatback. Hotdogs. Bratwurst. Salted nuts and seeds. Canned beans with added salt. Canned or smoked fish. Whole eggs or egg yolks. Chicken or turkey with skin. Dairy Whole or 2% milk, cream, and half-and-half. Whole or full-fat cream cheese. Whole-fat or sweetened yogurt. Full-fat cheese. Nondairy creamers. Whipped toppings.  Processed cheese and cheese spreads. Fats and oils Butter. Stick margarine. Lard. Shortening. Ghee. Bacon fat. Tropical oils, such as coconut, palm kernel, or palm oil. Seasoning and other foods Salted popcorn and pretzels. Onion salt, garlic salt, seasoned salt, table salt, and sea salt. Worcestershire sauce. Tartar sauce. Barbecue sauce. Teriyaki sauce. Soy sauce, including reduced-sodium. Steak sauce. Canned and packaged gravies. Fish sauce. Oyster sauce. Cocktail sauce. Horseradish that you find on the shelf. Ketchup. Mustard. Meat flavorings and tenderizers. Bouillon cubes. Hot sauce and Tabasco sauce. Premade or packaged marinades. Premade or packaged taco seasonings. Relishes. Regular salad dressings. Where to find more information:  National Heart, Lung, and Blood Institute: www.nhlbi.nih.gov  American Heart Association: www.heart.org Summary  The DASH eating plan is a healthy eating plan that has been shown to reduce high blood pressure (hypertension). It may also reduce your risk for type 2 diabetes, heart disease, and stroke.  With the DASH eating plan, you should limit salt (sodium) intake to 2,300 mg a day. If you have hypertension, you may need to reduce your sodium intake to 1,500 mg a day.  When on the DASH eating plan, aim to eat more fresh fruits and vegetables, whole grains, lean proteins, low-fat dairy, and heart-healthy fats.  Work with your health care provider or diet and nutrition specialist (dietitian) to adjust your eating plan to your individual   calorie needs. This information is not intended to replace advice given to you by your health care provider. Make sure you discuss any questions you have with your health care provider. Document Released: 10/12/2011 Document Revised: 10/16/2016 Document Reviewed: 10/16/2016 Elsevier Interactive Patient Education  2018 Elsevier Inc.  

## 2018-03-27 ENCOUNTER — Other Ambulatory Visit: Payer: Self-pay | Admitting: Nurse Practitioner

## 2018-03-27 DIAGNOSIS — E049 Nontoxic goiter, unspecified: Secondary | ICD-10-CM

## 2018-03-27 DIAGNOSIS — R7989 Other specified abnormal findings of blood chemistry: Secondary | ICD-10-CM

## 2018-03-27 DIAGNOSIS — E040 Nontoxic diffuse goiter: Secondary | ICD-10-CM

## 2018-03-28 LAB — THYROID PANEL WITH TSH
FREE THYROXINE INDEX: 1.6 (ref 1.4–3.8)
T3 Uptake: 30 % (ref 22–35)
T4, Total: 5.3 ug/dL (ref 4.9–10.5)
TSH: 5.71 mIU/L — ABNORMAL HIGH (ref 0.40–4.50)

## 2018-03-28 LAB — BASIC METABOLIC PANEL
BUN: 12 mg/dL (ref 7–25)
CALCIUM: 9.6 mg/dL (ref 8.6–10.3)
CHLORIDE: 106 mmol/L (ref 98–110)
CO2: 25 mmol/L (ref 20–32)
Creat: 1 mg/dL (ref 0.70–1.18)
GLUCOSE: 95 mg/dL (ref 65–99)
Potassium: 4.5 mmol/L (ref 3.5–5.3)
SODIUM: 139 mmol/L (ref 135–146)

## 2018-03-28 LAB — LIPID PANEL
Cholesterol: 177 mg/dL (ref <200)
HDL: 53 mg/dL (ref >40)
LDL CHOLESTEROL (CALC): 101 mg/dL — AB
NON-HDL CHOLESTEROL (CALC): 124 mg/dL (ref <130)
Total CHOL/HDL Ratio: 3.3 (calc) (ref <5.0)
Triglycerides: 130 mg/dL (ref <150)

## 2018-03-28 LAB — MICROALBUMIN / CREATININE URINE RATIO
CREATININE, URINE: 200 mg/dL (ref 20–320)
MICROALB UR: 1.6 mg/dL
Microalb Creat Ratio: 8 mcg/mg creat (ref <30)

## 2018-04-18 ENCOUNTER — Telehealth: Payer: Self-pay | Admitting: Emergency Medicine

## 2018-04-18 ENCOUNTER — Other Ambulatory Visit: Payer: Self-pay | Admitting: Nurse Practitioner

## 2018-04-18 DIAGNOSIS — I10 Essential (primary) hypertension: Secondary | ICD-10-CM

## 2018-04-18 MED ORDER — BENAZEPRIL HCL 20 MG PO TABS: 40.0000 mg | ORAL_TABLET | Freq: Every day | ORAL | 0 refills | Status: DC

## 2018-04-18 MED ORDER — TRIAMTERENE-HCTZ 37.5-25 MG PO TABS: 1.0000 | ORAL_TABLET | Freq: Every day | ORAL | 0 refills | Status: DC

## 2018-04-18 NOTE — Telephone Encounter (Signed)
Patient came by and stated he need refills on BP medication Benazepril 20 mg called to pharmacy. He only received a 28 days supply but now need a 90 day supply.

## 2018-04-18 NOTE — Telephone Encounter (Signed)
Please call _____ Patient was hypertensive and not willing to try new medications and referred to nephrology for further management of uncontrolled hypertension. Will fill 2 month supply of medications but he will need to come in for appointment here to monitor his blood pressure or follow-up with nephrology for further refills so we can ensure that his blood pressures is being adequately treated.

## 2018-04-19 NOTE — Telephone Encounter (Signed)
Patient notified and he is keeping a log of BP checks and will bring in

## 2018-05-18 ENCOUNTER — Other Ambulatory Visit: Payer: Self-pay | Admitting: Nurse Practitioner

## 2018-05-18 DIAGNOSIS — I10 Essential (primary) hypertension: Secondary | ICD-10-CM

## 2018-06-13 ENCOUNTER — Other Ambulatory Visit: Payer: Self-pay

## 2018-06-13 NOTE — Patient Outreach (Signed)
[facility] Management  06/13/2018  Dennis MinRobert Yeomans November 25, 1940 [id number]   Medication Adherence call to Dennis Ford patient did not answer patient's telephone number under [employer] is Disconnected patient is due on Benazepril 20 mg. Dennis Ford is showing past due under [employer].    Dennis AbedAna Ford CPhT Pharmacy Technician [facility] Care Management Direct Dial [phone]  Fax [phone] [email or web address]

## 2018-08-24 ENCOUNTER — Other Ambulatory Visit: Payer: Self-pay | Admitting: Nurse Practitioner

## 2018-08-24 DIAGNOSIS — I10 Essential (primary) hypertension: Secondary | ICD-10-CM

## 2018-10-28 ENCOUNTER — Emergency Department: Payer: Medicare Other

## 2018-10-28 ENCOUNTER — Observation Stay
Admission: EM | Admit: 2018-10-28 | Discharge: 2018-10-29 | Disposition: A | Discharge status: Home / Self Care | Payer: Medicare Other | Attending: Internal Medicine | Admitting: Internal Medicine

## 2018-10-28 DIAGNOSIS — Z87891 Personal history of nicotine dependence: Secondary | ICD-10-CM | POA: Diagnosis not present

## 2018-10-28 DIAGNOSIS — Z79899 Other long term (current) drug therapy: Secondary | ICD-10-CM | POA: Insufficient documentation

## 2018-10-28 DIAGNOSIS — R7989 Other specified abnormal findings of blood chemistry: Secondary | ICD-10-CM | POA: Diagnosis not present

## 2018-10-28 DIAGNOSIS — R29898 Other symptoms and signs involving the musculoskeletal system: Secondary | ICD-10-CM | POA: Diagnosis present

## 2018-10-28 DIAGNOSIS — E1165 Type 2 diabetes mellitus with hyperglycemia: Secondary | ICD-10-CM | POA: Diagnosis not present

## 2018-10-28 DIAGNOSIS — I10 Essential (primary) hypertension: Secondary | ICD-10-CM | POA: Diagnosis not present

## 2018-10-28 DIAGNOSIS — E785 Hyperlipidemia, unspecified: Secondary | ICD-10-CM | POA: Diagnosis not present

## 2018-10-28 DIAGNOSIS — E119 Type 2 diabetes mellitus without complications: Secondary | ICD-10-CM | POA: Diagnosis not present

## 2018-10-28 DIAGNOSIS — R51 Headache: Secondary | ICD-10-CM | POA: Diagnosis not present

## 2018-10-28 DIAGNOSIS — R531 Weakness: Secondary | ICD-10-CM | POA: Diagnosis present

## 2018-10-28 DIAGNOSIS — I161 Hypertensive emergency: Secondary | ICD-10-CM | POA: Diagnosis not present

## 2018-10-28 DIAGNOSIS — I639 Cerebral infarction, unspecified: Principal | ICD-10-CM | POA: Insufficient documentation

## 2018-10-28 DIAGNOSIS — Z7982 Long term (current) use of aspirin: Secondary | ICD-10-CM | POA: Insufficient documentation

## 2018-10-28 LAB — COMPREHENSIVE METABOLIC PANEL
ALT: 12 U/L (ref 0–44)
AST: 20 U/L (ref 15–41)
Albumin: 4.2 g/dL (ref 3.5–5.0)
Alkaline Phosphatase: 48 U/L (ref 38–126)
Anion gap: 7 (ref 5–15)
BUN: 11 mg/dL (ref 8–23)
CHLORIDE: 105 mmol/L (ref 98–111)
CO2: 26 mmol/L (ref 22–32)
Calcium: 8.8 mg/dL — ABNORMAL LOW (ref 8.9–10.3)
Creatinine, Ser: 1.03 mg/dL (ref 0.61–1.24)
GFR calc Af Amer: >60 mL/min (ref >60)
GFR calc non Af Amer: >60 mL/min (ref >60)
Glucose, Bld: 98 mg/dL (ref 70–99)
Potassium: 4 mmol/L (ref 3.5–5.1)
Sodium: 138 mmol/L (ref 135–145)
Total Bilirubin: 0.7 mg/dL (ref 0.3–1.2)
Total Protein: 8.2 g/dL — ABNORMAL HIGH (ref 6.5–8.1)

## 2018-10-28 LAB — CBC
HCT: 53.2 % — ABNORMAL HIGH (ref 39.0–52.0)
Hemoglobin: 16.5 g/dL (ref 13.0–17.0)
MCH: 27.1 pg (ref 26.0–34.0)
MCHC: 31 g/dL (ref 30.0–36.0)
MCV: 87.5 fL (ref 80.0–100.0)
Platelets: 281 10*3/uL (ref 150–400)
RBC: 6.08 MIL/uL — ABNORMAL HIGH (ref 4.22–5.81)
RDW: 15.7 % — ABNORMAL HIGH (ref 11.5–15.5)
WBC: 4.8 10*3/uL (ref 4.0–10.5)
nRBC: 0 % (ref 0.0–0.2)

## 2018-10-28 LAB — DIFFERENTIAL
ABS IMMATURE GRANULOCYTES: 0.03 10*3/uL (ref 0.00–0.07)
BASOS PCT: 1 %
Basophils Absolute: 0.1 10*3/uL (ref 0.0–0.1)
Eosinophils Absolute: 0.1 10*3/uL (ref 0.0–0.5)
Eosinophils Relative: 2 %
Immature Granulocytes: 1 %
Lymphocytes Relative: 38 %
Lymphs Abs: 1.8 10*3/uL (ref 0.7–4.0)
MONOS PCT: 11 %
Monocytes Absolute: 0.5 10*3/uL (ref 0.1–1.0)
Neutro Abs: 2.3 10*3/uL (ref 1.7–7.7)
Neutrophils Relative %: 47 %

## 2018-10-28 LAB — PROTIME-INR
INR: 0.98
Prothrombin Time: 12.9 seconds (ref 11.4–15.2)

## 2018-10-28 LAB — TROPONIN I

## 2018-10-28 LAB — APTT: aPTT: 30 seconds (ref 24–36)

## 2018-10-28 MED ORDER — ATORVASTATIN CALCIUM 20 MG PO TABS
40.0000 mg | ORAL_TABLET | Freq: Every day | ORAL | Status: DC
  Administered 2018-10-28: 21:00:00 40 mg via ORAL
  Filled 2018-10-28: qty 2

## 2018-10-28 MED ORDER — ENOXAPARIN SODIUM 40 MG/0.4ML ~~LOC~~ SOLN: 40.0000 mg | SUBCUTANEOUS | Status: DC

## 2018-10-28 MED ORDER — ACETAMINOPHEN 160 MG/5ML PO SOLN
650.0000 mg | ORAL | Status: DC | PRN
  Filled 2018-10-28: qty 20.3

## 2018-10-28 MED ORDER — ACETAMINOPHEN 325 MG PO TABS: 650.0000 mg | ORAL_TABLET | ORAL | Status: DC | PRN

## 2018-10-28 MED ORDER — LABETALOL HCL 5 MG/ML IV SOLN
10.0000 mg | Freq: Once | INTRAVENOUS | Status: AC
  Administered 2018-10-28: 10 mg via INTRAVENOUS
  Filled 2018-10-28: qty 4

## 2018-10-28 MED ORDER — SENNOSIDES-DOCUSATE SODIUM 8.6-50 MG PO TABS: 1.0000 | ORAL_TABLET | Freq: Every evening | ORAL | Status: DC | PRN

## 2018-10-28 MED ORDER — ASPIRIN EC 81 MG PO TBEC
81.0000 mg | DELAYED_RELEASE_TABLET | Freq: Every day | ORAL | Status: DC
  Administered 2018-10-29: 81 mg via ORAL
  Filled 2018-10-28: qty 1

## 2018-10-28 MED ORDER — HYDRALAZINE HCL 20 MG/ML IJ SOLN: 5.0000 mg | INTRAMUSCULAR | Status: DC | PRN

## 2018-10-28 MED ORDER — BENAZEPRIL HCL 20 MG PO TABS
20.0000 mg | ORAL_TABLET | Freq: Every day | ORAL | Status: DC
  Administered 2018-10-28 – 2018-10-29 (×2): 20 mg via ORAL
  Filled 2018-10-28 (×2): qty 1

## 2018-10-28 MED ORDER — BENAZEPRIL HCL 20 MG PO TABS: 20.0000 mg | ORAL_TABLET | Freq: Every day | ORAL | Status: DC

## 2018-10-28 MED ORDER — HYDRALAZINE HCL 20 MG/ML IJ SOLN
5.0000 mg | Freq: Once | INTRAMUSCULAR | Status: AC
  Administered 2018-10-28: 5 mg via INTRAVENOUS
  Filled 2018-10-28: qty 1

## 2018-10-28 MED ORDER — STROKE: EARLY STAGES OF RECOVERY BOOK
Freq: Once | Status: AC
  Administered 2018-10-28: 21:00:00

## 2018-10-28 MED ORDER — ACETAMINOPHEN 650 MG RE SUPP: 650.0000 mg | RECTAL | Status: DC | PRN

## 2018-10-28 MED ORDER — ASPIRIN 81 MG PO CHEW
324.0000 mg | CHEWABLE_TABLET | Freq: Once | ORAL | Status: AC
  Administered 2018-10-28: 324 mg via ORAL
  Filled 2018-10-28: qty 4

## 2018-10-28 NOTE — ED Triage Notes (Signed)
Pt reports last pm started with high blood pressure and weakness in his right arm. Pt denies CP, SOB, HA or other sx's. Pt face symetrical, no dropping noted. Grips equal.

## 2018-10-28 NOTE — H&P (Signed)
[facility] [place] [facility]   PATIENT NAME: Dennis Ford    MR#:  [id number]  DATE OF BIRTH:  1941/08/28  DATE OF ADMISSION:  10/28/2018  PRIMARY CARE PHYSICIAN: Alba CorySowles, Krichna, MD   REQUESTING/REFERRING PHYSICIAN: Sharyn CreamerMark Quale, MD  CHIEF COMPLAINT:   Chief Complaint  Patient presents with  . Extremity Weakness  . Hypertension    HISTORY OF PRESENT ILLNESS:  Dennis Ford  is a 77 y.o. male with a known history of hypertension, hyperlipidemia, type 2 diabetes who presented to the ED with right hand weakness that started last night.  He states he was watching TV when he all of a sudden noticed.  He could not grip anything.  He also noticed some numbness and tingling right arm and hand. Today, he noted that his right hand weakness was better, but still not back to normal.  He denies any numbness or tingling of his right arm and hand today.  No visual changes, no slurred speech, no confusion.  In the ED, BPs were elevated to 229/127.  Labs are unremarkable.  CT head negative for acute abnormalities.  He was given aspirin 324 mg p.o. x1.  Hospitalists were called for admission.  PAST MEDICAL HISTORY:   Past Medical History:  Diagnosis Date  . Diabetes mellitus without complication (HCC)   . Goiter   . History of inguinal hernia repair   . Hyperlipidemia   . Hypertension     PAST SURGICAL HISTORY:   Past Surgical History:  Procedure Laterality Date  . COLONOSCOPY    . CYST EXCISION Left 2008   Upper Back on Left Shoulder  . HERNIA REPAIR     LIH   . INGUINAL HERNIA REPAIR  11/27/2011   Procedure: HERNIA REPAIR INGUINAL ADULT;  Surgeon: Jetty DuhamelJames O Wyatt III, MD;  Location: [facility];  Service: General;  Laterality: Right;  right inguinal hernia repair with mesh    SOCIAL HISTORY:   Social History   Tobacco Use  . Smoking status: Former Smoker    Types: Cigars    Last attempt to quit: 11/22/1988    Years since quitting: 29.9    . Smokeless tobacco: Never Used  . Tobacco comment: Smokes off and on   Substance Use Topics  . Alcohol use: No    Comment: he quit 10/2016    FAMILY HISTORY:   Family History  Problem Relation Age of Onset  . Breast cancer Mother     DRUG ALLERGIES:   Allergies  Allergen Reactions  . Norvasc [Amlodipine] Other (See Comments)    angioedema    REVIEW OF SYSTEMS:   Review of Systems  Constitutional: Negative for chills and fever.  HENT: Negative for congestion and sore throat.   Eyes: Negative for blurred vision and double vision.  Respiratory: Negative for cough and shortness of breath.   Cardiovascular: Negative for chest pain, palpitations and leg swelling.  Gastrointestinal: Negative for abdominal pain, nausea and vomiting.  Genitourinary: Negative for dysuria and urgency.  Musculoskeletal: Negative for myalgias and neck pain.  Neurological: Positive for tingling, sensory change and focal weakness. Negative for dizziness, speech change and headaches.  Psychiatric/Behavioral: Negative for depression. The patient is not nervous/anxious.     MEDICATIONS AT HOME:   Prior to Admission medications   Medication Sig Start Date End Date Taking? Authorizing Provider  allopurinol (ZYLOPRIM) 300 MG tablet TAKE 1 TABLET BY MOUTH EVERY DAY Patient taking differently: Take 300 mg by mouth  daily as needed (for gout flare-ups).  07/30/17  Yes Alba Cory, MD  aspirin EC 81 MG tablet Take 1 tablet (81 mg total) by mouth daily. 12/20/16  Yes Sowles, Danna Hefty, MD  benazepril (LOTENSIN) 20 MG tablet TAKE 2 TABLETS BY MOUTH EVERY DAY Patient taking differently: Take 20 mg by mouth daily.  08/26/18  Yes Poulose, Percell Belt, NP  triamcinolone (KENALOG) 0.025 % cream Apply 1 application topically 2 (two) times daily. Patient taking differently: Apply 1 application topically 2 (two) times daily as needed (for inflammation/itching).  03/22/18  Yes Poulose, Percell Belt, NP  baclofen  (LIORESAL) 10 MG tablet Take 1 tablet (10 mg total) by mouth 3 (three) times daily. Patient not taking: Reported on 10/28/2018 02/13/18   Cheryle Horsfall, NP  triamterene-hydrochlorothiazide (MAXZIDE-25) 37.5-25 MG tablet Take 1 tablet by mouth daily. Patient not taking: Reported on 10/28/2018 04/18/18   Cheryle Horsfall, NP      VITAL SIGNS:  Blood pressure (!) 229/127, pulse 88, temperature 99 F (37.2 C), temperature source Oral, resp. rate 16, height 5\' 8"  (1.727 m), weight 79.4 kg, SpO2 98 %.  PHYSICAL EXAMINATION:  Physical Exam  GENERAL:  77 y.o.-year-old patient lying in the bed with no acute distress.  EYES: Pupils equal, round, reactive to light and accommodation. No scleral icterus. Extraocular muscles intact.  HEENT: Head atraumatic, normocephalic. Oropharynx and nasopharynx clear.  NECK:  Supple, no jugular venous distention. No thyroid enlargement, no tenderness.  LUNGS: Normal breath sounds bilaterally, no wheezing, rales,rhonchi or crepitation. No use of accessory muscles of respiration.  CARDIOVASCULAR: RRR, S1, S2 normal. No murmurs, rubs, or gallops.  ABDOMEN: Soft, nontender, nondistended. Bowel sounds present. No organomegaly or mass.  EXTREMITIES: No pedal edema, cyanosis, or clubbing.  NEUROLOGIC: Cranial nerves II through XII are intact. 4/5 grip strength of the right hand. Muscle strength otherwise 5/5 in all extremities. Sensation intact. Normal finger-to-nose testing. Gait not checked.  PSYCHIATRIC: The patient is alert and oriented x 3.  SKIN: No obvious rash, lesion, or ulcer.   LABORATORY PANEL:   CBC Recent Labs  Lab 10/28/18 1337  WBC 4.8  HGB 16.5  HCT 53.2*  PLT 281   ------------------------------------------------------------------------------------------------------------------  Chemistries  Recent Labs  Lab 10/28/18 1337  NA 138  K 4.0  CL 105  CO2 26  GLUCOSE 98  BUN 11  CREATININE 1.03  CALCIUM 8.8*  AST 20  ALT 12    ALKPHOS 48  BILITOT 0.7   ------------------------------------------------------------------------------------------------------------------  Cardiac Enzymes Recent Labs  Lab 10/28/18 1337  TROPONINI <0.03   ------------------------------------------------------------------------------------------------------------------  RADIOLOGY:  Ct Head Wo Contrast  Result Date: 10/28/2018 CLINICAL DATA:  Chronic headache EXAM: CT HEAD WITHOUT CONTRAST TECHNIQUE: Contiguous axial images were obtained from the base of the skull through the vertex without intravenous contrast. COMPARISON:  None. FINDINGS: Brain: No evidence of acute infarction, hemorrhage, hydrocephalus, extra-axial collection or mass lesion/mass effect. Subcortical white matter and periventricular small vessel ischemic changes. Vascular: Mild intracranial atherosclerosis. Skull: Normal. Negative for fracture or focal lesion. Sinuses/Orbits: The visualized paranasal sinuses are essentially clear. The mastoid air cells are unopacified. Other: None. IMPRESSION: No evidence of acute intracranial abnormality. Mild small vessel ischemic changes. Electronically Signed   By: Charline Bills M.D.   On: 10/28/2018 14:01      IMPRESSION AND PLAN:   Right hand weakness-concern for TIA versus stroke.  CT head without any acute abnormalities. -MRI brain -Echo -Carotid Dopplers -Lipid panel and A1c -Continue aspirin 81 mg  daily -Start Lipitor 40mg  -Neuro consult -PT/OT consult  Hypertensive emergency- BP 229/127 in the ED. -Continue home benazepril -Hydralazine IV as needed  Type 2 diabetes- glucose normal in the ED. Not on any diabetes meds at home. -Check A1c -Hold off on SSI for now  History of abnormal TSH- not on Synthroid at home. -Check TSH, T4  All the records are reviewed and case discussed with ED provider. Management plans discussed with the patient, family and they are in agreement.  CODE STATUS: Full  TOTAL  TIME TAKING CARE OF THIS PATIENT: 45 minutes.    Jinny BlossomKaty D Mayo M.D on 10/28/2018 at 7:17 PM  Between 7am to 6pm - Pager - [phone]  After 6pm go to [email or web address] - Scientist, research (life sciences)password [email or web address]  [facility]  Office  [phone]  CC: Primary care physician; Alba CorySowles, Krichna, MD   Note: This dictation was prepared with Dragon dictation along with smaller phrase technology. Any transcriptional errors that result from this process are unintentional.

## 2018-10-28 NOTE — ED Notes (Signed)
Per lab no blue top tube was sent, needs to be drawn.

## 2018-10-28 NOTE — Progress Notes (Addendum)
Notified MD of BP 187/103 after 5mg  IV hydral.   Mayo, MD ordered 1x dose of IV labetalol 10mg --see orders

## 2018-10-28 NOTE — ED Notes (Signed)
Report received from CelesteLorrie, [place]RN. Patient care transferred at this time.

## 2018-10-28 NOTE — ED Provider Notes (Signed)
[facility] Emergency Department Provider Note  ____________________________________________   First MD Initiated Contact with Patient 10/28/18 1551     (approximate)  I have reviewed the triage vital signs and the nursing notes.   HISTORY  Chief Complaint Extremity Weakness and Hypertension   HPI Dennis Ford is a 77 y.o. male and presents for evaluation of right arm weakness.  Patient reports he was sitting in his chair watching television last evening, sometime in the mid evening not exact sure when he noticed that when he picked his hand up it felt like it was not working right.  It seemed a little clumsy.  He went to bed thought he might need to have it checked out and when he got up this morning it persisted but seems to be getting a little bit better.  Just feels a little weak and as if there is a little bit of a weird sensation in the hand.  No trouble speaking.  No facial droop.  No headache.  Otherwise feels okay.  No trouble walking.  Denies history of stroke.  Does have a history of hypertension and thinks he took his medicine this morning   Past Medical History:  Diagnosis Date  . Diabetes mellitus without complication (HCC)   . Goiter   . History of inguinal hernia repair   . Hyperlipidemia   . Hypertension     Patient Active Problem List   Diagnosis Date Noted  . Intermittent low back pain 12/20/2016  . Hyperglycemia 06/01/2016  . Hypertension, benign 05/31/2016  . Dermatitis 05/31/2016  . 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 Abnormal TSH 06/04/2015    Past Surgical History:  Procedure Laterality Date  . COLONOSCOPY    . CYST EXCISION Left 2008   Upper Back on Left Shoulder  . HERNIA REPAIR     LIH   . INGUINAL HERNIA REPAIR  11/27/2011   Procedure: HERNIA REPAIR INGUINAL ADULT;  Surgeon: Jetty DuhamelJames O Wyatt III, MD;  Location: [facility];  Service: General;  Laterality: Right;  right inguinal hernia repair with mesh     Prior to Admission medications   Medication Sig Start Date End Date Taking? Authorizing Provider  allopurinol (ZYLOPRIM) 300 MG tablet TAKE 1 TABLET BY MOUTH EVERY DAY 07/30/17   Alba CorySowles, Krichna, MD  aspirin EC 81 MG tablet Take 1 tablet (81 mg total) by mouth daily. Patient not taking: Reported on 08/22/2017 12/20/16   Alba CorySowles, Krichna, MD  baclofen (LIORESAL) 10 MG tablet Take 1 tablet (10 mg total) by mouth 3 (three) times daily. Patient not taking: Reported on 03/22/2018 02/13/18   Cheryle HorsfallPoulose, Elizabeth E, NP  benazepril (LOTENSIN) 20 MG tablet TAKE 2 TABLETS BY MOUTH EVERY DAY 08/26/18   Poulose, Percell BeltElizabeth E, NP  triamcinolone (KENALOG) 0.025 % cream Apply 1 application topically 2 (two) times daily. 03/22/18   Poulose, Percell BeltElizabeth E, NP  triamterene-hydrochlorothiazide (MAXZIDE-25) 37.5-25 MG tablet Take 1 tablet by mouth daily. 04/18/18   Poulose, Percell BeltElizabeth E, NP    Allergies Norvasc [amlodipine]  Family History  Problem Relation Age of Onset  . Breast cancer Mother     Social History Social History   Tobacco Use  . Smoking status: Former Smoker    Types: Cigars    Last attempt to quit: 11/22/1988    Years since quitting: 29.9  . Smokeless tobacco: Never Used  . Tobacco comment: Smokes off and on   Substance Use Topics  . Alcohol use: No  Comment: he quit 10/2016  . Drug use: No    Review of Systems Constitutional: No fever/chills Eyes: No visual changes. ENT: No sore throat. Cardiovascular: Denies chest pain. Respiratory: Denies shortness of breath. Gastrointestinal: No abdominal pain.   Genitourinary: Negative for dysuria. Musculoskeletal: Negative for back pain.  See HPI Skin: Negative for rash. Neurological: Negative for headaches.  See HPI    ____________________________________________   PHYSICAL EXAM:  VITAL SIGNS: ED Triage Vitals [10/28/18 1331]  Enc Vitals Group     BP (!) 224/115     Pulse Rate 83     Resp 20     Temp 99 F (37.2 C)     Temp  Source Oral     SpO2 99 %     Weight 175 lb (79.4 kg)     Height 5\' 8"  (1.727 m)     Head Circumference      Peak Flow      Pain Score 0     Pain Loc      Pain Edu?      Excl. in GC?     Constitutional: Alert and oriented. Well appearing and in no acute distress. Eyes: Conjunctivae are normal. Head: Atraumatic. Nose: No congestion/rhinnorhea. Mouth/Throat: Mucous membranes are moist. Neck: No stridor.  Cardiovascular: Normal rate, regular rhythm. Grossly normal heart sounds.  Good peripheral circulation. Respiratory: Normal respiratory effort.  No retractions. Lungs CTAB. Gastrointestinal: Soft and nontender. No distention. Musculoskeletal: No lower extremity tenderness nor edema. Neurologic:    NIH score equals 1, performed by me at bedside.   The patient has no pronator drift. The patient has normal cranial nerve exam. Extraocular movements are normal. Visual fields are normal. Patient has 5 out of 5 strength in all extremities. There is no numbness or gross, acute sensory abnormality in the extremities bilaterally. No speech disturbance. No dysarthria. No aphasia. No ataxia except for some slight difficulty with use of the right hand which is very minimal. Normal finger nose finger bilat for some slight difficulty use the right hand. Patient speaking in full and clear sentences. Patient is very clear that the symptoms are actually improved quite a bit from where they were last night.  Skin:  Skin is warm, dry and intact. No rash noted. Psychiatric: Mood and affect are normal. Speech and behavior are normal.  ____________________________________________   LABS (all labs ordered are listed, but only abnormal results are displayed)  Labs Reviewed  CBC - Abnormal; Notable for the following components:      Result Value   RBC 6.08 (*)    HCT 53.2 (*)    RDW 15.7 (*)    All other components within normal limits  COMPREHENSIVE METABOLIC PANEL - Abnormal; Notable  for the following components:   Calcium 8.8 (*)    Total Protein 8.2 (*)    All other components within normal limits  DIFFERENTIAL  TROPONIN I  PROTIME-INR  APTT   ____________________________________________  EKG  ED ECG REPORT I, Sharyn Creamer, the attending physician, personally viewed and interpreted this ECG.  Date: 10/28/2018 EKG Time: 1330 Rate: 90 Rhythm: normal sinus rhythm QRS Axis: normal except for possible old anterior infarct Intervals: normal ST/T Wave abnormalities: normal Narrative Interpretation: no evidence of acute ischemia  ____________________________________________  RADIOLOGY  Ct Head Wo Contrast  Result Date: 10/28/2018 CLINICAL DATA:  Chronic headache EXAM: CT HEAD WITHOUT CONTRAST TECHNIQUE: Contiguous axial images were obtained from the base of the skull through the vertex without intravenous contrast.  COMPARISON:  None. FINDINGS: Brain: No evidence of acute infarction, hemorrhage, hydrocephalus, extra-axial collection or mass lesion/mass effect. Subcortical white matter and periventricular small vessel ischemic changes. Vascular: Mild intracranial atherosclerosis. Skull: Normal. Negative for fracture or focal lesion. Sinuses/Orbits: The visualized paranasal sinuses are essentially clear. The mastoid air cells are unopacified. Other: None. IMPRESSION: No evidence of acute intracranial abnormality. Mild small vessel ischemic changes. Electronically Signed   By: Charline BillsSriyesh  Krishnan M.D.   On: 10/28/2018 14:01    CT head reviewed negative for acute. ____________________________________________   PROCEDURES  Procedure(s) performed: None  Procedures  Critical Care performed: No   ____________________________________________   INITIAL IMPRESSION / ASSESSMENT AND PLAN / ED COURSE  Pertinent labs & imaging results that were available during my care of the patient were reviewed by me and considered in my medical decision making (see chart for  details).   Subacute onset of right upper extremity weakness with sensory change, now on examination just of very slight seemingly minimal ataxia.  His muscle strength is quite good now and he reports his symptoms are overall quite a bit improved.  Highly suspicious for possible stroke.  No evidence of acute pulmonary, cardiac or other etiologies to noted at this time.    ----------------------------------------- 5:12 PM on 10/28/2018 -----------------------------------------  Giving home blood pressure medicine, continue to monitor his blood pressure.  Denies headache and reports his neurologic symptoms are improving from where they were last night and this morning, patient is comfortable with plan for admission as I think this certainly warrants further work-up for concerns of a possible TIA or very small stroke.  Hypertensive emergency is also considered, but lack of headache, visual symptoms, chest pain and I suspect his blood pressure may be reactive to a possible small stroke or TIA.  Do not wish to aggressively lower his blood pressure at this time, will continue to monitor.  Discussed case with Dr. Nancy MarusMayo admitting.  ____________________________________________   FINAL CLINICAL IMPRESSION(S) / ED DIAGNOSES  Final diagnoses:  Cerebrovascular accident (CVA), unspecified mechanism (HCC)  Hypertension, unspecified type        Note:  This document was prepared using Dragon voice recognition software and may include unintentional dictation errors       Sharyn CreamerQuale, Oluwateniola Leitch, MD 10/28/18 1715

## 2018-10-28 NOTE — ED Notes (Signed)
Pt cleared to eat and drink by ED MD, pt given sandwich tray by RN.

## 2018-10-28 NOTE — Progress Notes (Signed)
Family Meeting Note  Advance Directive:yes  Today a meeting took place with the Patient.  Patient is able to participate.  The following clinical team members were present during this meeting:MD  The following were discussed:Patient's diagnosis: , Patient's progosis: Unable to determine and Goals for treatment: Full Code   Patient states he is overall very healthy and would like to live as long as possible.  We discussed CODE STATUS and patient would like to be full code at this time.  Family in agreement.  Additional follow-up to be provided: prn  Time spent during discussion:20 minutes  Hilton SinclairKaty D Mayo, MD

## 2018-10-29 ENCOUNTER — Observation Stay
Admit: 2018-10-29 | Discharge: 2018-10-29 | Disposition: A | Discharge status: Home / Self Care | Payer: Medicare Other | Attending: Internal Medicine | Admitting: Internal Medicine

## 2018-10-29 ENCOUNTER — Observation Stay: Payer: Medicare Other

## 2018-10-29 ENCOUNTER — Other Ambulatory Visit: Payer: Self-pay

## 2018-10-29 DIAGNOSIS — I639 Cerebral infarction, unspecified: Secondary | ICD-10-CM | POA: Diagnosis not present

## 2018-10-29 DIAGNOSIS — I6523 Occlusion and stenosis of bilateral carotid arteries: Secondary | ICD-10-CM | POA: Diagnosis not present

## 2018-10-29 DIAGNOSIS — R7989 Other specified abnormal findings of blood chemistry: Secondary | ICD-10-CM | POA: Diagnosis not present

## 2018-10-29 DIAGNOSIS — I6389 Other cerebral infarction: Secondary | ICD-10-CM | POA: Diagnosis not present

## 2018-10-29 DIAGNOSIS — E119 Type 2 diabetes mellitus without complications: Secondary | ICD-10-CM | POA: Diagnosis not present

## 2018-10-29 DIAGNOSIS — R29898 Other symptoms and signs involving the musculoskeletal system: Secondary | ICD-10-CM | POA: Diagnosis not present

## 2018-10-29 DIAGNOSIS — I161 Hypertensive emergency: Secondary | ICD-10-CM | POA: Diagnosis not present

## 2018-10-29 DIAGNOSIS — R51 Headache: Secondary | ICD-10-CM | POA: Diagnosis not present

## 2018-10-29 LAB — ECHOCARDIOGRAM COMPLETE
Height: 68 in
Weight: 2800 oz

## 2018-10-29 LAB — LIPID PANEL
Cholesterol: 160 mg/dL (ref 0–200)
HDL: 61 mg/dL (ref >40)
LDL Cholesterol: 80 mg/dL (ref 0–99)
TRIGLYCERIDES: 94 mg/dL (ref <150)
Total CHOL/HDL Ratio: 2.6 RATIO
VLDL: 19 mg/dL (ref 0–40)

## 2018-10-29 LAB — TSH: TSH: 27.116 u[IU]/mL — ABNORMAL HIGH (ref 0.350–4.500)

## 2018-10-29 MED ORDER — ATORVASTATIN CALCIUM 40 MG PO TABS: 40.0000 mg | ORAL_TABLET | Freq: Every day | ORAL | 0 refills | Status: DC

## 2018-10-29 NOTE — Care Management Obs Status (Signed)
MEDICARE OBSERVATION STATUS NOTIFICATION   Patient Details  Name: Dennis Ford MRN: [id number] Date of Birth: 02/03/1941   Medicare Observation Status Notification Given:  Yes    Gwenette GreetBrenda S Hawraa Stambaugh, RN 10/29/2018, 1:52 PM

## 2018-10-29 NOTE — Progress Notes (Signed)
*  PRELIMINARY RESULTS* Echocardiogram 2D Echocardiogram has been performed.  Dennis Ford, Dennis Ford 10/29/2018, 9:57 AM

## 2018-10-29 NOTE — Evaluation (Signed)
Physical Therapy Evaluation Patient Details Name: Leda MinRobert Petrasek MRN: [id number] DOB: [birth year] Today's Date: 10/29/2018   History of Present Illness  presented to ER secondary to acute onset of R UE weakness, elevated BP; admitted for TIA/CVA work up.  Head CT/MRI negative for acute injury.  Patient reporting symptoms fully resolved at this time.  Clinical Impression  Upon evaluation, patient alert and oriented; follows all commands and demonstrates good effort, safety awareness and insight.  Bilat UE/LE strength and ROM grossly symmetrical without focal weakness, sensory or coordination deficits appreciated.  Patient reports symptoms have fully resolved he feels back to baseline.  Able to complete bed mobility, sit/stand, basic transfers, gait (200') and stairs (up/down 6) with mod indep/indep.  Good control, stability and overall confidence with all mobility tasks. Patient appears at baseline level of functional ability without acute PT needs identified at this time.  Will complete initial orders. Please reconsult should needs change.    Follow Up Recommendations No PT follow up    Equipment Recommendations       Recommendations for Other Services       Precautions / Restrictions Precautions Precautions: None      Mobility  Bed Mobility Overal bed mobility: Independent                Transfers Overall transfer level: Independent                  Ambulation/Gait Ambulation/Gait assistance: Independent Gait Distance (Feet): 200 Feet Assistive device: None       General Gait Details: reciprocal stepping pattern with good cadence, gait mechanics and overall gait control.  Completes dynamic gait components easily and without disruption to gait performance.  Stairs Stairs: Yes Stairs assistance: Modified independent (Device/Increase time) Stair Management: No rails Number of Stairs: 6 General stair comments: reciprocal stepping; no buckling or LOB; good  control and overall confidence  Wheelchair Mobility    Modified Rankin (Stroke Patients Only)       Balance Overall balance assessment: Independent                                           Pertinent Vitals/Pain Pain Assessment: No/denies pain    Home Living Family/patient expects to be discharged to:: Private residence Living Arrangements: Spouse/significant other Available Help at Discharge: Family Type of Home: House Home Access: Stairs to enter Entrance Stairs-Rails: None Entrance Stairs-Number of Steps: 4 Home Layout: One level Home Equipment: None      Prior Function Level of Independence: Independent         Comments: Indep with ADLs, household and community mobility without assist device; enjoys working in his shop     Hand Dominance   Dominant Hand: Right    Extremity/Trunk Assessment   Upper Extremity Assessment Upper Extremity Assessment: Overall WFL for tasks assessed(grossly at least 4+/5 throughout, no focal weakness, sensory or coordination deficits noted)    Lower Extremity Assessment Lower Extremity Assessment: Overall WFL for tasks assessed(grossly at least 4+/5 throughout, no focal weakness, sensory or coordination deficits noted)       Communication   Communication: No difficulties  Cognition Arousal/Alertness: Awake/alert Behavior During Therapy: WFL for tasks assessed/performed Overall Cognitive Status: Within Functional Limits for tasks assessed  General Comments      Exercises     Assessment/Plan    PT Assessment Patent does not need any further PT services  PT Problem List         PT Treatment Interventions      PT Goals (Current goals can be found in the Care Plan section)  Acute Rehab PT Goals Patient Stated Goal: to return home PT Goal Formulation: All assessment and education complete, DC therapy Time For Goal Achievement:  10/29/18 Potential to Achieve Goals: Good    Frequency     Barriers to discharge        Co-evaluation               AM-PAC PT "6 Clicks" Mobility  Outcome Measure Help needed turning from your back to your side while in a flat bed without using bedrails?: None Help needed moving from lying on your back to sitting on the side of a flat bed without using bedrails?: None Help needed moving to and from a bed to a chair (including a wheelchair)?: None Help needed standing up from a chair using your arms (e.g., wheelchair or bedside chair)?: None Help needed to walk in hospital room?: None Help needed climbing 3-5 steps with a railing? : None 6 Click Score: 24    End of Session   Activity Tolerance: Patient tolerated treatment well Patient left: in bed;with call bell/phone within reach   PT Visit Diagnosis: Difficulty in walking, not elsewhere classified (R26.2)    Time: [id number] PT Time Calculation (min) (ACUTE ONLY): 9 min   Charges:   PT Evaluation $PT Eval Low Complexity: 1 Low          Lezli Danek H. Manson PasseyBrown, PT, DPT, NCS 10/29/18, 11:10 AM [phone]

## 2018-10-29 NOTE — Plan of Care (Signed)
  Problem: Education: Goal: Knowledge of General Education information will improve Description Including pain rating scale, medication(s)/side effects and non-pharmacologic comfort measures Outcome: Progressing   Problem: Health Behavior/Discharge Planning: Goal: Ability to manage health-related needs will improve Outcome: Progressing   Problem: Elimination: Goal: Will not experience complications related to urinary retention Outcome: Progressing   Problem: Safety: Goal: Ability to remain free from injury will improve Outcome: Progressing   Problem: Skin Integrity: Goal: Risk for impaired skin integrity will decrease Outcome: Progressing   Problem: Education: Goal: Knowledge of disease or condition will improve Outcome: Progressing Goal: Knowledge of patient specific risk factors addressed and post discharge goals established will improve Outcome: Progressing   Problem: Self-Care: Goal: Ability to participate in self-care as condition permits will improve Outcome: Progressing   Problem: Ischemic Stroke/TIA Tissue Perfusion: Goal: Complications of ischemic stroke/TIA will be minimized Outcome: Progressing

## 2018-10-29 NOTE — Progress Notes (Signed)
Patient returned from MRI.

## 2018-10-29 NOTE — Progress Notes (Signed)
OT Screen Note  Patient Details Name: Dennis Ford MRN: [id number] DOB: 03-15-1941   Cancelled Treatment:    Reason Eval/Treat Not Completed: OT screened, no needs identified, will sign off. Order received, chart reviewed. Pt back to baseline functional independence. No neuro deficits appreciated. Independent with ADL and mobility. No skilled OT needs identified. Will sign off. Please re-consult if additional needs arise.   Richrd PrimeJamie Stiller, MPH, MS, OTR/L ascom [phone] 10/29/18, 8:01 AM

## 2018-10-29 NOTE — Consult Note (Signed)
Referring Physician: Altamese DillingVachhani, Vaibhavkumar, MD     Chief Complaint: Right right arm weakness, right hand numbness and tingling sensation  HPI: Dennis Ford is an 77 y.o. male with past medical history of diabetes mellitus, goiter, inguinal hernia repair, hyperlipidemia, and hypertension presenting to the ED on 10/28/2018 with chief complaints of weakness in right arm and tingling sensation in hands. Patient states that he was watching TV the evening of 10/27/2018 when he fell asleep. He thinks he might have bent his elbow or slept the wrong way, however when he woke up he could not feel his arm. It is as if his he has lost function in his arm associated with numbness and tingling in his hands.  He tried to shake it off but he did not get the sensation back so he went to sleep thinking it might get better in the morning.  When he woke up he still had a weird sensation in his head and therefore thought he might of had a stroke so decided to come to the ED for further evaluation. Denies associated symptoms of altered sensorium, speech abnormality, cranial nerve deficit, diplopia, nausea or vomiting,  dizziness or headaches.  On arrival to the ED he was noted to have elevated blood pressure in the 200s systolic.  Initial NIH stroke scale 0.  Patient had a CT head which did not show acute intracranial abnormality.  Follow-up MRI and MRA brain was also negative for acute intracranial abnormality or large vessel occlusion.  Patient was therefore admitted for further stroke work-up and management.  Date last known well: Date: 10/27/2018 Time last known well: Unable to determine tPA Given: No: outside window period  Past Medical History:  Diagnosis Date  . Diabetes mellitus without complication (HCC)   . Goiter   . History of inguinal hernia repair   . Hyperlipidemia   . Hypertension     Past Surgical History:  Procedure Laterality Date  . COLONOSCOPY    . CYST EXCISION Left 2008   Upper Back on Left  Shoulder  . HERNIA REPAIR     [facility]   . INGUINAL HERNIA REPAIR  11/27/2011   Procedure: HERNIA REPAIR INGUINAL ADULT;  Surgeon: Jetty DuhamelJames O Wyatt III, MD;  Location: [facility];  Service: General;  Laterality: Right;  right inguinal hernia repair with mesh    Family History  Problem Relation Age of Onset  . Breast cancer Mother    Social History:  reports that he quit smoking about 29 years ago. His smoking use included cigars. He has never used smokeless tobacco. He reports that he does not drink alcohol or use drugs.  Allergies:  Allergies  Allergen Reactions  . Norvasc [Amlodipine] Other (See Comments)    angioedema    Medications:  I have reviewed the patient's current medications. Prior to Admission:  Medications Prior to Admission  Medication Sig Dispense Refill Last Dose  . allopurinol (ZYLOPRIM) 300 MG tablet TAKE 1 TABLET BY MOUTH EVERY DAY (Patient taking differently: Take 300 mg by mouth daily as needed (for gout flare-ups). ) 90 tablet 1 Past Month at Unknown time  . aspirin EC 81 MG tablet Take 1 tablet (81 mg total) by mouth daily. 30 tablet 0 10/27/2018 at am  . benazepril (LOTENSIN) 20 MG tablet TAKE 2 TABLETS BY MOUTH EVERY DAY (Patient taking differently: Take 20 mg by mouth daily. ) 60 tablet 0 10/28/2018 at am  . triamcinolone (KENALOG) 0.025 % cream Apply 1 application topically 2 (  two) times daily. (Patient taking differently: Apply 1 application topically 2 (two) times daily as needed (for inflammation/itching). ) 30 g 1 Past Month at Unknown time  . baclofen (LIORESAL) 10 MG tablet Take 1 tablet (10 mg total) by mouth 3 (three) times daily. (Patient not taking: Reported on 10/28/2018) 30 each 0 Not Taking at Unknown time  . triamterene-hydrochlorothiazide (MAXZIDE-25) 37.5-25 MG tablet Take 1 tablet by mouth daily. (Patient not taking: Reported on 10/28/2018) 60 tablet 0 Not Taking at Unknown time   Scheduled: . aspirin EC  81 mg Oral Daily  .  atorvastatin  40 mg Oral q1800  . benazepril  20 mg Oral Daily  . enoxaparin (LOVENOX) injection  40 mg Subcutaneous Q24H    ROS: History obtained from the patient   General ROS: negative for - chills, fatigue, fever, night sweats, weight gain or weight loss Psychological ROS: negative for - behavioral disorder, hallucinations, memory difficulties, mood swings or suicidal ideation Ophthalmic ROS: negative for - blurry vision, double vision, eye pain or loss of vision ENT ROS: negative for - epistaxis, nasal discharge, oral lesions, sore throat, tinnitus or vertigo Allergy and Immunology ROS: negative for - hives or itchy/watery eyes Hematological and Lymphatic ROS: negative for - bleeding problems, bruising or swollen lymph nodes Endocrine ROS: negative for - galactorrhea, hair pattern changes, polydipsia/polyuria or temperature intolerance Respiratory ROS: negative for - cough, hemoptysis, shortness of breath or wheezing Cardiovascular ROS: negative for - chest pain, dyspnea on exertion, edema or irregular heartbeat Gastrointestinal ROS: negative for - abdominal pain, diarrhea, hematemesis, nausea/vomiting or stool incontinence Genito-Urinary ROS: negative for - dysuria, hematuria, incontinence or urinary frequency/urgency Musculoskeletal ROS: negative for - joint swelling or muscular weakness Neurological ROS: as noted in HPI Dermatological ROS: negative for rash and skin lesion changes  Physical Examination: Blood pressure (!) 172/94, pulse 79, temperature 98 F (36.7 C), temperature source Oral, resp. rate 16, height 5\' 8"  (1.727 m), weight 79.4 kg, SpO2 100 %.   HEENT-  Normocephalic, no lesions, without obvious abnormality.  Normal external eye and conjunctiva.  Normal TM's bilaterally.  Normal auditory canals and external ears. Normal external nose, mucus membranes and septum.  Normal pharynx. Cardiovascular- S1, S2 normal, pulses palpable throughout   Lungs- chest clear, no  wheezing, rales, normal symmetric air entry Abdomen- soft, non-tender; bowel sounds normal; no masses,  no organomegaly Extremities- no edema Lymph-no adenopathy palpable Musculoskeletal-no joint tenderness, deformity or swelling Skin-warm and dry, no hyperpigmentation, vitiligo, or suspicious lesions  Neurological Exam   Mental Status: Alert, oriented, thought content appropriate.  Speech fluent without evidence of aphasia.  Able to follow 3 step commands without difficulty. Attention span and concentration seemed appropriate  Cranial Nerves: II: Discs flat bilaterally; Visual fields grossly normal, pupils equal, round, reactive to light and accommodation III,IV, VI: ptosis not present, extra-ocular motions intact bilaterally V,VII: smile symmetric, facial light touch sensation intact VIII: hearing normal bilaterally IX,X: gag reflex present XI: bilateral shoulder shrug XII: midline tongue extension Motor: Right :  Upper extremity   5/5 Without pronator drift      Left: Upper extremity   5/5 without pronator drift Right:   Lower extremity   5/5                                          Left: Lower extremity   5/5 Tone and bulk:normal tone  throughout; no atrophy noted Sensory: Pinprick and light touch intact bilaterally Deep Tendon Reflexes: 2+ and symmetric throughout Plantars: Right: downgoing                            Left: downgoing Cerebellar: Finger-to-nose testing intact bilaterally. Heel to shin testing normal bilaterally Gait: not tested due to safety concerns  Data Reviewed  Laboratory Studies:  Basic Metabolic Panel: Recent Labs  Lab 10/28/18 1337  NA 138  K 4.0  CL 105  CO2 26  GLUCOSE 98  BUN 11  CREATININE 1.03  CALCIUM 8.8*    Liver Function Tests: Recent Labs  Lab 10/28/18 1337  AST 20  ALT 12  ALKPHOS 48  BILITOT 0.7  PROT 8.2*  ALBUMIN 4.2   No results for input(s): LIPASE, AMYLASE in the last 168 hours. No results for input(s): AMMONIA in  the last 168 hours.  CBC: Recent Labs  Lab 10/28/18 1337  WBC 4.8  NEUTROABS 2.3  HGB 16.5  HCT 53.2*  MCV 87.5  PLT 281    Cardiac Enzymes: Recent Labs  Lab 10/28/18 1337  TROPONINI <0.03    BNP: Invalid input(s): POCBNP  CBG: No results for input(s): GLUCAP in the last 168 hours.  Microbiology: Results for orders placed or performed in visit on 03/19/17  Wound culture     Status: None   Collection Time: 03/19/17  3:25 PM  Result Value Ref Range Status   Culture STAPHYLOCOCCUS AUREUS  Final    Comment: SOURCE->PARONYCHIA; SOURCE: WOUND&WOUND   Gram Stain No WBC Seen  Final   Gram Stain No Squamous Epithelial Cells Seen  Final   Gram Stain Few Gram Positive Cocci In Pairs  Final    Comment: Rifampin and Gentamicin should not be used as single drugs for treatment of Staph infections.    Colony Count Abundant  Final   Organism ID, Bacteria STAPHYLOCOCCUS AUREUS  Final      Susceptibility   Staphylococcus aureus -  (no method available)    OXACILLIN <=0.25 Sensitive     CEFAZOLIN  Sensitive     GENTAMICIN <=0.5 Sensitive     CIPROFLOXACIN <=0.5 Sensitive     LEVOFLOXACIN <=0.12 Sensitive     MOXIFLOXACIN <=0.25 Sensitive     TRIMETH/SULFA <=10 Sensitive     VANCOMYCIN 1 Sensitive     CLINDAMYCIN <=0.25 Sensitive     ERYTHROMYCIN <=0.25 Sensitive     LINEZOLID 1 Sensitive     QUINUPRISTIN/DALF <=0.25 Sensitive     RIFAMPIN <=0.5 Sensitive     TETRACYCLINE <=1 Sensitive     Coagulation Studies: Recent Labs    10/28/18 1445  LABPROT 12.9  INR 0.98    Urinalysis: No results for input(s): COLORURINE, LABSPEC, PHURINE, GLUCOSEU, HGBUR, BILIRUBINUR, KETONESUR, PROTEINUR, UROBILINOGEN, NITRITE, LEUKOCYTESUR in the last 168 hours.  Invalid input(s): APPERANCEUR  Lipid Panel:    Component Value Date/Time   CHOL 160 10/29/2018 0457   TRIG 94 10/29/2018 0457   HDL 61 10/29/2018 0457   CHOLHDL 2.6 10/29/2018 0457   VLDL 19 10/29/2018 0457   LDLCALC 80  10/29/2018 0457   LDLCALC 101 (H) 03/22/2018 1124    HgbA1C:  Lab Results  Component Value Date   HGBA1C 5.4 08/01/2017    Urine Drug Screen:  No results found for: LABOPIA, COCAINSCRNUR, LABBENZ, AMPHETMU, THCU, LABBARB  Alcohol Level: No results for input(s): ETH in the last 168 hours.  Other results: EKG:  normal EKG, normal sinus rhythm, unchanged from previous tracings.  Imaging: Ct Head Wo Contrast  Result Date: 10/28/2018 CLINICAL DATA:  Chronic headache EXAM: CT HEAD WITHOUT CONTRAST TECHNIQUE: Contiguous axial images were obtained from the base of the skull through the vertex without intravenous contrast. COMPARISON:  None. FINDINGS: Brain: No evidence of acute infarction, hemorrhage, hydrocephalus, extra-axial collection or mass lesion/mass effect. Subcortical white matter and periventricular small vessel ischemic changes. Vascular: Mild intracranial atherosclerosis. Skull: Normal. Negative for fracture or focal lesion. Sinuses/Orbits: The visualized paranasal sinuses are essentially clear. The mastoid air cells are unopacified. Other: None. IMPRESSION: No evidence of acute intracranial abnormality. Mild small vessel ischemic changes. Electronically Signed   By: Charline Bills M.D.   On: 10/28/2018 14:01   Mr Brain Wo Contrast  Result Date: 10/29/2018 CLINICAL DATA:  Weakness. Chronic headache. History of hypertension, hyperlipidemia and diabetes. EXAM: MRI HEAD WITHOUT CONTRAST MRA HEAD WITHOUT CONTRAST TECHNIQUE: Multiplanar, multiecho pulse sequences of the brain and surrounding structures were obtained without intravenous contrast. Angiographic images of the head were obtained using MRA technique without contrast. COMPARISON:  None. FINDINGS: MRI HEAD FINDINGS INTRACRANIAL CONTENTS: No reduced diffusion to suggest acute ischemia. No susceptibility artifact to suggest hemorrhage. The ventricles and sulci are normal for patient's age. Patchy supratentorial white matter FLAIR T2  hyperintensities compatible with mild-to-moderate chronic small vessel ischemic changes, normal for age. No suspicious parenchymal signal, masses, mass effect. No abnormal extra-axial fluid collections. No extra-axial masses. VASCULAR: Normal major intracranial vascular flow voids present at skull base. SKULL AND UPPER CERVICAL SPINE: No abnormal sellar expansion. No suspicious calvarial bone marrow signal. Craniocervical junction maintained. SINUSES/ORBITS: The mastoid air-cells and included paranasal sinuses are well-aerated.The included ocular globes and orbital contents are non-suspicious. OTHER: Patient is edentulous. MRA HEAD FINDINGS ANTERIOR CIRCULATION: Normal flow related enhancement of the included cervical, petrous, cavernous and supraclinoid internal carotid arteries. Patent anterior communicating artery. Patent anterior and middle cerebral arteries. No large vessel occlusion, flow limiting stenosis, aneurysm. POSTERIOR CIRCULATION: Codominant vertebral arteries. Vertebrobasilar arteries are patent, with normal flow related enhancement of the main branch vessels. Patent posterior cerebral arteries. No large vessel occlusion, flow limiting stenosis,  aneurysm. ANATOMIC VARIANTS: None. Source images and MIP images were reviewed. IMPRESSION: 1. Negative noncontrast MRI head for age. 2. Negative noncontrast MRA head. Electronically Signed   By: Awilda Metro M.D.   On: 10/29/2018 04:38   US Carotid Bilateral (at [facility] And [facility] Only)  Result Date: 10/29/2018 CLINICAL DATA:  Right upper extremity weakness, hypertension, hyperlipidemia EXAM: BILATERAL CAROTID DUPLEX ULTRASOUND TECHNIQUE: Wallace Cullens scale imaging, color Doppler and duplex ultrasound were performed of bilateral carotid and vertebral arteries in the neck. COMPARISON:  10/29/2018 MRI FINDINGS: Criteria: Quantification of carotid stenosis is based on velocity parameters that correlate the residual internal carotid diameter with NASCET-based  stenosis levels, using the diameter of the distal internal carotid lumen as the denominator for stenosis measurement. The following velocity measurements were obtained: RIGHT ICA: 53/9 cm/sec CCA: 89/13 cm/sec SYSTOLIC ICA/CCA RATIO:  0.6 ECA: 83 cm/sec LEFT ICA: 88/19 cm/sec CCA: 87/17 cm/sec SYSTOLIC ICA/CCA RATIO:  1.0 ECA: 101 cm/sec RIGHT CAROTID ARTERY: Minor intimal thickening and early atherosclerotic plaque formation. No hemodynamically significant right ICA stenosis, velocity elevation, or turbulent flow. Degree of narrowing less than 50%. RIGHT VERTEBRAL ARTERY:  Antegrade LEFT CAROTID ARTERY: Similar scattered minor intimal thickening and early atherosclerotic plaque formation. No hemodynamically significant left ICA stenosis, velocity elevation, or turbulent flow. LEFT VERTEBRAL ARTERY:  Antegrade IMPRESSION: Minor  carotid intimal thickening and atherosclerosis. No hemodynamically significant ICA stenosis. Degree of narrowing less than 50% bilaterally by ultrasound criteria. Patent antegrade vertebral flow bilaterally Electronically Signed   By: Judie Petit.  Shick M.D.   On: 10/29/2018 09:29   Mr Maxine Glenn Head/brain ZO Cm  Result Date: 10/29/2018 CLINICAL DATA:  Weakness. Chronic headache. History of hypertension, hyperlipidemia and diabetes. EXAM: MRI HEAD WITHOUT CONTRAST MRA HEAD WITHOUT CONTRAST TECHNIQUE: Multiplanar, multiecho pulse sequences of the brain and surrounding structures were obtained without intravenous contrast. Angiographic images of the head were obtained using MRA technique without contrast. COMPARISON:  None. FINDINGS: MRI HEAD FINDINGS INTRACRANIAL CONTENTS: No reduced diffusion to suggest acute ischemia. No susceptibility artifact to suggest hemorrhage. The ventricles and sulci are normal for patient's age. Patchy supratentorial white matter FLAIR T2 hyperintensities compatible with mild-to-moderate chronic small vessel ischemic changes, normal for age. No suspicious parenchymal signal,  masses, mass effect. No abnormal extra-axial fluid collections. No extra-axial masses. VASCULAR: Normal major intracranial vascular flow voids present at skull base. SKULL AND UPPER CERVICAL SPINE: No abnormal sellar expansion. No suspicious calvarial bone marrow signal. Craniocervical junction maintained. SINUSES/ORBITS: The mastoid air-cells and included paranasal sinuses are well-aerated.The included ocular globes and orbital contents are non-suspicious. OTHER: Patient is edentulous. MRA HEAD FINDINGS ANTERIOR CIRCULATION: Normal flow related enhancement of the included cervical, petrous, cavernous and supraclinoid internal carotid arteries. Patent anterior communicating artery. Patent anterior and middle cerebral arteries. No large vessel occlusion, flow limiting stenosis, aneurysm. POSTERIOR CIRCULATION: Codominant vertebral arteries. Vertebrobasilar arteries are patent, with normal flow related enhancement of the main branch vessels. Patent posterior cerebral arteries. No large vessel occlusion, flow limiting stenosis,  aneurysm. ANATOMIC VARIANTS: None. Source images and MIP images were reviewed. IMPRESSION: 1. Negative noncontrast MRI head for age. 2. Negative noncontrast MRA head. Electronically Signed   By: Awilda Metro M.D.   On: 10/29/2018 04:38   Assessment: 77 y.o. male with past medical history of diabetes mellitus, goiter, inguinal hernia repair, hyperlipidemia, and hypertension presenting to the ED on 10/28/2018 with chief complaints of weakness in right arm and tingling sensation in hands.  Initial concerns for ischemic event. Symptoms now improved cannot also rule out temporary ulnar nerve or radial nerve palsy. CT head reviewed and showed no acute intracranial abnormality.  Follow-up MRI and MRA of head also reviewed and showed no acute intracranial abnormality or large vessel occlusion.  Ultrasound carotid did not show hemodynamically significant ICA stenosis.  Echocardiogram did not show  cardiac source of emboli.  LDL 80.  Hemoglobin A1c pending.  Patient report he was on aspirin 81 mg/day prior to this event.    Stroke Risk Factors - diabetes mellitus and hypertension  Plan: 1. HgbA1c pending 2. Continue medical management with dual therapy Aspirin 81 mg/day  with intensive management of vascular risk factor to keep systolic BP (SBP) <140 mm Hg (109 mm Hg if diabetic) 3. Statin with goal low density lipoprotein (LDL) <70 mg/dl, and lifestyle modification. Continue high potency statin. 4.  Recommend follow-up with outpatient neurology  This patient was staffed with Dr. Loretha Brasil, Doyle Askew who personally evaluated patient, reviewed documentation and agreed with assessment and plan of care as above.  Webb Silversmith, DNP, FNP-BC Board certified Nurse Practitioner Neurology Department  10/29/2018, 1:00 PM

## 2018-10-30 LAB — HEMOGLOBIN A1C
Hgb A1c MFr Bld: 5.4 % (ref 4.8–5.6)
Mean Plasma Glucose: 108 mg/dL

## 2018-10-31 ENCOUNTER — Telehealth: Payer: Self-pay

## 2018-10-31 NOTE — Telephone Encounter (Signed)
Can you let me know where to schedule him??

## 2018-10-31 NOTE — Telephone Encounter (Signed)
Copied from CRM [id number]. Topic: General - Other >> Oct 29, 2018  1:58 PM Elliot GaultBell, Jamael Hoffmann M wrote: Jethro Bolusaller name: Jacinto HalimVanetta  Relation to EA:VWUJpt:[facility] / Nurse secretary   Call back number: patient mobile # [phone]    [facility] due to patient being discharged today 10/29/18 for right arm weakness, please advise due to PCP being out of the office

## 2018-11-01 ENCOUNTER — Observation Stay
Admission: EM | Admit: 2018-11-01 | Discharge: 2018-11-02 | Disposition: A | Discharge status: Home / Self Care | Payer: Medicare Other | Attending: Internal Medicine | Admitting: Internal Medicine

## 2018-11-01 ENCOUNTER — Other Ambulatory Visit: Payer: Self-pay

## 2018-11-01 DIAGNOSIS — Z79899 Other long term (current) drug therapy: Secondary | ICD-10-CM | POA: Diagnosis not present

## 2018-11-01 DIAGNOSIS — Z888 Allergy status to other drugs, medicaments and biological substances status: Secondary | ICD-10-CM | POA: Insufficient documentation

## 2018-11-01 DIAGNOSIS — K449 Diaphragmatic hernia without obstruction or gangrene: Secondary | ICD-10-CM | POA: Diagnosis not present

## 2018-11-01 DIAGNOSIS — K921 Melena: Secondary | ICD-10-CM

## 2018-11-01 DIAGNOSIS — F17290 Nicotine dependence, other tobacco product, uncomplicated: Secondary | ICD-10-CM | POA: Insufficient documentation

## 2018-11-01 DIAGNOSIS — E785 Hyperlipidemia, unspecified: Secondary | ICD-10-CM | POA: Diagnosis not present

## 2018-11-01 DIAGNOSIS — E119 Type 2 diabetes mellitus without complications: Secondary | ICD-10-CM

## 2018-11-01 DIAGNOSIS — I10 Essential (primary) hypertension: Secondary | ICD-10-CM | POA: Diagnosis present

## 2018-11-01 DIAGNOSIS — R61 Generalized hyperhidrosis: Secondary | ICD-10-CM | POA: Diagnosis not present

## 2018-11-01 DIAGNOSIS — R42 Dizziness and giddiness: Secondary | ICD-10-CM | POA: Insufficient documentation

## 2018-11-01 DIAGNOSIS — K922 Gastrointestinal hemorrhage, unspecified: Secondary | ICD-10-CM | POA: Diagnosis present

## 2018-11-01 DIAGNOSIS — E7800 Pure hypercholesterolemia, unspecified: Secondary | ICD-10-CM | POA: Diagnosis present

## 2018-11-01 DIAGNOSIS — K317 Polyp of stomach and duodenum: Secondary | ICD-10-CM | POA: Diagnosis not present

## 2018-11-01 DIAGNOSIS — Z7982 Long term (current) use of aspirin: Secondary | ICD-10-CM | POA: Insufficient documentation

## 2018-11-01 LAB — BASIC METABOLIC PANEL
Anion gap: 6 (ref 5–15)
BUN: 15 mg/dL (ref 8–23)
CHLORIDE: 106 mmol/L (ref 98–111)
CO2: 23 mmol/L (ref 22–32)
CREATININE: 1.09 mg/dL (ref 0.61–1.24)
Calcium: 8.6 mg/dL — ABNORMAL LOW (ref 8.9–10.3)
GFR calc Af Amer: >60 mL/min (ref >60)
GFR calc non Af Amer: >60 mL/min (ref >60)
Glucose, Bld: 125 mg/dL — ABNORMAL HIGH (ref 70–99)
Potassium: 4 mmol/L (ref 3.5–5.1)
Sodium: 135 mmol/L (ref 135–145)

## 2018-11-01 LAB — CBC WITH DIFFERENTIAL/PLATELET
Abs Immature Granulocytes: 0.02 10*3/uL (ref 0.00–0.07)
Basophils Absolute: 0.1 10*3/uL (ref 0.0–0.1)
Basophils Relative: 1 %
Eosinophils Absolute: 0.1 10*3/uL (ref 0.0–0.5)
Eosinophils Relative: 3 %
HCT: 45 % (ref 39.0–52.0)
Hemoglobin: 14.2 g/dL (ref 13.0–17.0)
Immature Granulocytes: 0 %
Lymphocytes Relative: 42 %
Lymphs Abs: 2.1 10*3/uL (ref 0.7–4.0)
MCH: 27.5 pg (ref 26.0–34.0)
MCHC: 31.6 g/dL (ref 30.0–36.0)
MCV: 87 fL (ref 80.0–100.0)
Monocytes Absolute: 0.6 10*3/uL (ref 0.1–1.0)
Monocytes Relative: 12 %
NRBC: 0 % (ref 0.0–0.2)
Neutro Abs: 2.1 10*3/uL (ref 1.7–7.7)
Neutrophils Relative %: 42 %
Platelets: 280 10*3/uL (ref 150–400)
RBC: 5.17 MIL/uL (ref 4.22–5.81)
RDW: 15.2 % (ref 11.5–15.5)
WBC: 5.1 10*3/uL (ref 4.0–10.5)

## 2018-11-01 LAB — PROTIME-INR
INR: 0.97
Prothrombin Time: 12.8 seconds (ref 11.4–15.2)

## 2018-11-01 LAB — TYPE AND SCREEN
ABO/RH(D): O POS
Antibody Screen: NEGATIVE

## 2018-11-01 NOTE — ED Triage Notes (Signed)
Pt states 30 min pta he started having bloody stools states x 3 episodes. States it Is a large amt, hx of the same years ago and was admitted. Pt states unsure of cause when he was admitted.

## 2018-11-01 NOTE — ED Provider Notes (Signed)
[facility] Emergency Department Provider Note ____________________________________________   First MD Initiated Contact with Patient 11/01/18 2101     (approximate)  I have reviewed the triage vital signs and the nursing notes.   HISTORY  Chief Complaint Rectal Bleeding    HPI Dennis Ford is a 77 y.o. male with PMH as noted below as well as apparent prior history of GI bleed which he says was many years ago and presented similar to his symptoms today.  He presents today with blood in the stool, described as mostly bright red blood although at first it was a bit darker, but not described as black or tarry.  He had some mild crampy lower abdominal pain which has resolved.  He denies any nausea or vomiting.  The patient states at one point while in the ED waiting, he felt lightheaded and diaphoretic although this has resolved as well.  Past Medical History:  Diagnosis Date  . Diabetes mellitus without complication (HCC)   . Goiter   . History of inguinal hernia repair   . Hyperlipidemia   . Hypertension     Patient Active Problem List   Diagnosis Date Noted  . Right hand weakness 10/28/2018  . Intermittent low back pain 12/20/2016  . Hyperglycemia 06/01/2016  . Hypertension, benign 05/31/2016  . Dermatitis 05/31/2016  . 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 Abnormal TSH 06/04/2015    Past Surgical History:  Procedure Laterality Date  . COLONOSCOPY    . CYST EXCISION Left 2008   Upper Back on Left Shoulder  . HERNIA REPAIR     [facility]   . INGUINAL HERNIA REPAIR  11/27/2011   Procedure: HERNIA REPAIR INGUINAL ADULT;  Surgeon: Jetty Duhamel, MD;  Location: [facility];  Service: General;  Laterality: Right;  right inguinal hernia repair with mesh    Prior to Admission medications   Medication Sig Start Date End Date Taking? Authorizing Provider  allopurinol (ZYLOPRIM) 300 MG tablet TAKE 1 TABLET BY MOUTH EVERY DAY Patient taking  differently: Take 300 mg by mouth daily as needed (for gout flare-ups).  07/30/17   Alba Cory, MD  aspirin EC 81 MG tablet Take 1 tablet (81 mg total) by mouth daily. 12/20/16   Alba Cory, MD  atorvastatin (LIPITOR) 40 MG tablet Take 1 tablet (40 mg total) by mouth daily at 6 PM. 10/29/18   Altamese Dilling, MD  baclofen (LIORESAL) 10 MG tablet Take 1 tablet (10 mg total) by mouth 3 (three) times daily. Patient not taking: Reported on 10/28/2018 02/13/18   Cheryle Horsfall, NP  benazepril (LOTENSIN) 20 MG tablet TAKE 2 TABLETS BY MOUTH EVERY DAY Patient taking differently: Take 20 mg by mouth daily.  08/26/18   Poulose, Percell Belt, NP  triamcinolone (KENALOG) 0.025 % cream Apply 1 application topically 2 (two) times daily. Patient taking differently: Apply 1 application topically 2 (two) times daily as needed (for inflammation/itching).  03/22/18   Poulose, Percell Belt, NP  triamterene-hydrochlorothiazide (MAXZIDE-25) 37.5-25 MG tablet Take 1 tablet by mouth daily. Patient not taking: Reported on 10/28/2018 04/18/18   Cheryle Horsfall, NP    Allergies Norvasc [amlodipine]  Family History  Problem Relation Age of Onset  . Breast cancer Mother     Social History Social History   Tobacco Use  . Smoking status: Former Smoker    Types: Cigars    Last attempt to quit: 11/22/1988    Years since quitting: 29.9  . Smokeless tobacco:  Never Used  . Tobacco comment: Smokes off and on   Substance Use Topics  . Alcohol use: No    Comment: he quit 10/2016  . Drug use: No    Review of Systems  Constitutional: No fever. Eyes: No redness. ENT: No sore throat. Cardiovascular: Denies chest pain. Respiratory: Denies shortness of breath. Gastrointestinal: Positive for blood in the stool.  No vomiting.  Genitourinary: Negative for dysuria or hematuria.  Musculoskeletal: Negative for back pain. Skin: Negative for rash. Neurological: Negative for  headache.   ____________________________________________   PHYSICAL EXAM:  VITAL SIGNS: ED Triage Vitals  Enc Vitals Group     BP 11/01/18 2043 (!) 198/84     Pulse Rate 11/01/18 2043 (!) 104     Resp 11/01/18 2043 20     Temp 11/01/18 2043 98.4 F (36.9 C)     Temp Source 11/01/18 2043 Oral     SpO2 11/01/18 2043 100 %     Weight 11/01/18 2044 170 lb (77.1 kg)     Height 11/01/18 2044 5\' 7"  (1.702 m)     Head Circumference --      Peak Flow --      Pain Score 11/01/18 2044 0     Pain Loc --      Pain Edu? --      Excl. in GC? --     Constitutional: Alert and oriented. Well appearing and in no acute distress. Eyes: Conjunctivae are normal.  Head: Atraumatic. Nose: No congestion/rhinnorhea. Mouth/Throat: Mucous membranes are moist.   Neck: Normal range of motion.  Cardiovascular: Good peripheral circulation. Respiratory: Normal respiratory effort.  Gastrointestinal: Soft and nontender. No distention.  No visible external hemorrhoids or active hemorrhage. Genitourinary: No flank tenderness. Musculoskeletal:  Extremities warm and well perfused.  Neurologic:  Normal speech and language. No gross focal neurologic deficits are appreciated.  Skin:  Skin is warm and dry. No rash noted. Psychiatric: Mood and affect are normal. Speech and behavior are normal.  ____________________________________________   LABS (all labs ordered are listed, but only abnormal results are displayed)  Labs Reviewed  BASIC METABOLIC PANEL - Abnormal; Notable for the following components:      Result Value   Glucose, Bld 125 (*)    Calcium 8.6 (*)    All other components within normal limits  CBC WITH DIFFERENTIAL/PLATELET  PROTIME-INR  TYPE AND SCREEN   ____________________________________________  EKG   ____________________________________________  RADIOLOGY    ____________________________________________   PROCEDURES  Procedure(s) performed: No  Procedures  Critical  Care performed: No ____________________________________________   INITIAL IMPRESSION / ASSESSMENT AND PLAN / ED COURSE  Pertinent labs & imaging results that were available during my care of the patient were reviewed by me and considered in my medical decision making (see chart for details).  77 year old male with PMH as noted above presents with bright red blood in the stool acute onset this evening.  He had one episode of lightheadedness and diaphoresis while in the ED but he feels well otherwise now.  He reports a prior history many years ago of having blood in the stool although he is not sure if he was diagnosed with anything specific.  It has been some years since he had a colonoscopy.  On exam the patient is overall well-appearing and his vital signs are normal except for hypertension.  His abdomen is soft and nontender.  There are no external hemorrhoids visible on exam.  Overall presentation is most consistent with lower GI  bleed such as diverticular etiology.  Although the patient's vital signs are normal and his initial labs obtained from triage are reassuring, given what he describes as a relatively large volume of stool and blood as well as his age and the fact that he had an episode of lightheadedness or possible near syncope, I think it would be safest to admit him overnight for observation.  The patient agrees with this plan.  I signed the patient out to the hospitalist Dr. Anne HahnWillis.   ____________________________________________   FINAL CLINICAL IMPRESSION(S) / ED DIAGNOSES  Final diagnoses:  Lower GI bleed      NEW MEDICATIONS STARTED DURING THIS VISIT:  New Prescriptions   No medications on file     Note:  This document was prepared using Dragon voice recognition software and may include unintentional dictation errors.    Dionne BucySiadecki, Deondre Marinaro, MD 11/01/18 [phone]

## 2018-11-01 NOTE — ED Notes (Signed)
Report given to Dee RN.

## 2018-11-01 NOTE — ED Notes (Signed)
Pt reports large amounts of blood in stools that started about 30 minutes ago. Pt denies any pain at this time. Pt appears in NAD.

## 2018-11-02 ENCOUNTER — Encounter: Payer: Self-pay | Admitting: *Deleted

## 2018-11-02 ENCOUNTER — Observation Stay: Payer: Medicare Other | Admitting: Certified Registered"

## 2018-11-02 ENCOUNTER — Encounter
Admission: EM | Disposition: A | Discharge status: Home / Self Care | Payer: Self-pay | Source: Home / Self Care | Attending: Emergency Medicine

## 2018-11-02 ENCOUNTER — Other Ambulatory Visit: Payer: Self-pay

## 2018-11-02 DIAGNOSIS — E785 Hyperlipidemia, unspecified: Secondary | ICD-10-CM | POA: Diagnosis not present

## 2018-11-02 DIAGNOSIS — K317 Polyp of stomach and duodenum: Secondary | ICD-10-CM | POA: Diagnosis not present

## 2018-11-02 DIAGNOSIS — E119 Type 2 diabetes mellitus without complications: Secondary | ICD-10-CM

## 2018-11-02 DIAGNOSIS — E7800 Pure hypercholesterolemia, unspecified: Secondary | ICD-10-CM | POA: Diagnosis present

## 2018-11-02 DIAGNOSIS — K921 Melena: Secondary | ICD-10-CM

## 2018-11-02 DIAGNOSIS — I10 Essential (primary) hypertension: Secondary | ICD-10-CM | POA: Diagnosis not present

## 2018-11-02 DIAGNOSIS — K922 Gastrointestinal hemorrhage, unspecified: Secondary | ICD-10-CM | POA: Diagnosis not present

## 2018-11-02 HISTORY — PX: ESOPHAGOGASTRODUODENOSCOPY (EGD) WITH PROPOFOL: SHX5813

## 2018-11-02 LAB — BASIC METABOLIC PANEL
Anion gap: 6 (ref 5–15)
BUN: 17 mg/dL (ref 8–23)
CO2: 24 mmol/L (ref 22–32)
CREATININE: 1.04 mg/dL (ref 0.61–1.24)
Calcium: 8.3 mg/dL — ABNORMAL LOW (ref 8.9–10.3)
Chloride: 106 mmol/L (ref 98–111)
GFR calc Af Amer: >60 mL/min (ref >60)
GFR calc non Af Amer: >60 mL/min (ref >60)
Glucose, Bld: 160 mg/dL — ABNORMAL HIGH (ref 70–99)
Potassium: 4 mmol/L (ref 3.5–5.1)
Sodium: 136 mmol/L (ref 135–145)

## 2018-11-02 LAB — CBC
HCT: 43.4 % (ref 39.0–52.0)
Hemoglobin: 13.4 g/dL (ref 13.0–17.0)
MCH: 27.4 pg (ref 26.0–34.0)
MCHC: 30.9 g/dL (ref 30.0–36.0)
MCV: 88.8 fL (ref 80.0–100.0)
Platelets: 279 10*3/uL (ref 150–400)
RBC: 4.89 MIL/uL (ref 4.22–5.81)
RDW: 15.3 % (ref 11.5–15.5)
WBC: 5.5 10*3/uL (ref 4.0–10.5)
nRBC: 0 % (ref 0.0–0.2)

## 2018-11-02 LAB — GLUCOSE, CAPILLARY
Glucose-Capillary: 110 mg/dL — ABNORMAL HIGH (ref 70–99)
Glucose-Capillary: 109 mg/dL — ABNORMAL HIGH (ref 70–99)

## 2018-11-02 SURGERY — ESOPHAGOGASTRODUODENOSCOPY (EGD) WITH PROPOFOL
Anesthesia: General

## 2018-11-02 MED ORDER — PROPOFOL 10 MG/ML IV BOLUS
INTRAVENOUS | Status: DC | PRN
  Administered 2018-11-02: 40 mg via INTRAVENOUS

## 2018-11-02 MED ORDER — ATORVASTATIN CALCIUM 20 MG PO TABS: 40.0000 mg | ORAL_TABLET | Freq: Every day | ORAL | Status: DC

## 2018-11-02 MED ORDER — ACETAMINOPHEN 650 MG RE SUPP: 650.0000 mg | Freq: Four times a day (QID) | RECTAL | Status: DC | PRN

## 2018-11-02 MED ORDER — LIDOCAINE HCL (CARDIAC) PF 100 MG/5ML IV SOSY
PREFILLED_SYRINGE | INTRAVENOUS | Status: DC | PRN
  Administered 2018-11-02: 60 mg via INTRATRACHEAL

## 2018-11-02 MED ORDER — ACETAMINOPHEN 325 MG PO TABS: 650.0000 mg | ORAL_TABLET | Freq: Four times a day (QID) | ORAL | Status: DC | PRN

## 2018-11-02 MED ORDER — PROPOFOL 500 MG/50ML IV EMUL
INTRAVENOUS | Status: DC | PRN
  Administered 2018-11-02: 150 ug/kg/min via INTRAVENOUS

## 2018-11-02 MED ORDER — PROPOFOL 10 MG/ML IV BOLUS
INTRAVENOUS | Status: AC
  Filled 2018-11-02: qty 40

## 2018-11-02 MED ORDER — BENAZEPRIL HCL 20 MG PO TABS
20.0000 mg | ORAL_TABLET | Freq: Every day | ORAL | Status: DC
  Filled 2018-11-02: qty 1

## 2018-11-02 MED ORDER — ONDANSETRON HCL 4 MG PO TABS: 4.0000 mg | ORAL_TABLET | Freq: Four times a day (QID) | ORAL | Status: DC | PRN

## 2018-11-02 MED ORDER — SODIUM CHLORIDE 0.9 % IV SOLN
INTRAVENOUS | Status: DC | PRN
  Administered 2018-11-02: 11:00:00 via INTRAVENOUS

## 2018-11-02 MED ORDER — PANTOPRAZOLE SODIUM 40 MG IV SOLR
40.0000 mg | Freq: Once | INTRAVENOUS | Status: AC
  Administered 2018-11-02: 40 mg via INTRAVENOUS
  Filled 2018-11-02: qty 40

## 2018-11-02 MED ORDER — ONDANSETRON HCL 4 MG/2ML IJ SOLN: 4.0000 mg | Freq: Four times a day (QID) | INTRAMUSCULAR | Status: DC | PRN

## 2018-11-02 NOTE — ED Notes (Addendum)
Pt was being transported by this EDT and EDT Kadijah. Pt syncoped in route to 1A- 141. Receiving RN and NT notified. ED RN/ ED Charge and Admitting Dr. Anne HahnWillis was notified. Pt was out for less than 30secs. pts limbs became limp and pts eyes were wide open in a gaze. Pt was breathing during event. Pt arose and became alert to self. Pt now on 1A

## 2018-11-02 NOTE — Anesthesia Post-op Follow-up Note (Signed)
Anesthesia QCDR form completed.        

## 2018-11-02 NOTE — Progress Notes (Signed)
This patient's upper GI showed some gastric polyps that were not bleeding.  The polyps were numerous and were in the body of the stomach and the fundus.  These do not appear to be malignant.  The patient had biopsies taken of these and no source of the GI bleeding was seen.  Due to the patient having a normal EGD and a stable hemoglobin the patient should follow-up as an outpatient for colonoscopy.  I will sign off.  Please call if any further GI concerns or questions.  We would like to thank you for the opportunity to participate in the care of Dennis Ford.

## 2018-11-02 NOTE — Consult Note (Signed)
Dennis Minium, MD Dennis Ford  [address], [address] [place], [place] [address] Phone: [phone] Fax : [phone]  Consultation  Referring Provider:     Dr. Anne Ford Primary Care Physician:  Dennis Cory, MD Primary Gastroenterologist: Dennis Ford         Reason for Consultation:     GI   bleeding  Date of Admission:  11/01/2018 Date of Consultation:  11/02/2018         HPI:   Dennis Ford is a 77 y.o. male who reports that he had 3 episodes of dark stools with dark blood mixed with bright red blood.  He had a normal hemoglobin on admission and overnight the hemoglobin stayed stable.  The patient reports that he has not had a colonoscopy in many years and reports he thinks it is because he had hernia surgery in the past.  The patient reports that he had a similar episode about a year ago.  The patient takes a 81 mg of aspirin.  Patient has had no further signs of any GI bleeding since admission.  The patient had a hemoglobin of 14.2 on admission that went down to 13.4.  His hemoglobin runs between 15 and 16.5 on his blood work 1 year ago.  Past Medical History:  Diagnosis Date  . Diabetes mellitus without complication (HCC)   . Goiter   . History of inguinal hernia repair   . Hyperlipidemia   . Hypertension     Past Surgical History:  Procedure Laterality Date  . COLONOSCOPY    . CYST EXCISION Left 2008   Upper Back on Left Shoulder  . HERNIA REPAIR     [facility]   . INGUINAL HERNIA REPAIR  11/27/2011   Procedure: HERNIA REPAIR INGUINAL ADULT;  Surgeon: Dennis Duhamel, MD;  Location: [facility];  Service: General;  Laterality: Right;  right inguinal hernia repair with mesh    Prior to Admission medications   Medication Sig Start Date End Date Taking? Authorizing Provider  aspirin EC 81 MG tablet Take 1 tablet (81 mg total) by mouth daily. 12/20/16  Yes Dennis Ford, Dennis Hefty, MD  atorvastatin (LIPITOR) 40 MG tablet Take 1 tablet (40 mg total) by mouth daily at 6 PM.  10/29/18  Yes Dennis Dilling, MD  benazepril (LOTENSIN) 20 MG tablet TAKE 2 TABLETS BY MOUTH EVERY DAY Patient taking differently: Take 20 mg by mouth daily.  08/26/18  Yes Dennis Ford, Dennis Belt, NP  allopurinol (ZYLOPRIM) 300 MG tablet TAKE 1 TABLET BY MOUTH EVERY DAY Patient taking differently: Take 300 mg by mouth daily as needed (for gout flare-ups).  07/30/17   Dennis Cory, MD  baclofen (LIORESAL) 10 MG tablet Take 1 tablet (10 mg total) by mouth 3 (three) times daily. Patient not taking: Reported on 10/28/2018 02/13/18   Dennis Horsfall, NP  triamcinolone (KENALOG) 0.025 % cream Apply 1 application topically 2 (two) times daily. Patient taking differently: Apply 1 application topically 2 (two) times daily as needed (for inflammation/itching).  03/22/18   Dennis Ford, Dennis Belt, NP  triamterene-hydrochlorothiazide (MAXZIDE-25) 37.5-25 MG tablet Take 1 tablet by mouth daily. Patient not taking: Reported on 10/28/2018 04/18/18   Dennis Horsfall, NP    Family History  Problem Relation Age of Onset  . Breast cancer Mother      Social History   Tobacco Use  . Smoking status: Former Smoker    Types: Cigars    Last attempt to quit: 11/22/1988    Years since  quitting: 29.9  . Smokeless tobacco: Never Used  . Tobacco comment: Smokes off and on   Substance Use Topics  . Alcohol use: No    Comment: he quit 10/2016  . Drug use: No    Allergies as of 11/01/2018 - Review Complete 11/01/2018  Allergen Reaction Noted  . Norvasc [amlodipine] Other (See Comments) 01/09/2017    Review of Systems:    All systems reviewed and negative except where noted in HPI.   Physical Exam:  Vital signs in last 24 hours: Temp:  [98 F (36.7 C)-98.4 F (36.9 C)] 98 F (36.7 C) (12/28 0751) Pulse Rate:  [49-104] 88 (12/28 0751) Resp:  [14-20] 18 (12/28 0751) BP: (97-198)/(66-96) 125/78 (12/28 0751) SpO2:  [90 %-100 %] 100 % (12/28 0751) Weight:  [77.1 kg] 77.1 kg (12/27 2044) Last  BM Date: 11/02/18 General:   Pleasant, cooperative in NAD Head:  Normocephalic and atraumatic. Eyes:   No icterus.   Conjunctiva pink. PERRLA. Ears:  Normal auditory acuity. Neck:  Supple; no masses or thyroidomegaly Lungs: Respirations even and unlabored. Lungs clear to auscultation bilaterally.   No wheezes, crackles, or rhonchi.  Heart:  Regular rate and rhythm;  Without murmur, clicks, rubs or gallops Abdomen:  Soft, nondistended, nontender. Normal bowel sounds. No appreciable masses or hepatomegaly.  No rebound or guarding.  Rectal:  Not performed. Msk:  Symmetrical without gross deformities.    Extremities:  Without edema, cyanosis or clubbing. Neurologic:  Alert and oriented x3;  grossly normal neurologically. Skin:  Intact without significant lesions or rashes. Cervical Nodes:  No significant cervical adenopathy. Psych:  Alert and cooperative. Normal affect.  LAB RESULTS: Recent Labs    11/01/18 2106 11/02/18 0239  WBC 5.1 5.5  HGB 14.2 13.4  HCT 45.0 43.4  PLT 280 279   BMET Recent Labs    11/01/18 2106 11/02/18 0239  NA 135 136  K 4.0 4.0  CL 106 106  CO2 23 24  GLUCOSE 125* 160*  BUN 15 17  CREATININE 1.09 1.04  CALCIUM 8.6* 8.3*   LFT No results for input(s): PROT, ALBUMIN, AST, ALT, ALKPHOS, BILITOT, BILIDIR, IBILI in the last 72 hours. PT/INR Recent Labs    11/01/18 2106  LABPROT 12.8  INR 0.97    STUDIES: No results found.    Impression / Plan:   Assessment: Principal Problem:   GI bleed Active Problems:   Hypertension, benign   HLD (hyperlipidemia)   Diabetes (HCC)   Leda MinRobert Ford is a 77 y.o. y/o male with who was admitted with 3 episodes of dark stool with some mixed red blood.  The patient has not had a colonoscopy in many years.  The patient's hemoglobin has been stable and he has had no further GI bleeding.    Plan: The patient will be set up for an EGD for today due to his report of dark black stools despite having some report  of bright red blood with it.  Since the patient's hemoglobin is stable if the upper endoscopy is negative the patient should undergo a colonoscopy as an outpatient since he has not had one in many years. I have discussed risks & benefits which include, but are not limited to, bleeding, infection, perforation & drug reaction.  The patient agrees with this plan & written consent will be obtained.     Thank you for involving me in the care of this patient.      LOS: 0 days   Dennis Miniumarren Bernard Donahoo, MD  11/02/2018, 11:11 AM    Note: This dictation was prepared with Dragon dictation along with smaller phrase technology. Any transcriptional errors that result from this process are unintentional.

## 2018-11-02 NOTE — ED Notes (Signed)
On transport to the floor, pt experienced 2 episodes of syncope that were witnessed by EDT. This RN paged attending to inform him of the episodes.

## 2018-11-02 NOTE — ED Notes (Signed)
April Dan HumphreysWalker (daughter) [phone] and the password is [place].

## 2018-11-02 NOTE — H&P (Signed)
[facility] - [place] at [facility]   PATIENT NAME: Dennis MinRobert Sweetin    MR#:  [id number]  DATE OF BIRTH:  03/13/1941  DATE OF ADMISSION:  11/01/2018  PRIMARY CARE PHYSICIAN: Alba CorySowles, Krichna, MD   REQUESTING/REFERRING PHYSICIAN: Marisa SeverinSiadecki, MD  CHIEF COMPLAINT:   Chief Complaint  Patient presents with  . Rectal Bleeding    HISTORY OF PRESENT ILLNESS:  Dennis Ford  is a 77 y.o. male who presents with chief complaint as above.  Patient presents to the ED after 3 episodes of melena.  He states that this was stool mixed with dark red blood.  He denies any recent abdominal pain.  He states that he had an episode of bleeding similar to this years ago.  He is unsure that time if the cause was ever elucidated.  He does state that he recently was seen here for evaluation for possible stroke or "mini stroke".  He was placed on 81 mg aspirin, Lipitor, and blood pressure medication at that time.  He states that he seems to remember that when he had his last bleeding episode years ago he had been started on aspirin at that time too.  He stopped taking it due to that bleeding event.  His hemoglobin is stable here tonight.  Hospitalist were called for admission  PAST MEDICAL HISTORY:   Past Medical History:  Diagnosis Date  . Diabetes mellitus without complication (HCC)   . Goiter   . History of inguinal hernia repair   . Hyperlipidemia   . Hypertension      PAST SURGICAL HISTORY:   Past Surgical History:  Procedure Laterality Date  . COLONOSCOPY    . CYST EXCISION Left 2008   Upper Back on Left Shoulder  . HERNIA REPAIR     [facility]   . INGUINAL HERNIA REPAIR  11/27/2011   Procedure: HERNIA REPAIR INGUINAL ADULT;  Surgeon: Jetty DuhamelJames O Wyatt III, MD;  Location:  SURGERY CENTER;  Service: General;  Laterality: Right;  right inguinal hernia repair with mesh     SOCIAL HISTORY:   Social History   Tobacco Use  . Smoking status: Former Smoker    Types: Cigars     Last attempt to quit: 11/22/1988    Years since quitting: 29.9  . Smokeless tobacco: Never Used  . Tobacco comment: Smokes off and on   Substance Use Topics  . Alcohol use: No    Comment: he quit 10/2016     FAMILY HISTORY:   Family History  Problem Relation Age of Onset  . Breast cancer Mother      DRUG ALLERGIES:   Allergies  Allergen Reactions  . Norvasc [Amlodipine] Other (See Comments)    angioedema    MEDICATIONS AT HOME:   Prior to Admission medications   Medication Sig Start Date End Date Taking? Authorizing Provider  aspirin EC 81 MG tablet Take 1 tablet (81 mg total) by mouth daily. 12/20/16  Yes Sowles, Danna HeftyKrichna, MD  atorvastatin (LIPITOR) 40 MG tablet Take 1 tablet (40 mg total) by mouth daily at 6 PM. 10/29/18  Yes Altamese DillingVachhani, Vaibhavkumar, MD  benazepril (LOTENSIN) 20 MG tablet TAKE 2 TABLETS BY MOUTH EVERY DAY Patient taking differently: Take 20 mg by mouth daily.  08/26/18  Yes Poulose, Percell BeltElizabeth E, NP  allopurinol (ZYLOPRIM) 300 MG tablet TAKE 1 TABLET BY MOUTH EVERY DAY Patient taking differently: Take 300 mg by mouth daily as needed (for gout flare-ups).  07/30/17   Alba CorySowles, Krichna, MD  baclofen (LIORESAL) 10 MG tablet Take 1 tablet (10 mg total) by mouth 3 (three) times daily. Patient not taking: Reported on 10/28/2018 02/13/18   Cheryle HorsfallPoulose, Elizabeth E, NP  triamcinolone (KENALOG) 0.025 % cream Apply 1 application topically 2 (two) times daily. Patient taking differently: Apply 1 application topically 2 (two) times daily as needed (for inflammation/itching).  03/22/18   Poulose, Percell BeltElizabeth E, NP  triamterene-hydrochlorothiazide (MAXZIDE-25) 37.5-25 MG tablet Take 1 tablet by mouth daily. Patient not taking: Reported on 10/28/2018 04/18/18   Cheryle HorsfallPoulose, Elizabeth E, NP    REVIEW OF SYSTEMS:  Review of Systems  Constitutional: Negative for chills, fever, malaise/fatigue and weight loss.  HENT: Negative for ear pain, hearing loss and tinnitus.   Eyes: Negative  for blurred vision, double vision, pain and redness.  Respiratory: Negative for cough, hemoptysis and shortness of breath.   Cardiovascular: Negative for chest pain, palpitations, orthopnea and leg swelling.  Gastrointestinal: Positive for melena. Negative for abdominal pain, constipation, diarrhea, nausea and vomiting.  Genitourinary: Negative for dysuria, frequency and hematuria.  Musculoskeletal: Negative for back pain, joint pain and neck pain.  Skin:       No acne, rash, or lesions  Neurological: Negative for dizziness, tremors, focal weakness and weakness.  Endo/Heme/Allergies: Negative for polydipsia. Does not bruise/bleed easily.  Psychiatric/Behavioral: Negative for depression. The patient is not nervous/anxious and does not have insomnia.      VITAL SIGNS:   Vitals:   11/01/18 2130 11/01/18 2230 11/01/18 2300 11/01/18 2330  BP: 140/87 112/69 128/71 115/66  Pulse: 90 68 72 70  Resp:  18    Temp:      TempSrc:      SpO2: 98% 99% 100% 100%  Weight:      Height:       Wt Readings from Last 3 Encounters:  11/01/18 77.1 kg  10/28/18 79.4 kg  03/22/18 81.6 kg    PHYSICAL EXAMINATION:  Physical Exam  Vitals reviewed. Constitutional: He is oriented to person, place, and time. He appears well-developed and well-nourished. No distress.  HENT:  Head: Normocephalic and atraumatic.  Mouth/Throat: Oropharynx is clear and moist.  Eyes: Pupils are equal, round, and reactive to light. Conjunctivae and EOM are normal. No scleral icterus.  Neck: Normal range of motion. Neck supple. No JVD present. No thyromegaly present.  Cardiovascular: Normal rate, regular rhythm and intact distal pulses. Exam reveals no gallop and no friction rub.  No murmur heard. Respiratory: Effort normal and breath sounds normal. No respiratory distress. He has no wheezes. He has no rales.  GI: Soft. Bowel sounds are normal. He exhibits no distension. There is no abdominal tenderness.  Musculoskeletal:  Normal range of motion.        General: No edema.     Comments: No arthritis, no gout  Lymphadenopathy:    He has no cervical adenopathy.  Neurological: He is alert and oriented to person, place, and time. No cranial nerve deficit.  No dysarthria, no aphasia  Skin: Skin is warm and dry. No rash noted. No erythema.  Psychiatric: He has a normal mood and affect. His behavior is normal. Judgment and thought content normal.    LABORATORY PANEL:   CBC Recent Labs  Lab 11/01/18 2106  WBC 5.1  HGB 14.2  HCT 45.0  PLT 280   ------------------------------------------------------------------------------------------------------------------  Chemistries  Recent Labs  Lab 10/28/18 1337 11/01/18 2106  NA 138 135  K 4.0 4.0  CL 105 106  CO2 26 23  GLUCOSE 98  125*  BUN 11 15  CREATININE 1.03 1.09  CALCIUM 8.8* 8.6*  AST 20  --   ALT 12  --   ALKPHOS 48  --   BILITOT 0.7  --    ------------------------------------------------------------------------------------------------------------------  Cardiac Enzymes Recent Labs  Lab 10/28/18 1337  TROPONINI <0.03   ------------------------------------------------------------------------------------------------------------------  RADIOLOGY:  No results found.  EKG:   Orders placed or performed during the hospital encounter of 10/28/18  . EKG 12-Lead  . EKG 12-Lead  . ED EKG  . ED EKG  . EKG    IMPRESSION AND PLAN:  Principal Problem:   GI bleed -hemoglobin stable, continue to monitor.  Patient is perhaps very sensitive to aspirin.  GI consult for assistance in further diagnosis and treatment. Active Problems:   Hypertension, benign -continue home meds   Diabetes (HCC) -routine fingerstick glucose checks, can add supplemental insulin if needed   HLD (hyperlipidemia) -continue home meds  Chart review performed and case discussed with ED provider. Labs, imaging and/or ECG reviewed by provider and discussed with  patient/family. Management plans discussed with the patient and/or family.  DVT PROPHYLAXIS: Mechanical only  GI PROPHYLAXIS:  None  ADMISSION STATUS: Observation  CODE STATUS: Full Code Status History    Date Active Date Inactive Code Status Order ID Comments User Context   10/28/2018 2039 10/29/2018 1731 Full Code [id number]  Mayo, Allyn Kenner, MD Inpatient      TOTAL TIME TAKING CARE OF THIS PATIENT: 40 minutes.   Mayah Urquidi FIELDING 11/02/2018, 12:57 AM  [facility]  Office  [phone]  CC: Primary care physician; Alba Cory, MD  Note:  This document was prepared using Dragon voice recognition software and may include unintentional dictation errors.

## 2018-11-02 NOTE — Progress Notes (Signed)
Nsg Discharge Note  Admit Date:  11/01/2018 Discharge date: 11/02/2018   Leda Minobert Level to be D/C'd Home per MD order.  AVS completed.  Copy for chart, and copy for patient signed, and dated. Patient/caregiver able to verbalize understanding.  Discharge Medication: Allergies as of 11/02/2018      Reactions   Norvasc [amlodipine] Other (See Comments)   angioedema      Medication List    STOP taking these medications   baclofen 10 MG tablet Commonly known as:  LIORESAL   triamterene-hydrochlorothiazide 37.5-25 MG tablet Commonly known as:  MAXZIDE-25     TAKE these medications   allopurinol 300 MG tablet Commonly known as:  ZYLOPRIM TAKE 1 TABLET BY MOUTH EVERY DAY What changed:    when to take this  reasons to take this   aspirin EC 81 MG tablet Take 1 tablet (81 mg total) by mouth daily.   atorvastatin 40 MG tablet Commonly known as:  LIPITOR Take 1 tablet (40 mg total) by mouth daily at 6 PM.   benazepril 20 MG tablet Commonly known as:  LOTENSIN TAKE 2 TABLETS BY MOUTH EVERY DAY What changed:  how much to take   triamcinolone 0.025 % cream Commonly known as:  KENALOG Apply 1 application topically 2 (two) times daily. What changed:    when to take this  reasons to take this       Discharge Assessment: Vitals:   11/02/18 1136 11/02/18 1150  BP: 119/73 111/72  Pulse: 93 80  Resp: 14 18  Temp:  98.1 F (36.7 C)  SpO2: 100% 100%   Skin clean, dry and intact without evidence of skin break down, no evidence of skin tears noted. IV catheter discontinued intact. Site without signs and symptoms of complications - no redness or edema noted at insertion site, patient denies c/o pain - only slight tenderness at site.  Dressing with slight pressure applied.  D/c Instructions-Education: Discharge instructions given to patient/family with verbalized understanding. D/c education completed with patient/family including follow up instructions, medication list,  d/c activities limitations if indicated, with other d/c instructions as indicated by MD - patient able to verbalize understanding, all questions fully answered. Patient instructed to return to ED, call 911, or call MD for any changes in condition.  Patient escorted via WC, and D/C home via private auto.  Adair LaundryElizabeth A Tanaya Dunigan, RN 11/02/2018 1:40 PM

## 2018-11-02 NOTE — Anesthesia Preprocedure Evaluation (Signed)
Anesthesia Evaluation  Patient identified by MRN, date of birth, ID band Patient awake    Reviewed: Allergy & Precautions, NPO status , Patient's Chart, lab work & pertinent test results, reviewed documented beta blocker date and time   Airway Mallampati: III  TM Distance: >3 FB     Dental  (+) Upper Dentures, Lower Dentures   Pulmonary former smoker,           Cardiovascular hypertension, Pt. on medications      Neuro/Psych    GI/Hepatic   Endo/Other  diabetes, Type 2  Renal/GU      Musculoskeletal   Abdominal   Peds  Hematology   Anesthesia Other Findings EKG shows poor R wave progression, otherwise OK. No cardiac symptoms.  Reproductive/Obstetrics                             Anesthesia Physical Anesthesia Plan  ASA: III  Anesthesia Plan: General   Post-op Pain Management:    Induction: Intravenous  PONV Risk Score and Plan:   Airway Management Planned:   Additional Equipment:   Intra-op Plan:   Post-operative Plan:   Informed Consent: I have reviewed the patients History and Physical, chart, labs and discussed the procedure including the risks, benefits and alternatives for the proposed anesthesia with the patient or authorized representative who has indicated his/her understanding and acceptance.     Plan Discussed with: CRNA  Anesthesia Plan Comments:         Anesthesia Quick Evaluation

## 2018-11-02 NOTE — Discharge Instructions (Signed)
Gastrointestinal Bleeding ° °Gastrointestinal bleeding is bleeding somewhere along the path food travels through the body (digestive tract). This path is anywhere between the mouth and the opening of the butt (anus). You may have blood in your poop (stools) or have black poop. If you throw up (vomit), there may be blood in it. °This condition can be mild, serious, or even life-threatening. If you have a lot of bleeding, you may need to stay in the hospital. °Follow these instructions at home: °· Take over-the-counter and prescription medicines only as told by your doctor. °· Eat foods that have a lot of fiber in them. These foods include whole grains, fruits, and vegetables. You can also try eating 1-3 prunes each day. °· Drink enough fluid to keep your pee (urine) clear or pale yellow. °· Keep all follow-up visits as told by your doctor. This is important. °Contact a doctor if: °· Your symptoms do not get better. °Get help right away if: °· Your bleeding gets worse. °· You feel dizzy or you pass out (faint). °· You feel weak. °· You have very bad cramps in your back or belly (abdomen). °· You pass large clumps of blood (clots) in your poop. °· Your symptoms are getting worse. °This information is not intended to replace advice given to you by your health care provider. Make sure you discuss any questions you have with your health care provider. °Document Released: 08/01/2008 Document Revised: 03/30/2016 Document Reviewed: 04/12/2015 °Elsevier Interactive Patient Education © 2019 [employer]. ° °

## 2018-11-02 NOTE — Progress Notes (Signed)
Advanced care plan.  Purpose of the Encounter: CODE STATUS  Parties in Attendance: Patient  Patient's Decision Capacity: Good  Subjective/Patient's story: Presented to the emergency room for stool mixed with dark blood   Objective/Medical story Patient had 3 episodes of melena Needs gastroenterology evaluation and endoscopy Has been on aspirin   Goals of care determination:  Advance care directives and goals of care discussed Treatment plan discussed Patient wants everthing done which includes cpr, intubation and ventilator if need arises.   CODE STATUS: Full code   Time spent discussing advanced care planning: 16 minutes

## 2018-11-02 NOTE — Op Note (Signed)
[facility] Patient Name: Dennis Ford Procedure Date: 11/02/2018 10:44 AM MRN: [id number] Account #: [id number] Date of Birth: 14-Jan-1941 Admit Type: Inpatient Age: 77 Room: [facility] ENDO ROOM 4 Gender: Male Note Status: Finalized Procedure:            Upper GI endoscopy Indications:          Melena Providers:            Midge Minium MD, MD Referring MD:         Onnie Boer. Sowles, MD (Referring MD) Medicines:            Propofol per Anesthesia Complications:        No immediate complications. Procedure:            Pre-Anesthesia Assessment:                       - Prior to the procedure, a History and Physical was                        performed, and patient medications and allergies were                        reviewed. The patient's tolerance of previous                        anesthesia was also reviewed. The risks and benefits of                        the procedure and the sedation options and risks were                        discussed with the patient. All questions were                        answered, and informed consent was obtained. Prior                        Anticoagulants: The patient has taken no previous                        anticoagulant or antiplatelet agents. ASA Grade                        Assessment: II - A patient with mild systemic disease.                        After reviewing the risks and benefits, the patient was                        deemed in satisfactory condition to undergo the                        procedure.                       After obtaining informed consent, the endoscope was                        passed under direct vision. Throughout the procedure,  the patient's blood pressure, pulse, and oxygen                        saturations were monitored continuously. The Endoscope                        was introduced through the mouth, and advanced to the                        second  part of duodenum. The upper GI endoscopy was                        accomplished without difficulty. The patient tolerated                        the procedure well. Findings:      A small hiatal hernia was present.      Multiple pedunculated and sessile polyps with no bleeding and no       stigmata of recent bleeding were found in the gastric fundus and in the       gastric body. Biopsies were taken with a cold forceps for histology.      The examined duodenum was normal. Impression:           - Small hiatal hernia.                       - Multiple gastric polyps. Biopsied.                       - Normal examined duodenum. Recommendation:       - Return patient to hospital ward for ongoing care.                       - Advance diet as tolerated.                       - Continue present medications.                       - Await pathology results.                       - The patient will need an outpatient colonoscopy.                       No sign of any Upper GI bleeding.                       Hb stable. Procedure Code(s):    --- Professional ---                       [phone], Esophagogastroduodenoscopy, flexible, transoral;                        with biopsy, single or multiple Diagnosis Code(s):    --- Professional ---                       K92.1, Melena (includes Hematochezia)                       K31.7, Polyp of stomach and duodenum CPT  copyright 2018 American Medical Association. All rights reserved. The codes documented in this report are preliminary and upon coder review may  be revised to meet current compliance requirements. Midge Miniumarren Henson Fraticelli MD, MD 11/02/2018 11:06:46 AM This report has been signed electronically. Number of Addenda: 0 Note Initiated On: 11/02/2018 10:44 AM      [facility]

## 2018-11-02 NOTE — Care Management Obs Status (Signed)
MEDICARE OBSERVATION STATUS NOTIFICATION   Patient Details  Name: Dennis Ford MRN: [id number] Date of Birth: January 27, 1941   Medicare Observation Status Notification Given:  Yes    Joren Rehm A Sandra Brents, RN 11/02/2018, 9:25 AM

## 2018-11-02 NOTE — Transfer of Care (Signed)
Immediate Anesthesia Transfer of Care Note  Patient: Leda MinRobert Moffatt  Procedure(s) Performed: ESOPHAGOGASTRODUODENOSCOPY (EGD) WITH PROPOFOL (N/A )  Patient Location: PACU  Anesthesia Type:General  Level of Consciousness: awake, alert  and oriented  Airway & Oxygen Therapy: Patient Spontanous Breathing and Patient connected to nasal cannula oxygen  Post-op Assessment: Report given to RN and Post -op Vital signs reviewed and stable  Post vital signs: Reviewed and stable  Last Vitals:  Vitals Value Taken Time  BP    Temp    Pulse    Resp    SpO2      Last Pain:  Vitals:   11/02/18 0751  TempSrc: Oral  PainSc:          Complications: No apparent anesthesia complications

## 2018-11-02 NOTE — Discharge Summary (Signed)
[facility] [place] [facility]   PATIENT NAME: Dennis Ford    MR#:  [id number]  DATE OF BIRTH:  09/16/1941  DATE OF ADMISSION:  11/01/2018 ADMITTING PHYSICIAN: Oralia Manisavid Willis, MD  DATE OF DISCHARGE: 12/282/109  PRIMARY CARE PHYSICIAN: Alba CorySowles, Krichna, MD   ADMISSION DIAGNOSIS:  Lower GI bleed [K92.2] Hypertension Hyperlipidemia Type 2 diabetes mellitus DISCHARGE DIAGNOSIS:  Gastric polyps Hypertension Hyperlipidemia Type 2 diabetes mellitus  SECONDARY DIAGNOSIS:   Past Medical History:  Diagnosis Date  . Diabetes mellitus without complication (HCC)   . Goiter   . History of inguinal hernia repair   . Hyperlipidemia   . Hypertension      ADMITTING HISTORY Dennis Ford  is a 77 y.o. male who presents with chief complaint as above.  Patient presents to the ED after 3 episodes of melena.  He states that this was stool mixed with dark red blood.  He denies any recent abdominal pain.  He states that he had an episode of bleeding similar to this years ago.  He is unsure that time if the cause was ever elucidated.  He does state that he recently was seen here for evaluation for possible stroke or "mini stroke".  He was placed on 81 mg aspirin, Lipitor, and blood pressure medication at that time.  He states that he seems to remember that when he had his last bleeding episode years ago he had been started on aspirin at that time too.  He stopped taking it due to that bleeding event.  His hemoglobin is stable here tonight.  Hospitalist were called for admission   HOSPITAL COURSE:  Admitted to medical floor.  Hemoglobin was monitored closely and was stable.  His aspirin was high.  Patient received IV fluids during the hospitalization.  He was evaluated by gastroenterologist on call and patient had an upper endoscopy which showed gastric polyps but no evidence of bleeding.  Polyps were removed and sent for pathology.  Discussed with gastroenterologist who  recommended patient can be discharged home.  We will hold aspirin for a week and to be reassessed by primary care physician again to restart the medication.  Patient hemodynamically stable will be discharged home and follow-up with GI services for outpatient colonoscopy.  Gastroenterology recommended outpatient colonoscopy.  CONSULTS OBTAINED:    DRUG ALLERGIES:   Allergies  Allergen Reactions  . Norvasc [Amlodipine] Other (See Comments)    angioedema    DISCHARGE MEDICATIONS:   Allergies as of 11/02/2018      Reactions   Norvasc [amlodipine] Other (See Comments)   angioedema      Medication List    STOP taking these medications   baclofen 10 MG tablet Commonly known as:  LIORESAL   triamterene-hydrochlorothiazide 37.5-25 MG tablet Commonly known as:  MAXZIDE-25     TAKE these medications   allopurinol 300 MG tablet Commonly known as:  ZYLOPRIM TAKE 1 TABLET BY MOUTH EVERY DAY What changed:    when to take this  reasons to take this   aspirin EC 81 MG tablet Take 1 tablet (81 mg total) by mouth daily.   atorvastatin 40 MG tablet Commonly known as:  LIPITOR Take 1 tablet (40 mg total) by mouth daily at 6 PM.   benazepril 20 MG tablet Commonly known as:  LOTENSIN TAKE 2 TABLETS BY MOUTH EVERY DAY What changed:  how much to take   triamcinolone 0.025 % cream Commonly known as:  KENALOG Apply 1 application topically 2 (two)  times daily. What changed:    when to take this  reasons to take this       Today  Patient seen and evaluated today No new episodes of melena No vomiting of blood No abdominal pain Had upper endoscopy Tolerated diet well Hemoglobin stable patient Hemodynamically stable will be discharged home Discussed with gastroenterology  VITAL SIGNS:  Blood pressure 111/72, pulse 80, temperature 98.1 F (36.7 C), temperature source Oral, resp. rate 18, height 5\' 7"  (1.702 m), weight 77.1 kg, SpO2 100 %.  I/O:    Intake/Output  Summary (Last 24 hours) at 11/02/2018 1315 Last data filed at 11/02/2018 1104 Gross per 24 hour  Intake 200 ml  Output -  Net 200 ml    PHYSICAL EXAMINATION:  Physical Exam  GENERAL:  77 y.o.-year-old patient lying in the bed with no acute distress.  LUNGS: Normal breath sounds bilaterally, no wheezing, rales,rhonchi or crepitation. No use of accessory muscles of respiration.  CARDIOVASCULAR: S1, S2 normal. No murmurs, rubs, or gallops.  ABDOMEN: Soft, non-tender, non-distended. Bowel sounds present. No organomegaly or mass.  NEUROLOGIC: Moves all 4 extremities. PSYCHIATRIC: The patient is alert and oriented x 3.  SKIN: No obvious rash, lesion, or ulcer.   DATA REVIEW:   CBC Recent Labs  Lab 11/02/18 0239  WBC 5.5  HGB 13.4  HCT 43.4  PLT 279    Chemistries  Recent Labs  Lab 10/28/18 1337  11/02/18 0239  NA 138   < > 136  K 4.0   < > 4.0  CL 105   < > 106  CO2 26   < > 24  GLUCOSE 98   < > 160*  BUN 11   < > 17  CREATININE 1.03   < > 1.04  CALCIUM 8.8*   < > 8.3*  AST 20  --   --   ALT 12  --   --   ALKPHOS 48  --   --   BILITOT 0.7  --   --    < > = values in this interval not displayed.    Cardiac Enzymes Recent Labs  Lab 10/28/18 1337  TROPONINI <0.03    Microbiology Results  Results for orders placed or performed in visit on 03/19/17  Wound culture     Status: None   Collection Time: 03/19/17  3:25 PM  Result Value Ref Range Status   Culture STAPHYLOCOCCUS AUREUS  Final    Comment: SOURCE->PARONYCHIA; SOURCE: WOUND&WOUND   Gram Stain No WBC Seen  Final   Gram Stain No Squamous Epithelial Cells Seen  Final   Gram Stain Few Gram Positive Cocci In Pairs  Final    Comment: Rifampin and Gentamicin should not be used as single drugs for treatment of Staph infections.    Colony Count Abundant  Final   Organism ID, Bacteria STAPHYLOCOCCUS AUREUS  Final      Susceptibility   Staphylococcus aureus -  (no method available)    OXACILLIN <=0.25  Sensitive     CEFAZOLIN  Sensitive     GENTAMICIN <=0.5 Sensitive     CIPROFLOXACIN <=0.5 Sensitive     LEVOFLOXACIN <=0.12 Sensitive     MOXIFLOXACIN <=0.25 Sensitive     TRIMETH/SULFA <=10 Sensitive     VANCOMYCIN 1 Sensitive     CLINDAMYCIN <=0.25 Sensitive     ERYTHROMYCIN <=0.25 Sensitive     LINEZOLID 1 Sensitive     QUINUPRISTIN/DALF <=0.25 Sensitive     RIFAMPIN <=0.5  Sensitive     TETRACYCLINE <=1 Sensitive     RADIOLOGY:  No results found.  Follow up with PCP in 1 week.  Management plans discussed with the patient, family and they are in agreement.  CODE STATUS: Full code    Code Status Orders  (From admission, onward)         Start     Ordered   11/02/18 0242  Full code  Continuous     11/02/18 0241        Code Status History    Date Active Date Inactive Code Status Order ID Comments User Context   10/28/2018 2039 10/29/2018 1731 Full Code [id number]  Mayo, Allyn Kenner, MD Inpatient      TOTAL TIME TAKING CARE OF THIS PATIENT ON DAY OF DISCHARGE: more than 35 minutes.   Ihor Austin M.D on 11/02/2018 at 1:15 PM  Between 7am to 6pm - Pager - [phone]  After 6pm go to [email or web address] - password [email or web address] [facility]  [facility] [place] [facility]  Office  [phone]  CC: Primary care physician; Alba Cory, MD  Note: This dictation was prepared with Dragon dictation along with smaller phrase technology. Any transcriptional errors that result from this process are unintentional.

## 2018-11-02 NOTE — Plan of Care (Signed)
  Problem: Safety: Goal: Ability to remain free from injury will improve Outcome: Progressing   Problem: Safety: Goal: Ability to remain free from injury will improve Outcome: Progressing   

## 2018-11-02 NOTE — Anesthesia Postprocedure Evaluation (Signed)
Anesthesia Post Note  Patient: Dennis MinRobert Ford  Procedure(s) Performed: ESOPHAGOGASTRODUODENOSCOPY (EGD) WITH PROPOFOL (N/A )  Patient location during evaluation: Endoscopy Anesthesia Type: General Level of consciousness: awake and alert Pain management: pain level controlled Vital Signs Assessment: post-procedure vital signs reviewed and stable Respiratory status: spontaneous breathing, nonlabored ventilation, respiratory function stable and patient connected to nasal cannula oxygen Cardiovascular status: blood pressure returned to baseline and stable Postop Assessment: no apparent nausea or vomiting Anesthetic complications: no     Last Vitals:  Vitals:   11/02/18 1136 11/02/18 1150  BP: 119/73 111/72  Pulse: 93 80  Resp: 14 18  Temp:  36.7 C  SpO2: 100% 100%    Last Pain:  Vitals:   11/02/18 1150  TempSrc: Oral  PainSc:                  [name]

## 2018-11-04 ENCOUNTER — Encounter: Payer: Self-pay | Admitting: Gastroenterology

## 2018-11-07 ENCOUNTER — Ambulatory Visit (INDEPENDENT_AMBULATORY_CARE_PROVIDER_SITE_OTHER): Payer: Medicare Other | Admitting: Family Medicine

## 2018-11-07 ENCOUNTER — Encounter: Payer: Self-pay | Admitting: Family Medicine

## 2018-11-07 ENCOUNTER — Encounter: Payer: Self-pay | Admitting: Gastroenterology

## 2018-11-07 VITALS — BP 150/80 | HR 90 | Temp 98.0°F | Resp 16 | Ht 68.0 in | Wt 180.5 lb

## 2018-11-07 DIAGNOSIS — I10 Essential (primary) hypertension: Secondary | ICD-10-CM

## 2018-11-07 DIAGNOSIS — K922 Gastrointestinal hemorrhage, unspecified: Secondary | ICD-10-CM | POA: Diagnosis not present

## 2018-11-07 LAB — SURGICAL PATHOLOGY

## 2018-11-07 NOTE — Progress Notes (Signed)
Name: Dennis Ford   MRN: [id number]    DOB: 1941-09-25   Date:11/07/2018       Progress Note  Subjective  Chief Complaint  Chief Complaint  Patient presents with  . Hospitalization Follow-up    GI bleed    HPI  Pt presents for hospital follow up:  Was discharged on 11/02/2018 after admission on 11/01/2018 for Lower GI bleed.  He had a stroke and was admitted on 10/28/2018 and was put on aspirin, he then returned on 11/01/2018 due to rectal bleeding. He had a significant amount of BRB in the beginning, but has not had any BRB or dark and tarry stools in about 3 days.  Denies abdominal pain, chest pain, shortness of breath, diarrhea, or constipation, no nausea or vomiting.   - Medication changes - stopped aspirin; stopped allopurinol, stopped atorvastatin.  Was told to take 40mg  lotensin, he did this for a couple of days but got lightheaded so he went back to taking 20mg  daily.  We discussed his elevated BP today, and he is willing to try taking 20mg  in the AM and 20mg  QHS. - CBC remained stable the entire time - no anemia, no transfusions.  - Upper Endoscopy - performed by Dr. Servando Snare while admitted -  had some polyps that were send to pathology. Also had small hiatal hernia noted.  Has follow up appt on 11/26/18  Patient Active Problem List   Diagnosis Date Noted  . GI bleed 11/02/2018  . HLD (hyperlipidemia) 11/02/2018  . Diabetes (HCC) 11/02/2018  . Hematochezia   . Right hand weakness 10/28/2018  . Intermittent low back pain 12/20/2016  . Hyperglycemia 06/01/2016  . Hypertension, benign 05/31/2016  . Dermatitis 05/31/2016  . 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 Abnormal TSH 06/04/2015    Past Surgical History:  Procedure Laterality Date  . COLONOSCOPY    . CYST EXCISION Left 2008   Upper Back on Left Shoulder  . ESOPHAGOGASTRODUODENOSCOPY (EGD) WITH PROPOFOL N/A 11/02/2018   Procedure: ESOPHAGOGASTRODUODENOSCOPY (EGD) WITH PROPOFOL;  Surgeon: Midge Minium, MD;  Location[facility]  ENDOSCOPY;  Service: Endoscopy;  Laterality: N/A;  . HERNIA REPAIR     LIH   . INGUINAL HERNIA REPAIR  11/27/2011   Procedure: HERNIA REPAIR INGUINAL ADULT;  Surgeon: Jetty Duhamel, MD;  Location: [facility];  Service: General;  Laterality: Right;  right inguinal hernia repair with mesh    Family History  Problem Relation Age of Onset  . Breast cancer Mother     Social History   Socioeconomic History  . Marital status: Married    Spouse name: Not on file  . Number of children: Not on file  . Years of education: Not on file  . Highest education level: Not on file  Occupational History  . Not on file  Social Needs  . Financial resource strain: Not hard at all  . Food insecurity:    Worry: Never true    Inability: Never true  . Transportation needs:    Medical: Not on file    Non-medical: Not on file  Tobacco Use  . Smoking status: Former Smoker    Types: Cigars    Last attempt to quit: 11/22/1988    Years since quitting: 29.9  . Smokeless tobacco: Never Used  . Tobacco comment: Smokes off and on   Substance and Sexual Activity  . Alcohol use: No    Comment: he quit 10/2016  . Drug use: No  . Sexual activity:  Yes    Partners: Female  Lifestyle  . Physical activity:    Days per week: Not on file    Minutes per session: Not on file  . Stress: Not on file  Relationships  . Social connections:    Talks on phone: Not on file    Gets together: Not on file    Attends religious service: Not on file    Active member of club or organization: Not on file    Attends meetings of clubs or organizations: Not on file    Relationship status: Not on file  . Intimate partner violence:    Fear of current or ex partner: Not on file    Emotionally abused: Not on file    Physically abused: Not on file    Forced sexual activity: Not on file  Other Topics Concern  . Not on file  Social History Narrative  . Not on file     Current Outpatient Medications:  .   benazepril (LOTENSIN) 20 MG tablet, TAKE 2 TABLETS BY MOUTH EVERY DAY (Patient taking differently: Take 20 mg by mouth daily. ), Disp: 60 tablet, Rfl: 0 .  triamcinolone (KENALOG) 0.025 % cream, Apply 1 application topically 2 (two) times daily. (Patient taking differently: Apply 1 application topically 2 (two) times daily as needed (for inflammation/itching). ), Disp: 30 g, Rfl: 1 .  allopurinol (ZYLOPRIM) 300 MG tablet, TAKE 1 TABLET BY MOUTH EVERY DAY, Disp: 90 tablet, Rfl: 1  Allergies  Allergen Reactions  . Norvasc [Amlodipine] Other (See Comments)    angioedema    I personally reviewed active problem list, medication list, allergies, notes from last encounter, lab results with the patient/caregiver today.   ROS Constitutional: Negative for fever or weight change.  Respiratory: Negative for cough and shortness of breath.   Cardiovascular: Negative for chest pain or palpitations.  Gastrointestinal: Negative for abdominal pain, no bowel changes.  Musculoskeletal: Negative for gait problem or joint swelling.  Skin: Negative for rash.  Neurological: Negative for dizziness or headache.  No other specific complaints in a complete review of systems (except as listed in HPI above).  Objective  Vitals:   11/07/18 1101 11/07/18 1109  BP: (!) 160/70 (!) 150/80  Pulse: 90   Resp: 16   Temp: 98 F (36.7 C)   TempSrc: Oral   SpO2: 99%   Weight: 180 lb 8 oz (81.9 kg)   Height: 5\' 8"  (1.727 m)    Body mass index is 27.44 kg/m.  Physical Exam Constitutional: Patient appears well-developed and well-nourished. No distress.  HENT: Head: Normocephalic and atraumatic. Eyes: Conjunctivae and EOM are normal. No scleral icterus.   Neck: Normal range of motion. Neck supple. No JVD present. No thyromegaly present.  Cardiovascular: Normal rate, regular rhythm and normal heart sounds.  No murmur heard. No BLE edema. Pulmonary/Chest: Effort normal and breath sounds normal. No respiratory  distress. Abdominal: Soft. Bowel sounds are normal, no distension. There is no tenderness. No masses. Musculoskeletal: Normal range of motion, no joint effusions. No gross deformities Neurological: Pt is alert and oriented to person, place, and time. No cranial nerve deficit. Coordination, balance, strength, speech and gait are normal.  Skin: Skin is warm and dry. No rash noted. No erythema.  Psychiatric: Patient has a normal mood and affect. behavior is normal. Judgment and thought content normal.  No results found for this or any previous visit (from the past 72 hour(s)).  PHQ2/9: Depression screen [facility] 2/9 08/01/2017 03/19/2017 12/20/2016 05/31/2016  10/07/2015  Decreased Interest 0 0 0 0 0  Down, Depressed, Hopeless 0 0 0 0 0  PHQ - 2 Score 0 0 0 0 0    Fall Risk: Fall Risk  11/07/2018 11/16/2017 08/01/2017 03/19/2017 12/20/2016  Falls in the past year? 0 No No No No  Number falls in past yr: 0 - - - -  Injury with Fall? 0 - - - -   Assessment & Plan  1. Hypertension, benign - Basic Metabolic Panel (BMET) - Take Lotensin 20mg  BID instead of taking 20mg  once daily to see if symptoms improve - BP check in 2 weeks as nurse visit.  2. Gastrointestinal hemorrhage, unspecified gastrointestinal hemorrhage type - CBC - Keep follow up with Dr. Servando SnareWohl on 11/26/2018, and keep follow up with Dr. Carlynn PurlSowles in February.  We will determine if restart of atorvastatin is safe after he sees Dr. Servando SnareWohl - I advised I would recommend statin therapy if cleared by GI due to recent CVA.  - Symptoms are resolving, vitals stable today.

## 2018-11-07 NOTE — Patient Instructions (Addendum)
Please take 1 tablet of Benazepril 20mg  twice daily. Check your blood pressure 3-4 times a week and bring a log to your next appointment.

## 2018-11-08 ENCOUNTER — Other Ambulatory Visit: Payer: Self-pay | Admitting: Family Medicine

## 2018-11-08 DIAGNOSIS — K625 Hemorrhage of anus and rectum: Secondary | ICD-10-CM

## 2018-11-08 DIAGNOSIS — D649 Anemia, unspecified: Secondary | ICD-10-CM

## 2018-11-08 LAB — CBC
HCT: 31.9 % — ABNORMAL LOW (ref 38.5–50.0)
Hemoglobin: 10.4 g/dL — ABNORMAL LOW (ref 13.2–17.1)
MCH: 27.9 pg (ref 27.0–33.0)
MCHC: 32.6 g/dL (ref 32.0–36.0)
MCV: 85.5 fL (ref 80.0–100.0)
MPV: 11.2 fL (ref 7.5–12.5)
Platelets: 389 10*3/uL (ref 140–400)
RBC: 3.73 10*6/uL — ABNORMAL LOW (ref 4.20–5.80)
RDW: 13.3 % (ref 11.0–15.0)
WBC: 4.5 10*3/uL (ref 3.8–10.8)

## 2018-11-08 LAB — BASIC METABOLIC PANEL
BUN: 9 mg/dL (ref 7–25)
CO2: 27 mmol/L (ref 20–32)
Calcium: 9 mg/dL (ref 8.6–10.3)
Chloride: 107 mmol/L (ref 98–110)
Creat: 1.01 mg/dL (ref 0.70–1.18)
GLUCOSE: 94 mg/dL (ref 65–99)
Potassium: 3.9 mmol/L (ref 3.5–5.3)
Sodium: 140 mmol/L (ref 135–146)

## 2018-11-11 ENCOUNTER — Other Ambulatory Visit: Payer: Self-pay | Admitting: Emergency Medicine

## 2018-11-11 DIAGNOSIS — D649 Anemia, unspecified: Secondary | ICD-10-CM

## 2018-11-11 DIAGNOSIS — K625 Hemorrhage of anus and rectum: Secondary | ICD-10-CM

## 2018-11-11 LAB — CBC
HCT: 32.3 % — ABNORMAL LOW (ref 38.5–50.0)
Hemoglobin: 10.4 g/dL — ABNORMAL LOW (ref 13.2–17.1)
MCH: 27.7 pg (ref 27.0–33.0)
MCHC: 32.2 g/dL (ref 32.0–36.0)
MCV: 86.1 fL (ref 80.0–100.0)
MPV: 10.8 fL (ref 7.5–12.5)
Platelets: 444 10*3/uL — ABNORMAL HIGH (ref 140–400)
RBC: 3.75 10*6/uL — ABNORMAL LOW (ref 4.20–5.80)
RDW: 13.3 % (ref 11.0–15.0)
WBC: 4.7 10*3/uL (ref 3.8–10.8)

## 2018-11-12 NOTE — Discharge Summary (Signed)
[facility] [place] [facility]   PATIENT NAME: Dennis Ford    MR#:  [id number]  DATE OF BIRTH:  1940/12/02  DATE OF ADMISSION:  10/28/2018 ADMITTING PHYSICIAN: Campbell StallKaty Dodd Mayo, MD  DATE OF DISCHARGE: 10/29/2018  2:25 PM  PRIMARY CARE PHYSICIAN: Alba CorySowles, Krichna, MD    ADMISSION DIAGNOSIS:  Cerebrovascular accident (CVA), unspecified mechanism (HCC) [I63.9] Hypertension, unspecified type [I10]  DISCHARGE DIAGNOSIS:  Active Problems:   Right hand weakness   SECONDARY DIAGNOSIS:   Past Medical History:  Diagnosis Date  . Diabetes mellitus without complication (HCC)   . Goiter   . History of inguinal hernia repair   . Hyperlipidemia   . Hypertension     HOSPITAL COURSE:   Right hand weakness-concern for TIA versus stroke.  CT head without any acute abnormalities. -MRI brain- negative. -Echo- Moderate LVH, no source of emboli. -Carotid Dopplers- < 50 % stenosis. -Lipid panel- LDL 80 and A1c- 5.4 -Continue aspirin 81 mg daily -Start Lipitor 40mg  -Neuro consult and PT/OT consult appreciated.  Hypertensive emergency- BP 229/127 in the ED. -Continue home benazepril -Hydralazine IV as needed  Type 2 diabetes- glucose normal in the ED. Not on any diabetes meds at home. -Checked A1c- 5.4 -Hold off on SSI for now  History of abnormal TSH- not on Synthroid at home. -Check TSH, T4- need to follow with PMD>  DISCHARGE CONDITIONS:   Stable.  CONSULTS OBTAINED:  Treatment Team:  Thana Farreynolds, Leslie, MD  DRUG ALLERGIES:   Allergies  Allergen Reactions  . Norvasc [Amlodipine] Other (See Comments)    angioedema    DISCHARGE MEDICATIONS:   Allergies as of 10/29/2018      Reactions   Norvasc [amlodipine] Other (See Comments)   angioedema      Medication List    TAKE these medications   allopurinol 300 MG tablet Commonly known as:  ZYLOPRIM TAKE 1 TABLET BY MOUTH EVERY DAY   benazepril 20 MG tablet Commonly known as:   LOTENSIN TAKE 2 TABLETS BY MOUTH EVERY DAY What changed:  how much to take   triamcinolone 0.025 % cream Commonly known as:  KENALOG Apply 1 application topically 2 (two) times daily. What changed:    when to take this  reasons to take this        DISCHARGE INSTRUCTIONS:    Follow with PMD in 1 week.  If you experience worsening of your admission symptoms, develop shortness of breath, life threatening emergency, suicidal or homicidal thoughts you must seek medical attention immediately by calling 911 or calling your MD immediately  if symptoms less severe.  You Must read complete instructions/literature along with all the possible adverse reactions/side effects for all the Medicines you take and that have been prescribed to you. Take any new Medicines after you have completely understood and accept all the possible adverse reactions/side effects.   Please note  You were cared for by a hospitalist during your hospital stay. If you have any questions about your discharge medications or the care you received while you were in the hospital after you are discharged, you can call the unit and asked to speak with the hospitalist on call if the hospitalist that took care of you is not available. Once you are discharged, your primary care physician will handle any further medical issues. Please note that NO REFILLS for any discharge medications will be authorized once you are discharged, as it is imperative that you return to your primary care physician (or  establish a relationship with a primary care physician if you do not have one) for your aftercare needs so that they can reassess your need for medications and monitor your lab values.    Today   CHIEF COMPLAINT:   Chief Complaint  Patient presents with  . Extremity Weakness  . Hypertension    HISTORY OF PRESENT ILLNESS:  Dennis Ford  is a 78 y.o. male with a known history of hypertension, hyperlipidemia, type 2 diabetes who  presented to the ED with right hand weakness that started last night.  He states he was watching TV when he all of a sudden noticed.  He could not grip anything.  He also noticed some numbness and tingling right arm and hand. Today, he noted that his right hand weakness was better, but still not back to normal.  He denies any numbness or tingling of his right arm and hand today.  No visual changes, no slurred speech, no confusion.  In the ED, BPs were elevated to 229/127.  Labs are unremarkable.  CT head negative for acute abnormalities.  He was given aspirin 324 mg p.o. x1.  Hospitalists were called for admission.    VITAL SIGNS:  Blood pressure (!) 172/94, pulse 79, temperature 98 F (36.7 C), temperature source Oral, resp. rate 16, height 5\' 8"  (1.727 m), weight 79.4 kg, SpO2 100 %.  I/O:  No intake or output data in the 24 hours ending 11/12/18 1956  PHYSICAL EXAMINATION:  GENERAL:  78 y.o.-year-old patient lying in the bed with no acute distress.  EYES: Pupils equal, round, reactive to light and accommodation. No scleral icterus. Extraocular muscles intact.  HEENT: Head atraumatic, normocephalic. Oropharynx and nasopharynx clear.  NECK:  Supple, no jugular venous distention. No thyroid enlargement, no tenderness.  LUNGS: Normal breath sounds bilaterally, no wheezing, rales,rhonchi or crepitation. No use of accessory muscles of respiration.  CARDIOVASCULAR: S1, S2 normal. No murmurs, rubs, or gallops.  ABDOMEN: Soft, non-tender, non-distended. Bowel sounds present. No organomegaly or mass.  EXTREMITIES: No pedal edema, cyanosis, or clubbing.  NEUROLOGIC: Cranial nerves II through XII are intact. Muscle strength 5/5 in all extremities. Sensation intact. Gait not checked.  PSYCHIATRIC: The patient is alert and oriented x 3.  SKIN: No obvious rash, lesion, or ulcer.   DATA REVIEW:   CBC Recent Labs  Lab 11/11/18 1118  WBC 4.7  HGB 10.4*  HCT 32.3*  PLT 444*    Chemistries   Recent Labs  Lab 11/07/18 1142  NA 140  K 3.9  CL 107  CO2 27  GLUCOSE 94  BUN 9  CREATININE 1.01  CALCIUM 9.0    Cardiac Enzymes No results for input(s): TROPONINI in the last 168 hours.  Microbiology Results  Results for orders placed or performed in visit on 03/19/17  Wound culture     Status: None   Collection Time: 03/19/17  3:25 PM  Result Value Ref Range Status   Culture STAPHYLOCOCCUS AUREUS  Final    Comment: SOURCE->PARONYCHIA; SOURCE: WOUND&WOUND   Gram Stain No WBC Seen  Final   Gram Stain No Squamous Epithelial Cells Seen  Final   Gram Stain Few Gram Positive Cocci In Pairs  Final    Comment: Rifampin and Gentamicin should not be used as single drugs for treatment of Staph infections.    Colony Count Abundant  Final   Organism ID, Bacteria STAPHYLOCOCCUS AUREUS  Final      Susceptibility   Staphylococcus aureus -  (no method  available)    OXACILLIN <=0.25 Sensitive     CEFAZOLIN  Sensitive     GENTAMICIN <=0.5 Sensitive     CIPROFLOXACIN <=0.5 Sensitive     LEVOFLOXACIN <=0.12 Sensitive     MOXIFLOXACIN <=0.25 Sensitive     TRIMETH/SULFA <=10 Sensitive     VANCOMYCIN 1 Sensitive     CLINDAMYCIN <=0.25 Sensitive     ERYTHROMYCIN <=0.25 Sensitive     LINEZOLID 1 Sensitive     QUINUPRISTIN/DALF <=0.25 Sensitive     RIFAMPIN <=0.5 Sensitive     TETRACYCLINE <=1 Sensitive     RADIOLOGY:  No results found.  EKG:   Orders placed or performed during the hospital encounter of 10/28/18  . EKG 12-Lead  . EKG 12-Lead  . ED EKG  . ED EKG  . EKG    Management plans discussed with the patient, family and they are in agreement.  CODE STATUS: full. Code Status History    Date Active Date Inactive Code Status Order ID Comments User Context   11/02/2018 0241 11/02/2018 1723 Full Code [id number]  Oralia Manis, MD Inpatient   10/28/2018 2039 10/29/2018 1731 Full Code [id number]  Mayo, Allyn Kenner, MD Inpatient      TOTAL TIME TAKING CARE OF THIS PATIENT:  35 minutes.    Altamese Dilling M.D on 11/12/2018 at 7:56 PM  Between 7am to 6pm - Pager - [phone]  After 6pm go to [email or web address] - Social research officer, government  [facility]  Office  [phone]  CC: Primary care physician; Alba Cory, MD   Note: This dictation was prepared with Dragon dictation along with smaller phrase technology. Any transcriptional errors that result from this process are unintentional.

## 2018-11-15 ENCOUNTER — Other Ambulatory Visit: Payer: Self-pay | Admitting: Nurse Practitioner

## 2018-11-15 DIAGNOSIS — I10 Essential (primary) hypertension: Secondary | ICD-10-CM

## 2018-11-26 ENCOUNTER — Ambulatory Visit: Payer: Medicare Other | Admitting: Gastroenterology

## 2018-12-10 ENCOUNTER — Encounter: Payer: Self-pay | Admitting: Family Medicine

## 2018-12-10 ENCOUNTER — Ambulatory Visit (INDEPENDENT_AMBULATORY_CARE_PROVIDER_SITE_OTHER): Payer: Medicare Other | Admitting: Family Medicine

## 2018-12-10 VITALS — BP 170/82 | HR 88 | Temp 98.3°F | Resp 16 | Ht 68.0 in | Wt 184.5 lb

## 2018-12-10 DIAGNOSIS — Z8673 Personal history of transient ischemic attack (TIA), and cerebral infarction without residual deficits: Secondary | ICD-10-CM | POA: Diagnosis not present

## 2018-12-10 DIAGNOSIS — E049 Nontoxic goiter, unspecified: Secondary | ICD-10-CM

## 2018-12-10 DIAGNOSIS — M109 Gout, unspecified: Secondary | ICD-10-CM

## 2018-12-10 DIAGNOSIS — R7989 Other specified abnormal findings of blood chemistry: Secondary | ICD-10-CM

## 2018-12-10 DIAGNOSIS — I10 Essential (primary) hypertension: Secondary | ICD-10-CM

## 2018-12-10 DIAGNOSIS — Z8719 Personal history of other diseases of the digestive system: Secondary | ICD-10-CM | POA: Diagnosis not present

## 2018-12-10 DIAGNOSIS — E8881 Metabolic syndrome: Secondary | ICD-10-CM

## 2018-12-10 DIAGNOSIS — Z91199 Patient's noncompliance with other medical treatment and regimen due to unspecified reason: Secondary | ICD-10-CM

## 2018-12-10 DIAGNOSIS — Z9119 Patient's noncompliance with other medical treatment and regimen: Secondary | ICD-10-CM

## 2018-12-10 MED ORDER — BENAZEPRIL HCL 10 MG PO TABS: 10.0000 mg | ORAL_TABLET | Freq: Two times a day (BID) | ORAL | 0 refills | Status: DC

## 2018-12-10 MED ORDER — ROSUVASTATIN CALCIUM 5 MG PO TABS: 5.0000 mg | ORAL_TABLET | Freq: Every day | ORAL | 0 refills | Status: DC

## 2018-12-10 NOTE — Progress Notes (Signed)
eferral Name: Dennis MinRobert Shoaf   MRN: [id number]    DOB: 1941-06-16   Date:12/10/2018       Progress Note  Subjective  Chief Complaint  Chief Complaint  Patient presents with  . Medication Refill  . Hypertension    States the Benazepril is giving him extreme dizziness and unable to walk when he takes 2 pills at once. SOB when taking 2 tablets  . Goiter    Never seen Endocrinologist  . GAD    HPI  Uncontrolled HTN: his last visit with me was one year ago, he developed right hand weakness [facility] and [facility] with diagnosis of CVA versus TIA, he was started on aspirin, atorvastatin and IV hydralazine for hypertensive urgency and continued on lisinopril. He was discharged home after multiple tests ( echo/carotid doppler/labs) . He developed rectal bleeding 3 days after he got home , [facility], but no colonoscopy, he was advised to stop aspirin, he also states told to stop statin therapy. We will resume statin therapy today.   Goiter:he has not been back to see endo, reviewed previous US and showed goiter, Last TSH was 27.116. We will recheck thyroid panel and refer him back to Endocrinologist if needed.   Gout: he states only takes allopurinol prn, explained that it is not the way the medication works and can make the flare worse, but he does not want to change   Hyperglycemia: [facility]C [facility] 5.4% in Dec     Patient Active Problem List   Diagnosis Date Noted  . History of TIAs 12/10/2018  . HLD (hyperlipidemia) 11/02/2018  . Hematochezia   . Intermittent low back pain 12/20/2016  . Hyperglycemia 06/01/2016  . Hypertension, benign 05/31/2016  . 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 Abnormal TSH 06/04/2015    Past Surgical History:  Procedure Laterality Date  . COLONOSCOPY    . CYST EXCISION Left 2008   Upper Back on Left Shoulder  . ESOPHAGOGASTRODUODENOSCOPY (EGD) WITH PROPOFOL N/A 11/02/2018   Procedure: ESOPHAGOGASTRODUODENOSCOPY (EGD) WITH  PROPOFOL;  Surgeon: Midge MiniumWohl, Darren, MD;  [facility];  Service: Endoscopy;  Laterality: N/A;  . HERNIA REPAIR     [facility]   . INGUINAL HERNIA REPAIR  11/27/2011   Procedure: HERNIA REPAIR INGUINAL ADULT;  Surgeon: Jetty DuhamelJames O Wyatt III, MD;  Location: [facility];  Service: General;  Laterality: Right;  right inguinal hernia repair with mesh    Family History  Problem Relation Age of Onset  . Breast cancer Mother     Social History   Socioeconomic History  . Marital status: Married    Spouse name: Not on file  . Number of children: Not on file  . Years of education: Not on file  . Highest education level: Not on file  Occupational History  . Not on file  Social Needs  . Financial resource strain: Not hard at all  . Food insecurity:    Worry: Never true    Inability: Never true  . Transportation needs:    Medical: Not on file    Non-medical: Not on file  Tobacco Use  . Smoking status: Former Smoker    Types: Cigars    Last attempt to quit: 11/22/1988    Years since quitting: 30.0  . Smokeless tobacco: Never Used  . Tobacco comment: Smokes off and on   Substance and Sexual Activity  . Alcohol use: No    Comment: he quit  10/2016  . Drug use: No  . Sexual activity: Yes    Partners: Female  Lifestyle  . Physical activity:    Days per week: Not on file    Minutes per session: Not on file  . Stress: Not on file  Relationships  . Social connections:    Talks on phone: Not on file    Gets together: Not on file    Attends religious service: Not on file    Active member of club or organization: Not on file    Attends meetings of clubs or organizations: Not on file    Relationship status: Not on file  . Intimate partner violence:    Fear of current or ex partner: Not on file    Emotionally abused: Not on file    Physically abused: Not on file    Forced sexual activity: Not on file  Other Topics Concern  . Not on file  Social History Narrative  . Not  on file     Current Outpatient Medications:  .  allopurinol (ZYLOPRIM) 300 MG tablet, TAKE 1 TABLET BY MOUTH EVERY DAY (Patient taking differently: Take 300 mg by mouth as needed. ), Disp: 90 tablet, Rfl: 1 .  benazepril (LOTENSIN) 10 MG tablet, Take 1-2 tablets (10-20 mg total) by mouth 2 (two) times daily. 2 in am and 1 in pm, Disp: 90 tablet, Rfl: 0 .  triamcinolone (KENALOG) 0.025 % cream, Apply 1 application topically 2 (two) times daily. (Patient taking differently: Apply 1 application topically 2 (two) times daily as needed (for inflammation/itching). ), Disp: 30 g, Rfl: 1 .  rosuvastatin (CRESTOR) 5 MG tablet, Take 1 tablet (5 mg total) by mouth daily., Disp: 30 tablet, Rfl: 0  Allergies  Allergen Reactions  . Norvasc [Amlodipine] Other (See Comments)    angioedema    I personally reviewed active problem list, medication list, allergies, family history, social history with the patient/caregiver today.   ROS  Constitutional: Negative for fever or weight change.  Respiratory: Negative for cough and shortness of breath.   Cardiovascular: Negative for chest pain or palpitations.  Gastrointestinal: Negative for abdominal pain, no bowel changes.  Musculoskeletal: Negative for gait problem or joint swelling.  Skin: Negative for rash.  Neurological: Negative for dizziness or headache.  No other specific complaints in a complete review of systems (except as listed in HPI above).   Objective  Vitals:   12/10/18 1502  BP: (!) 176/84  Pulse: 88  Resp: 16  Temp: 98.3 F (36.8 C)  TempSrc: Oral  SpO2: 99%  Weight: 184 lb 8 oz (83.7 kg)  Height: 5\' 8"  (1.727 m)    Body mass index is 28.05 kg/m.  Physical Exam  Constitutional: Patient appears well-developed and well-nourished. Overweight. No distress.  HEENT: head atraumatic, normocephalic, pupils equal and reactive to light,  neck supple, throat within normal limits, goiter Cardiovascular: Normal rate, regular rhythm and  normal heart sounds.  No murmur heard. No BLE edema. Pulmonary/Chest: Effort normal and breath sounds normal. No respiratory distress. Abdominal: Soft.  There is no tenderness. Psychiatric: Patient has a normal mood and affect. behavior is normal. Judgment and thought content normal.  PHQ2/9: Depression screen [facility] 12/10/2018 08/01/2017 03/19/2017 12/20/2016 05/31/2016  Decreased Interest 0 0 0 0 0  Down, Depressed, Hopeless 0 0 0 0 0  PHQ - 2 Score 0 0 0 0 0  Altered sleeping 0 - - - -  Tired, decreased energy 1 - - - -  Change in appetite 0 - - - -  Feeling bad or failure about yourself  0 - - - -  Trouble concentrating 0 - - - -  Moving slowly or fidgety/restless 0 - - - -  Suicidal thoughts 0 - - - -  PHQ-9 Score 1 - - - -  Difficult doing work/chores Somewhat difficult - - - -     Fall Risk: Fall Risk  12/10/2018 11/07/2018 11/16/2017 08/01/2017 03/19/2017  Falls in the past year? 0 0 No No No  Number falls in past yr: - 0 - - -  Injury with Fall? - 0 - - -     Functional Status Survey: Is the patient deaf or have difficulty hearing?: No Does the patient have difficulty seeing, even when wearing glasses/contacts?: No Does the patient have difficulty concentrating, remembering, or making decisions?: No Does the patient have difficulty walking or climbing stairs?: No Does the patient have difficulty dressing or bathing?: No Does the patient have difficulty doing errands alone such as visiting a doctor's office or shopping?: No    Assessment & Plan  1. Uncontrolled hypertension  - benazepril (LOTENSIN) 10 MG tablet; Take 1-2 tablets (10-20 mg total) by mouth 2 (two) times daily. 2 in am and 1 in pm  Dispense: 90 tablet; Refill: 0  2. Metabolic syndrome   3. Goiter diffuse  - Thyroid Panel With TSH  4. History of TIA (transient ischemic attack)  - rosuvastatin (CRESTOR) 5 MG tablet; Take 1 tablet (5 mg total) by mouth daily.  Dispense: 30 tablet; Refill: 0  5. History  of rectal bleeding   Off aspirin   6. Controlled gout  Not taking medication correctly   7. Non compliance with medical treatment  Discussed importance of compliance with medications and follow up  8. Elevated TSH  - Thyroid Panel With TSH

## 2018-12-11 ENCOUNTER — Other Ambulatory Visit: Payer: Self-pay | Admitting: Family Medicine

## 2018-12-11 LAB — THYROID PANEL WITH TSH
Free Thyroxine Index: 1 — ABNORMAL LOW (ref 1.4–3.8)
T3 Uptake: 30 % (ref 22–35)
T4, Total: 3.3 ug/dL — ABNORMAL LOW (ref 4.9–10.5)
TSH: 14 mIU/L — ABNORMAL HIGH (ref 0.40–4.50)

## 2018-12-11 MED ORDER — LEVOTHYROXINE SODIUM 25 MCG PO TABS: 25.0000 ug | ORAL_TABLET | Freq: Every day | ORAL | 1 refills | Status: DC

## 2018-12-12 ENCOUNTER — Other Ambulatory Visit: Payer: Self-pay

## 2018-12-12 DIAGNOSIS — I10 Essential (primary) hypertension: Secondary | ICD-10-CM

## 2018-12-12 MED ORDER — BENAZEPRIL HCL 20 MG PO TABS: ORAL_TABLET | ORAL | 0 refills | Status: DC

## 2018-12-12 NOTE — Telephone Encounter (Signed)
Insurance would not cover 10 mg 2 tablets in the morning and 1 at night. Had to change due to insurance coverage.

## 2019-01-07 ENCOUNTER — Other Ambulatory Visit: Payer: Self-pay | Admitting: Family Medicine

## 2019-01-07 DIAGNOSIS — Z8673 Personal history of transient ischemic attack (TIA), and cerebral infarction without residual deficits: Secondary | ICD-10-CM

## 2019-01-20 ENCOUNTER — Ambulatory Visit: Payer: Medicare Other | Admitting: Family Medicine

## 2019-01-31 ENCOUNTER — Other Ambulatory Visit: Payer: Self-pay | Admitting: Family Medicine

## 2019-01-31 NOTE — Telephone Encounter (Signed)
Refill request for thyroid medication.  Levothyroxine to [facility]  Last visit: 12/10/2018   Lab Results  Component Value Date   TSH 14.00 (H) 12/10/2018     Follow up on 02/10/2019

## 2019-02-03 NOTE — Telephone Encounter (Signed)
Pt has enough medication to last until 4.6.2020 (his next appt).

## 2019-02-10 ENCOUNTER — Ambulatory Visit (INDEPENDENT_AMBULATORY_CARE_PROVIDER_SITE_OTHER): Payer: Medicare Other | Admitting: Family Medicine

## 2019-02-10 ENCOUNTER — Encounter: Payer: Self-pay | Admitting: Family Medicine

## 2019-02-10 VITALS — BP 131/73

## 2019-02-10 DIAGNOSIS — I10 Essential (primary) hypertension: Secondary | ICD-10-CM | POA: Diagnosis not present

## 2019-02-10 DIAGNOSIS — M109 Gout, unspecified: Secondary | ICD-10-CM

## 2019-02-10 DIAGNOSIS — E039 Hypothyroidism, unspecified: Secondary | ICD-10-CM

## 2019-02-10 DIAGNOSIS — E8881 Metabolic syndrome: Secondary | ICD-10-CM

## 2019-02-10 DIAGNOSIS — L309 Dermatitis, unspecified: Secondary | ICD-10-CM | POA: Diagnosis not present

## 2019-02-10 DIAGNOSIS — E049 Nontoxic goiter, unspecified: Secondary | ICD-10-CM

## 2019-02-10 DIAGNOSIS — Z8673 Personal history of transient ischemic attack (TIA), and cerebral infarction without residual deficits: Secondary | ICD-10-CM | POA: Diagnosis not present

## 2019-02-10 DIAGNOSIS — D649 Anemia, unspecified: Secondary | ICD-10-CM

## 2019-02-10 MED ORDER — ROSUVASTATIN CALCIUM 5 MG PO TABS: 5.0000 mg | ORAL_TABLET | Freq: Every day | ORAL | 3 refills | Status: DC

## 2019-02-10 MED ORDER — TRIAMCINOLONE ACETONIDE 0.025 % EX CREA: 1.0000 "application " | TOPICAL_CREAM | Freq: Two times a day (BID) | CUTANEOUS | 0 refills | Status: DC | PRN

## 2019-02-10 MED ORDER — BENAZEPRIL HCL 20 MG PO TABS: ORAL_TABLET | ORAL | 0 refills | Status: DC

## 2019-02-10 NOTE — Progress Notes (Signed)
Name: Dennis Ford   MRN: [id number]    DOB: 1941-08-24   Date:02/10/2019       Progress Note  Subjective  Chief Complaint  Chief Complaint  Patient presents with  . Hypertension    I connected with@ on 02/10/19 at 10:40 AM EDT by telephone and verified that I am speaking with the correct person using two identifiers.  I discussed the limitations, risks, security and privacy concerns of performing an evaluation and management service by telephone and the availability of in person appointments. Staff also discussed with the patient that there may be a patient responsible charge related to this service. Patient Location: at home  [facility]   HPI   HTN: he lost to follow up last year, but since seen 12/2018 he has been taking medications and bp at home has been around 130's/70's , back in Nov 2019  he developed right hand weakness [facility] and was [facility] with diagnosis of CVA versus TIA, he was started on aspirin, atorvastatin and IV hydralazine for hypertensive urgency and continued on lisinopril. He was discharged home after multiple tests ( echo/carotid doppler/labs) . He developed rectal bleeding 3 days after he got home , [facility], but no colonoscopy, he was advised to stop aspirin because of lesions on gastric fundus compatible with recent bleed. He states second time he tries to take aspirin and has GI bleed   Goiter:he has not been back to see endo, reviewed previous US and showed goiter, Last TSH was down to 14, he needs to stop by for TSH level.  No dysphagia, bp is at goal, no change in bowel movements   Gout: he states only takes allopurinol prn, explained that it is not the way the medication works and can make the flare worse, but he does not want to change  , we will stop medication since not taking as prescribed   Hyperglycemia: [facility] was 5.4% in Dec 2019 , denies polyphagia, polydipsia   Patient Active  Problem List   Diagnosis Date Noted  . History of TIAs 12/10/2018  . HLD (hyperlipidemia) 11/02/2018  . Intermittent low back pain 12/20/2016  . Hyperglycemia 06/01/2016  . Hypertension, benign 05/31/2016  . 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 Abnormal TSH 06/04/2015    Past Surgical History:  Procedure Laterality Date  . COLONOSCOPY    . CYST EXCISION Left 2008   Upper Back on Left Shoulder  . ESOPHAGOGASTRODUODENOSCOPY (EGD) WITH PROPOFOL N/A 11/02/2018   Procedure: ESOPHAGOGASTRODUODENOSCOPY (EGD) WITH PROPOFOL;  Surgeon: Midge MiniumWohl, Darren, MD;  [facility];  Service: Endoscopy;  Laterality: N/A;  . HERNIA REPAIR     LIH   . INGUINAL HERNIA REPAIR  11/27/2011   Procedure: HERNIA REPAIR INGUINAL ADULT;  Surgeon: Jetty DuhamelJames O Wyatt III, MD;  Location: [facility];  Service: General;  Laterality: Right;  right inguinal hernia repair with mesh    Family History  Problem Relation Age of Onset  . Breast cancer Mother     Social History   Socioeconomic History  . Marital status: Married    Spouse name: Not on file  . Number of children: Not on file  . Years of education: Not on file  . Highest education level: Not on file  Occupational History  . Not on file  Social Needs  . Financial resource strain: Not hard at all  . Food insecurity:    Worry: Never true  Inability: Never true  . Transportation needs:    Medical: Not on file    Non-medical: Not on file  Tobacco Use  . Smoking status: Former Smoker    Types: Cigars    Last attempt to quit: 11/22/1988    Years since quitting: 30.2  . Smokeless tobacco: Never Used  . Tobacco comment: Smokes off and on   Substance and Sexual Activity  . Alcohol use: No    Comment: he quit 10/2016  . Drug use: No  . Sexual activity: Yes    Partners: Female  Lifestyle  . Physical activity:    Days per week: Not on file    Minutes per session: Not on file  . Stress: Not on file  Relationships  . Social connections:     Talks on phone: Not on file    Gets together: Not on file    Attends religious service: Not on file    Active member of club or organization: Not on file    Attends meetings of clubs or organizations: Not on file    Relationship status: Not on file  . Intimate partner violence:    Fear of current or ex partner: Not on file    Emotionally abused: Not on file    Physically abused: Not on file    Forced sexual activity: Not on file  Other Topics Concern  . Not on file  Social History Narrative  . Not on file     Current Outpatient Medications:  .  benazepril (LOTENSIN) 20 MG tablet, 1 in am and half in pm, Disp: 135 tablet, Rfl: 0 .  levothyroxine (SYNTHROID, LEVOTHROID) 25 MCG tablet, Take 1 tablet (25 mcg total) by mouth daily before breakfast., Disp: 30 tablet, Rfl: 1 .  triamcinolone (KENALOG) 0.025 % cream, Apply 1 application topically 2 (two) times daily as needed (for inflammation/itching)., Disp: 80 g, Rfl: 0 .  rosuvastatin (CRESTOR) 5 MG tablet, Take 1 tablet (5 mg total) by mouth daily., Disp: 30 tablet, Rfl: 3  Allergies  Allergen Reactions  . Aspirin     GI bleed   . Norvasc [Amlodipine] Other (See Comments)    angioedema    I personally reviewed active problem list, medication list, allergies, family history, social history with the patient/caregiver today.   ROS  Constitutional: Negative for fever or weight change.  Respiratory: Negative for cough and shortness of breath.   Cardiovascular: Negative for chest pain or palpitations.  Gastrointestinal: Negative for abdominal pain, no bowel changes.  Musculoskeletal: Negative for gait problem or joint swelling.  Skin: Negative for rash.  Neurological: Negative for dizziness or headache.  No other specific complaints in a complete review of systems (except as listed in HPI above).  Objective  Virtual encounter, vitals not obtained.  There is no height or weight on file to calculate BMI.  Physical Exam    Awake, alert, in no distress , strong voice   PHQ2/9: Depression screen [facility] 2/9 02/10/2019 12/10/2018 08/01/2017 03/19/2017 12/20/2016  Decreased Interest 0 0 0 0 0  Down, Depressed, Hopeless 0 0 0 0 0  PHQ - 2 Score 0 0 0 0 0  Altered sleeping 0 0 - - -  Tired, decreased energy 0 1 - - -  Change in appetite 0 0 - - -  Feeling bad or failure about yourself  0 0 - - -  Trouble concentrating 0 0 - - -  Moving slowly or fidgety/restless 0 0 - - -  Suicidal thoughts 0 0 - - -  PHQ-9 Score 0 1 - - -  Difficult doing work/chores - Somewhat difficult - - -   PHQ-2/9 Result is negative.    Fall Risk: Fall Risk  02/10/2019 12/10/2018 11/07/2018 11/16/2017 08/01/2017  Falls in the past year? 0 0 0 No No  Number falls in past yr: 0 - 0 - -  Injury with Fall? 0 - 0 - -     Assessment & Plan  1. History of TIA (transient ischemic attack)  - rosuvastatin (CRESTOR) 5 MG tablet; Take 1 tablet (5 mg total) by mouth daily.  Dispense: 30 tablet; Refill: 3  2. Dermatitis  - triamcinolone (KENALOG) 0.025 % cream; Apply 1 application topically 2 (two) times daily as needed (for inflammation/itching).  Dispense: 80 g; Refill: 0  3. Metabolic syndrome   4. Goiter  - TSH  5. Acquired hypothyroidism  - TSH  6. Hypertension, benign  - benazepril (LOTENSIN) 20 MG tablet; 1 in am and half in pm  Dispense: 135 tablet; Refill: 0  7. Controlled gout  Stop allopurinol since not compliant   8. Anemia, unspecified type  - CBC with Differential/Platelet - Iron, TIBC and Ferritin Panel    I discussed the assessment and treatment plan with the patient. The patient was provided an opportunity to ask questions and all were answered. The patient agreed with the plan and demonstrated an understanding of the instructions.   The patient was advised to call back or seek an in-person evaluation if the symptoms worsen or if the condition fails to improve as anticipated.  I provided 21 minutes of  non-face-to-face time during this encounter.  Ruel Favors, MD

## 2019-02-24 ENCOUNTER — Other Ambulatory Visit: Payer: Self-pay | Admitting: Family Medicine

## 2019-02-24 DIAGNOSIS — E039 Hypothyroidism, unspecified: Secondary | ICD-10-CM

## 2019-02-24 NOTE — Telephone Encounter (Signed)
Refill request for thyroid medication. [facility]othy[facility]  Last visit: 02/10/2019  Lab Results  Component Value Date   TSH 14.00 (H) 12/10/2018     No follow-ups on file.

## 2019-02-25 NOTE — Telephone Encounter (Signed)
Patient is really worried about coming in due to COVID-19. He only has a few pills left.

## 2019-02-28 NOTE — Telephone Encounter (Signed)
Patient state he can first thing in the morning Monday and would like to be taken straight back with a mask. He will call and let us know he is here. Please sign order and put up front. Will forward to the girls up front so they can know to expect this patient.

## 2019-03-03 ENCOUNTER — Other Ambulatory Visit: Payer: Self-pay

## 2019-03-03 DIAGNOSIS — E039 Hypothyroidism, unspecified: Secondary | ICD-10-CM

## 2019-03-03 LAB — TSH: TSH: 10.17 mIU/L — ABNORMAL HIGH (ref 0.40–4.50)

## 2019-03-04 ENCOUNTER — Other Ambulatory Visit: Payer: Self-pay | Admitting: Family Medicine

## 2019-03-04 DIAGNOSIS — E039 Hypothyroidism, unspecified: Secondary | ICD-10-CM

## 2019-03-04 MED ORDER — LEVOTHYROXINE SODIUM 50 MCG PO TABS: 50.0000 ug | ORAL_TABLET | Freq: Every day | ORAL | 1 refills | Status: DC

## 2019-04-15 ENCOUNTER — Encounter: Payer: Self-pay | Admitting: Family Medicine

## 2019-04-15 ENCOUNTER — Ambulatory Visit (INDEPENDENT_AMBULATORY_CARE_PROVIDER_SITE_OTHER): Payer: Medicare Other | Admitting: Family Medicine

## 2019-04-15 ENCOUNTER — Other Ambulatory Visit: Payer: Self-pay

## 2019-04-15 VITALS — BP 170/90 | HR 68 | Temp 97.8°F | Resp 14 | Ht 68.0 in | Wt 181.8 lb

## 2019-04-15 DIAGNOSIS — Z1839 Other retained organic fragments: Secondary | ICD-10-CM

## 2019-04-15 DIAGNOSIS — I10 Essential (primary) hypertension: Secondary | ICD-10-CM | POA: Diagnosis not present

## 2019-04-15 DIAGNOSIS — S1095XA Superficial foreign body of unspecified part of neck, initial encounter: Secondary | ICD-10-CM | POA: Diagnosis not present

## 2019-04-15 MED ORDER — DOXYCYCLINE HYCLATE 100 MG PO TABS: 100.0000 mg | ORAL_TABLET | Freq: Two times a day (BID) | ORAL | 0 refills | Status: AC

## 2019-04-15 NOTE — Progress Notes (Signed)
Name: Dennis Ford   MRN: [id number]    DOB: [birth year]   Date:04/15/2019       Progress Note  Subjective  Chief Complaint  Chief Complaint  Patient presents with  . Tick Removal    HPI  Pt presents with concern for embedded tick on the LEFT lower neck for about 3 days.  Endorses fatigue since noticing the tick, but no fevers/chills, rash, chest pain, shortness of breath, myalgias.  He has tried removal at home, but was unsuccessful.   Patient Active Problem List   Diagnosis Date Noted  . History of TIAs 12/10/2018  . HLD (hyperlipidemia) 11/02/2018  . Intermittent low back pain 12/20/2016  . Hyperglycemia 06/01/2016  . Hypertension, benign 05/31/2016  . 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 Abnormal TSH 06/04/2015    Social History   Tobacco Use  . Smoking status: Former Smoker    Types: Cigars    Last attempt to quit: 11/22/1988    Years since quitting: 30.4  . Smokeless tobacco: Never Used  . Tobacco comment: Smokes off and on   Substance Use Topics  . Alcohol use: No    Comment: he quit 10/2016     Current Outpatient Medications:  .  benazepril (LOTENSIN) 20 MG tablet, 1 in am and half in pm, Disp: 135 tablet, Rfl: 0 .  levothyroxine (SYNTHROID) 50 MCG tablet, Take 1 tablet (50 mcg total) by mouth daily before breakfast., Disp: 30 tablet, Rfl: 1 .  rosuvastatin (CRESTOR) 5 MG tablet, Take 1 tablet (5 mg total) by mouth daily., Disp: 30 tablet, Rfl: 3 .  triamcinolone (KENALOG) 0.025 % cream, Apply 1 application topically 2 (two) times daily as needed (for inflammation/itching)., Disp: 80 g, Rfl: 0 .  doxycycline (VIBRA-TABS) 100 MG tablet, Take 1 tablet (100 mg total) by mouth 2 (two) times daily for 14 days., Disp: 28 tablet, Rfl: 0  Allergies  Allergen Reactions  . Aspirin     GI bleed   . Norvasc [Amlodipine] Other (See Comments)    angioedema    I personally reviewed active problem list, medication list, allergies with the patient/caregiver today.  ROS   Ten systems reviewed and is negative except as mentioned in HPI  Objective  Vitals:   04/15/19 1025  BP: (!) 170/90  Pulse: 68  Resp: 14  Temp: 97.8 F (36.6 C)  TempSrc: Oral  SpO2: 95%  Weight: 181 lb 12.8 oz (82.5 kg)  Height: 5\' 8"  (1.727 m)    Body mass index is 27.64 kg/m.  Nursing Note and Vital Signs reviewed.  Physical Exam  Constitutional: Patient appears well-developed and well-nourished. Obese. No distress.  HEENT: head atraumatic, normocephalic Cardiovascular: Normal rate, regular rhythm and normal heart sounds.  No murmur heard. No BLE edema. Pulmonary/Chest: Effort normal and breath sounds clear bilaterally. No respiratory distress. Abdominal: Soft, bowel sounds normal, there is no tenderness, no HSM Psychiatric: Patient has a normal mood and affect. behavior is normal. Judgment and thought content normal. Skin: There is an engorged embedded tick on the LEFT lower neck anteriorly.   Procedure: Consent is obtained for removal.  Alcohol wipe is used to surround tick for several minutes, this does not cause the insect to back out on its own.  Small amount of heat cautery is applied to the base of the tick, which does cause the tick to remove itself from the patient's neck.  The tick did fall to the ground and burst upon impact, so evaluation of anatomy was  limited.  S/P removal, there is still a firmness to the site, so I strongly recommended he monitor the area, and if the firmness does not resolve over the course of a week, he will call and we will consider referral to general surgery to explore whether or not there is any retained insect.  No results found for this or any previous visit (from the past 72 hour(s)).  Assessment & Plan  1. Embedded tick of neck, initial encounter - The tick did fall to the ground and burst upon impact, so evaluation of anatomy was limited.  S/P removal, there is still a firmness to the site, so I strongly recommended he monitor the  area, and if the firmness does not resolve over the course of a week, he will call and we will consider referral to general surgery to explore whether or not there is any retained insect. - doxycycline (VIBRA-TABS) 100 MG tablet; Take 1 tablet (100 mg total) by mouth 2 (two) times daily for 14 days.  Dispense: 28 tablet; Refill: 0 - Due to his report of fatigue and prolonged exposure to embedded tick over 3 days, we will rx 2 week course of doxycycline.  Did advise to monitor skin for signs of secondary infection.  2. Hypertension, benign BP is elevated today - he has HTN, and is stressed due to having tick removal.  He will return in about 4 days for BP check nurse visit to ensure this is improving.  -Red flags and when to present for emergency care or RTC including fever >101.69F, chest pain, shortness of breath, new/worsening/un-resolving symptoms, reviewed with patient at time of visit. Follow up and care instructions discussed and provided in AVS.

## 2019-04-27 ENCOUNTER — Other Ambulatory Visit: Payer: Self-pay | Admitting: Family Medicine

## 2019-04-28 NOTE — Telephone Encounter (Signed)
Pt.notified

## 2019-05-07 DIAGNOSIS — E049 Nontoxic goiter, unspecified: Secondary | ICD-10-CM | POA: Diagnosis not present

## 2019-05-07 DIAGNOSIS — D649 Anemia, unspecified: Secondary | ICD-10-CM | POA: Diagnosis not present

## 2019-05-07 DIAGNOSIS — E039 Hypothyroidism, unspecified: Secondary | ICD-10-CM | POA: Diagnosis not present

## 2019-05-08 ENCOUNTER — Other Ambulatory Visit: Payer: Self-pay | Admitting: Family Medicine

## 2019-05-08 LAB — CBC WITH DIFFERENTIAL/PLATELET
Absolute Monocytes: 429 cells/uL (ref 200–950)
Basophils Absolute: 50 cells/uL (ref 0–200)
Basophils Relative: 1.5 %
Eosinophils Absolute: 50 cells/uL (ref 15–500)
Eosinophils Relative: 1.5 %
HCT: 41.9 % (ref 38.5–50.0)
Hemoglobin: 12.7 g/dL — ABNORMAL LOW (ref 13.2–17.1)
Lymphs Abs: 1465 cells/uL (ref 850–3900)
MCH: 24 pg — ABNORMAL LOW (ref 27.0–33.0)
MCHC: 30.3 g/dL — ABNORMAL LOW (ref 32.0–36.0)
MCV: 79.2 fL — ABNORMAL LOW (ref 80.0–100.0)
MPV: 11.1 fL (ref 7.5–12.5)
Monocytes Relative: 13 %
Neutro Abs: 1307 cells/uL — ABNORMAL LOW (ref 1500–7800)
Neutrophils Relative %: 39.6 %
Platelets: 339 10*3/uL (ref 140–400)
RBC: 5.29 10*6/uL (ref 4.20–5.80)
RDW: 17.8 % — ABNORMAL HIGH (ref 11.0–15.0)
Total Lymphocyte: 44.4 %
WBC: 3.3 10*3/uL — ABNORMAL LOW (ref 3.8–10.8)

## 2019-05-08 LAB — IRON,TIBC AND FERRITIN PANEL
%SAT: 9 % (calc) — ABNORMAL LOW (ref 20–48)
Ferritin: 12 ng/mL — ABNORMAL LOW (ref 24–380)
Iron: 32 ug/dL — ABNORMAL LOW (ref 50–180)
TIBC: 344 mcg/dL (calc) (ref 250–425)

## 2019-05-08 LAB — TSH: TSH: 5.74 mIU/L — ABNORMAL HIGH (ref 0.40–4.50)

## 2019-05-08 MED ORDER — LEVOTHYROXINE SODIUM 50 MCG PO TABS: 50.0000 ug | ORAL_TABLET | Freq: Every day | ORAL | 1 refills | Status: DC

## 2019-05-12 ENCOUNTER — Other Ambulatory Visit: Payer: Self-pay

## 2019-05-12 ENCOUNTER — Other Ambulatory Visit: Payer: Self-pay | Admitting: Family Medicine

## 2019-05-12 DIAGNOSIS — D649 Anemia, unspecified: Secondary | ICD-10-CM

## 2019-05-12 DIAGNOSIS — D708 Other neutropenia: Secondary | ICD-10-CM

## 2019-05-12 DIAGNOSIS — E039 Hypothyroidism, unspecified: Secondary | ICD-10-CM

## 2019-06-05 ENCOUNTER — Other Ambulatory Visit: Payer: Self-pay | Admitting: Family Medicine

## 2019-06-05 DIAGNOSIS — Z8673 Personal history of transient ischemic attack (TIA), and cerebral infarction without residual deficits: Secondary | ICD-10-CM

## 2019-06-10 ENCOUNTER — Other Ambulatory Visit: Payer: Self-pay | Admitting: Family Medicine

## 2019-06-10 DIAGNOSIS — I10 Essential (primary) hypertension: Secondary | ICD-10-CM

## 2019-07-03 ENCOUNTER — Other Ambulatory Visit: Payer: Self-pay

## 2019-07-03 MED ORDER — LEVOTHYROXINE SODIUM 50 MCG PO TABS: 50.0000 ug | ORAL_TABLET | Freq: Every day | ORAL | 0 refills | Status: DC

## 2019-07-03 NOTE — Telephone Encounter (Signed)
[facility] request for thyroid medication[facility]  Last visit: 04/15/2019   Lab Results  Component Value Date   TSH 5.74 (H) 05/07/2019     Follow up on 07/07/2019

## 2019-07-07 ENCOUNTER — Ambulatory Visit (INDEPENDENT_AMBULATORY_CARE_PROVIDER_SITE_OTHER): Payer: Medicare Other | Admitting: Family Medicine

## 2019-07-07 ENCOUNTER — Other Ambulatory Visit: Payer: Self-pay

## 2019-07-07 ENCOUNTER — Encounter: Payer: Self-pay | Admitting: Family Medicine

## 2019-07-07 DIAGNOSIS — D708 Other neutropenia: Secondary | ICD-10-CM | POA: Diagnosis not present

## 2019-07-07 DIAGNOSIS — E039 Hypothyroidism, unspecified: Secondary | ICD-10-CM

## 2019-07-07 DIAGNOSIS — D509 Iron deficiency anemia, unspecified: Secondary | ICD-10-CM | POA: Diagnosis not present

## 2019-07-07 DIAGNOSIS — E8881 Metabolic syndrome: Secondary | ICD-10-CM

## 2019-07-07 DIAGNOSIS — I10 Essential (primary) hypertension: Secondary | ICD-10-CM | POA: Diagnosis not present

## 2019-07-07 DIAGNOSIS — Z8673 Personal history of transient ischemic attack (TIA), and cerebral infarction without residual deficits: Secondary | ICD-10-CM

## 2019-07-07 NOTE — Progress Notes (Signed)
Name: Dennis Ford   MRN: [id number]    DOB: 08/09/1941   Date:07/07/2019       Progress Note  Subjective  Chief Complaint  Chief Complaint  Patient presents with  . Medication Refill  . Hypertension    Denies any symptoms  . Goiter  . Hyperglycemia  . Gout    I connected with  Dennis Ford on 07/07/19 at  9:40 AM EDT by telephone and verified that I am speaking with the correct person using two identifiers.  I discussed the limitations, risks, security and privacy concerns of performing an evaluation and management service by telephone and the availability of in person appointments. Staff also discussed with the patient that there may be a patient responsible charge related to this service. Patient Location: at home  [facility]   HPI  WUJ:[id number]:since he was seen 12/2018 he has been taking medications and bp at home was elevated today but per patient usually normal when batteries is not low, he will get more batteries , back in Nov 2019  he developed right hand weakness [facility] and [facility] with diagnosis of CVA versus TIA, he was started on aspirin, atorvastatin and IV hydralazine for hypertensive urgency and continued on lisinopril. He was discharged home after multiple tests ( echo/carotid doppler/labs) . He developed rectal bleeding 3 days after he got home , [facility] and had EGD, but no colonoscopy, he was advised to stop aspirin because of lesions on gastric fundus compatible with recent bleed. He has been off Atorvastatin and also Crestor he states it causes upset stomach when he takes statin therapy. He is also off aspirin because of GI bleed  Goiter:he has not been back to see endo, reviewed previous US and showed goiter, Last tSH was still elevated and advised him to return for TSH level. He denies fatigue or dysphagia.   Gout: he states only takes allopurinol prn, explained that it is not the way the medication works and can  make the flare worse, but he does not want to change  , we will stop medication since not taking as prescribed   Hyperglycemia: [facility] 5.4% in Dec2019 , denies polyphagia, polydipsia or polyuria   Iron deficiency anemia: he had GI bleed in Nov 2019, we will recheck levels, also get hemoccult stools , he states he does not want to have colonoscopy or see GI at this time  Patient Active Problem List   Diagnosis Date Noted  . Iron deficiency anemia 07/07/2019  . History of TIAs 12/10/2018  . HLD (hyperlipidemia) 11/02/2018  . Intermittent low back pain 12/20/2016  . Hyperglycemia 06/01/2016  . Hypertension, benign 05/31/2016  . 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 Abnormal TSH 06/04/2015    Past Surgical History:  Procedure Laterality Date  . COLONOSCOPY    . CYST EXCISION Left 2008   Upper Back on Left Shoulder  . ESOPHAGOGASTRODUODENOSCOPY (EGD) WITH PROPOFOL N/A 11/02/2018   Procedure: ESOPHAGOGASTRODUODENOSCOPY (EGD) WITH PROPOFOL;  Surgeon: Midge MiniumWohl, Darren, MD;  [facility];  Service: Endoscopy;  Laterality: N/A;  . HERNIA REPAIR     LIH   . INGUINAL HERNIA REPAIR  11/27/2011   Procedure: HERNIA REPAIR INGUINAL ADULT;  Surgeon: Jetty DuhamelJames O Wyatt III, MD;  Location: [facility];  Service: General;  Laterality: Right;  right inguinal hernia repair with mesh    Family History  Problem Relation Age of Onset  . Breast cancer Mother  Social History   Socioeconomic History  . Marital status: Married    Spouse name: Not on file  . Number of children: 2  . Years of education: Not on file  . Highest education level: 10th grade  Occupational History  . Occupation: retired  Engineer, production  . Financial resource strain: Not hard at all  . Food insecurity    Worry: Never true    Inability: Never true  . Transportation needs    Medical: No    Non-medical: No  Tobacco Use  . Smoking status: Former Smoker    Types: Cigars    Quit date: 11/22/1988     Years since quitting: 30.6  . Smokeless tobacco: Never Used  . Tobacco comment: Smokes off and on   Substance and Sexual Activity  . Alcohol use: No    Comment: he quit 10/2016  . Drug use: No  . Sexual activity: Yes    Partners: Female  Lifestyle  . Physical activity    Days per week: 7 days    Minutes per session: 30 Ford  . Stress: Not at all  Relationships  . Social Musician on phone: Not on file    Gets together: Not on file    Attends religious service: Not on file    Active member of club or organization: Not on file    Attends meetings of clubs or organizations: Not on file    Relationship status: Not on file  . Intimate partner violence    Fear of current or ex partner: No    Emotionally abused: No    Physically abused: No    Forced sexual activity: No  Other Topics Concern  . Not on file  Social History Narrative  . Not on file     Current Outpatient Medications:  .  benazepril (LOTENSIN) 20 MG tablet, TAKE 1 TABLET IN MORNING AND HALF IN EVENING, Disp: 135 tablet, Rfl: 0 .  levothyroxine (SYNTHROID) 50 MCG tablet, Take 1-2 tablets (50-100 mcg total) by mouth daily before breakfast. One daily and two on Sundays, Disp: 34 tablet, Rfl: 0 .  triamcinolone (KENALOG) 0.025 % cream, Apply 1 application topically 2 (two) times daily as needed (for inflammation/itching)., Disp: 80 g, Rfl: 0  Allergies  Allergen Reactions  . Aspirin     GI bleed   . Norvasc [Amlodipine] Other (See Comments)    angioedema  . Statins     Per patient GI bleed and upset stomach     I personally reviewed active problem list, medication list, allergies, family history, social history, health maintenance with the patient/caregiver today.   ROS  Ten systems reviewed and is negative except as mentioned in HPI   Objective  Virtual encounter, vitals not obtained.  There is no height or weight on file to calculate BMI.  Physical Exam  Awake, alert and oriented   PHQ2/9: Depression screen [facility] 2/9 07/07/2019 04/15/2019 02/10/2019 12/10/2018 08/01/2017  Decreased Interest 0 0 0 0 0  Down, Depressed, Hopeless 0 0 0 0 0  PHQ - 2 Score 0 0 0 0 0  Altered sleeping 0 0 0 0 -  Tired, decreased energy 0 0 0 1 -  Change in appetite 0 0 0 0 -  Feeling bad or failure about yourself  0 0 0 0 -  Trouble concentrating 0 0 0 0 -  Moving slowly or fidgety/restless 0 0 0 0 -  Suicidal thoughts 0 0 0 0 -  PHQ-9 Score 0 0 0 1 -  Difficult doing work/chores Not difficult at all - - Somewhat difficult -   PHQ-2/9 Result is negative.    Fall Risk: Fall Risk  07/07/2019 04/15/2019 02/10/2019 12/10/2018 11/07/2018  Falls in the past year? 0 0 0 0 0  Number falls in past yr: 0 0 0 - 0  Injury with Fall? 0 0 0 - 0     Assessment & Plan  1. Iron deficiency anemia, unspecified iron deficiency anemia type  - CBC with Differential/Platelet - Iron, TIBC and Ferritin Panel  2. History of TIA (transient ischemic attack)   3. Other neutropenia (HCC)  CBC  4. Hypertension, benign  - COMPLETE METABOLIC PANEL WITH GFR  5. Acquired hypothyroidism  - TSH  6. Metabolic syndrome   I discussed the assessment and treatment plan with the patient. The patient was provided an opportunity to ask questions and all were answered. The patient agreed with the plan and demonstrated an understanding of the instructions.   The patient was advised to call back or seek an in-person evaluation if the symptoms worsen or if the condition fails to improve as anticipated.  I provided 25 minutes of non-face-to-face time during this encounter.  Loistine Chance, MD

## 2019-07-09 ENCOUNTER — Other Ambulatory Visit: Payer: Self-pay

## 2019-07-09 ENCOUNTER — Ambulatory Visit: Payer: Medicare Other | Admitting: Family Medicine

## 2019-07-09 ENCOUNTER — Ambulatory Visit (INDEPENDENT_AMBULATORY_CARE_PROVIDER_SITE_OTHER): Payer: Medicare Other | Admitting: Family Medicine

## 2019-07-09 VITALS — BP 170/87 | HR 90 | Temp 97.7°F | Resp 16 | Ht 68.0 in | Wt 178.7 lb

## 2019-07-09 DIAGNOSIS — E039 Hypothyroidism, unspecified: Secondary | ICD-10-CM | POA: Diagnosis not present

## 2019-07-09 DIAGNOSIS — W57XXXD Bitten or stung by nonvenomous insect and other nonvenomous arthropods, subsequent encounter: Secondary | ICD-10-CM | POA: Diagnosis not present

## 2019-07-09 DIAGNOSIS — R5383 Other fatigue: Secondary | ICD-10-CM | POA: Diagnosis not present

## 2019-07-09 DIAGNOSIS — Z8739 Personal history of other diseases of the musculoskeletal system and connective tissue: Secondary | ICD-10-CM

## 2019-07-09 DIAGNOSIS — D509 Iron deficiency anemia, unspecified: Secondary | ICD-10-CM | POA: Diagnosis not present

## 2019-07-09 DIAGNOSIS — I10 Essential (primary) hypertension: Secondary | ICD-10-CM

## 2019-07-09 DIAGNOSIS — Z8673 Personal history of transient ischemic attack (TIA), and cerebral infarction without residual deficits: Secondary | ICD-10-CM | POA: Diagnosis not present

## 2019-07-09 MED ORDER — CHLORTHALIDONE 25 MG PO TABS: 12.5000 mg | ORAL_TABLET | Freq: Every day | ORAL | 0 refills | Status: DC

## 2019-07-09 NOTE — Progress Notes (Signed)
Name: Dennis Ford   MRN: [id number]    DOB: 05-26-1941   Date:07/09/2019       Progress Note  Subjective  Chief Complaint  Chief Complaint  Patient presents with  . Blood Pressure Check  . Hypertension    Complains of visual disturbances and headaches. BP is elevated in the mornings as soon as he wakes up.     HPI  HTN: he used to take norvasc but stopped years ago because developed angioedema years ago, he was also given carvedilol and hydralazine but he stops when he feels like it is not working, he is on benazepril for many years but bp is very high. He states Benazepril is the only that seems to work,however explained it is not working and we will need to add another medication. He is wiling to try chlorthalidone. He has a remote history of gout and we will check uric acid today. No neuro deficit, he wakes up with headache, sometimes wakes up with mild blurred vision. Advised to see eye doctor, we need to also bring bp down   Other fatigue: history of tick bite a few months ago, he states he still has induration on his skin, states neck seems tight since, wakes up with headaches every am and feels tired. We will check lyme titer   Patient Active Problem List   Diagnosis Date Noted  . Iron deficiency anemia 07/07/2019  . History of TIAs 12/10/2018  . HLD (hyperlipidemia) 11/02/2018  . Intermittent low back pain 12/20/2016  . Hyperglycemia 06/01/2016  . Hypertension, benign 05/31/2016  . 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 Abnormal TSH 06/04/2015    Past Surgical History:  Procedure Laterality Date  . COLONOSCOPY    . CYST EXCISION Left 2008   Upper Back on Left Shoulder  . ESOPHAGOGASTRODUODENOSCOPY (EGD) WITH PROPOFOL N/A 11/02/2018   Procedure: ESOPHAGOGASTRODUODENOSCOPY (EGD) WITH PROPOFOL;  Surgeon: Midge Minium, MD;  Location: [facility];  Service: Endoscopy;  Laterality: N/A;  . HERNIA REPAIR     LI[facility]   . INGUINAL HERNIA REPAIR  11/27/2011   Procedure: HERNIA REPAIR  INGUINAL ADULT;  Surgeon: Jetty Duhamel, MD;  Location: [facility];  Service: General;  Laterality: Right;  right inguinal hernia repair with mesh    Family History  Problem Relation Age of Onset  . Breast cancer Mother     Social History   Socioeconomic History  . Marital status: Married    Spouse name: Not on file  . Number of children: 2  . Years of education: Not on file  . Highest education level: 10th grade  Occupational History  . Occupation: retired  Engineer, production  . Financial resource strain: Not hard at all  . Food insecurity    Worry: Never true    Inability: Never true  . Transportation needs    Medical: No    Non-medical: No  Tobacco Use  . Smoking status: Former Smoker    Types: Cigars    Quit date: 11/22/1988    Years since quitting: 30.6  . Smokeless tobacco: Never Used  . Tobacco comment: Smokes off and on   Substance and Sexual Activity  . Alcohol use: No    Comment: he quit 10/2016  . Drug use: No  . Sexual activity: Yes    Partners: Female  Lifestyle  . Physical activity    Days per week: 7 days    Minutes per session: 30 min  . Stress: Not at all  Relationships  . Social Musicianconnections    Talks on phone: Not on file    Gets together: Not on file    Attends religious service: Not on file    Active member of club or organization: Not on file    Attends meetings of clubs or organizations: Not on file    Relationship status: Not on file  . Intimate partner violence    Fear of current or ex partner: No    Emotionally abused: No    Physically abused: No    Forced sexual activity: No  Other Topics Concern  . Not on file  Social History Narrative  . Not on file     Current Outpatient Medications:  .  benazepril (LOTENSIN) 20 MG tablet, TAKE 1 TABLET IN MORNING AND HALF IN EVENING, Disp: 135 tablet, Rfl: 0 .  levothyroxine (SYNTHROID) 50 MCG tablet, Take 1-2 tablets (50-100 mcg total) by mouth daily before breakfast. One  daily and two on Sundays, Disp: 34 tablet, Rfl: 0 .  triamcinolone (KENALOG) 0.025 % cream, Apply 1 application topically 2 (two) times daily as needed (for inflammation/itching)., Disp: 80 g, Rfl: 0  Allergies  Allergen Reactions  . Aspirin     GI bleed   . Norvasc [Amlodipine] Other (See Comments)    angioedema  . Statins     Per patient GI bleed and upset stomach     I personally reviewed active problem list, medication list, allergies, family history, social history with the patient/caregiver today.   ROS  Constitutional: Negative for fever or weight change.  Respiratory: Negative for cough and shortness of breath.   Cardiovascular: Negative for chest pain or palpitations. he feels indigestion at times in am, and pain on surgical scar on his back  Gastrointestinal: Negative for abdominal pain, no bowel changes.  Musculoskeletal: Negative for gait problem or joint swelling.  Skin: Negative for rash.  Neurological: Negative for dizziness , positive for morning  headache.  No other specific complaints in a complete review of systems (except as listed in HPI above).   Objective  Vitals:   07/09/19 1125 07/09/19 1132 07/09/19 1302  BP: (!) 170/90 (!) 200/100 (!) 170/87  Pulse: 85 90   Resp: 16    Temp: 97.7 F (36.5 C)    TempSrc: Temporal    SpO2: 99%    Weight: 178 lb 11.2 oz (81.1 kg)    Height: 5\' 8"  (1.727 m)      Body mass index is 27.17 kg/m.  Physical Exam  Constitutional: Patient appears well-developed and well-nourished.  No distress.  HEENT: head atraumatic, normocephalic, pupils equal and reactive to light,  neck supple, induration on the site of tick bite on left side, non tender  Cardiovascular: Normal rate, regular rhythm and normal heart sounds.  No murmur heard. No BLE edema. Pulmonary/Chest: Effort normal and breath sounds normal. No respiratory distress. Abdominal: Soft.  There is no tenderness. Psychiatric: Patient has a normal mood and affect.  behavior is normal. Judgment and thought content normal.  Recent Results (from the past 2160 hour(s))  CBC with Differential/Platelet     Status: Abnormal   Collection Time: 05/07/19 11:18 AM  Result Value Ref Range   WBC 3.3 (L) 3.8 - 10.8 Thousand/uL   RBC 5.29 4.20 - 5.80 Million/uL   Hemoglobin 12.7 (L) 13.2 - 17.1 g/dL   HCT 16.141.9 09.638.5 - 04.550.0 %   MCV 79.2 (L) 80.0 - 100.0 fL   MCH 24.0 (L) 27.0 -  33.0 pg   MCHC 30.3 (L) 32.0 - 36.0 g/dL   RDW 17.8 (H) 11.0 - 15.0 %   Platelets 339 140 - 400 Thousand/uL   MPV 11.1 7.5 - 12.5 fL   Neutro Abs 1,307 (L) 1,500 - 7,800 cells/uL   Lymphs Abs 1,465 850 - 3,900 cells/uL   Absolute Monocytes 429 200 - 950 cells/uL   Eosinophils Absolute 50 15 - 500 cells/uL   Basophils Absolute 50 0 - 200 cells/uL   Neutrophils Relative % 39.6 %   Total Lymphocyte 44.4 %   Monocytes Relative 13.0 %   Eosinophils Relative 1.5 %   Basophils Relative 1.5 %  Iron, TIBC and Ferritin Panel     Status: Abnormal   Collection Time: 05/07/19 11:18 AM  Result Value Ref Range   Iron 32 (L) 50 - 180 mcg/dL   TIBC 344 250 - 425 mcg/dL (calc)   %SAT 9 (L) 20 - 48 % (calc)   Ferritin 12 (L) 24 - 380 ng/mL  TSH     Status: Abnormal   Collection Time: 05/07/19 11:18 AM  Result Value Ref Range   TSH 5.74 (H) 0.40 - 4.50 mIU/L      PHQ2/9: Depression screen [facility] 2/9 07/09/2019 07/07/2019 04/15/2019 02/10/2019 12/10/2018  Decreased Interest 0 0 0 0 0  Down, Depressed, Hopeless 0 0 0 0 0  PHQ - 2 Score 0 0 0 0 0  Altered sleeping 0 0 0 0 0  Tired, decreased energy 0 0 0 0 1  Change in appetite 0 0 0 0 0  Feeling bad or failure about yourself  0 0 0 0 0  Trouble concentrating 0 0 0 0 0  Moving slowly or fidgety/restless 0 0 0 0 0  Suicidal thoughts 0 0 0 0 0  PHQ-9 Score 0 0 0 0 1  Difficult doing work/chores - Not difficult at all - - Somewhat difficult    phq 9 is negative   Fall Risk: Fall Risk  07/09/2019 07/07/2019 04/15/2019 02/10/2019 12/10/2018  Falls in the  past year? 0 0 0 0 0  Number falls in past yr: 0 0 0 0 -  Injury with Fall? 0 0 0 0 -     Functional Status Survey: Is the patient deaf or have difficulty hearing?: No Does the patient have difficulty seeing, even when wearing glasses/contacts?: No Does the patient have difficulty concentrating, remembering, or making decisions?: No Does the patient have difficulty walking or climbing stairs?: No Does the patient have difficulty dressing or bathing?: No Does the patient have difficulty doing errands alone such as visiting a doctor's office or shopping?: No    Assessment & Plan  1. Uncontrolled hypertension  - chlorthalidone (HYGROTON) 25 MG tablet; Take 0.5-1 tablets (12.5-25 mg total) by mouth daily.  Dispense: 30 tablet; Refill: 0 - Microalbumin / creatinine urine ratio  2. History of TIA (transient ischemic attack)  Stopped statin therapy   3. Other fatigue  Check labs  4. Tick bite, subsequent encounter  - B. burgdorfi antibodies  5. History of gout  - Uric acid

## 2019-07-09 NOTE — Addendum Note (Signed)
Addended by: Steele Sizer F on: 07/09/2019 01:15 PM   Modules accepted: Orders, Level of Service

## 2019-07-09 NOTE — Progress Notes (Signed)
Patient is here for a blood pressure check. Patient denies chest pain, palpitations, shortness of breath or visual disturbances. Previous visit was done virtually so no blood pressure was or heart rate was obtained. He was seen on 04/15/2019 and his bp was 170/90. Today during nurse visit first check blood pressure was 170/90 with a heart rate of 85. After resting for 10 minutes it was 200/100 and heart rate was 90. He does take blood pressure medications as prescribed. He complains of headaches and visual disturbances in the mornings when he first wakes up. He also would like to know if these symptoms that he is having is related to the tick bite he had on 04/15/2019.

## 2019-07-10 DIAGNOSIS — I10 Essential (primary) hypertension: Secondary | ICD-10-CM | POA: Diagnosis not present

## 2019-07-10 LAB — CBC WITH DIFFERENTIAL/PLATELET
Absolute Monocytes: 471 cells/uL (ref 200–950)
Basophils Absolute: 40 cells/uL (ref 0–200)
Basophils Relative: 0.9 %
Eosinophils Absolute: 92 cells/uL (ref 15–500)
Eosinophils Relative: 2.1 %
HCT: 45.5 % (ref 38.5–50.0)
Hemoglobin: 13.8 g/dL (ref 13.2–17.1)
Lymphs Abs: 1760 cells/uL (ref 850–3900)
MCH: 24.4 pg — ABNORMAL LOW (ref 27.0–33.0)
MCHC: 30.3 g/dL — ABNORMAL LOW (ref 32.0–36.0)
MCV: 80.4 fL (ref 80.0–100.0)
MPV: 10.7 fL (ref 7.5–12.5)
Monocytes Relative: 10.7 %
Neutro Abs: 2037 cells/uL (ref 1500–7800)
Neutrophils Relative %: 46.3 %
Platelets: 308 10*3/uL (ref 140–400)
RBC: 5.66 10*6/uL (ref 4.20–5.80)
RDW: 15.5 % — ABNORMAL HIGH (ref 11.0–15.0)
Total Lymphocyte: 40 %
WBC: 4.4 10*3/uL (ref 3.8–10.8)

## 2019-07-10 LAB — URIC ACID: Uric Acid, Serum: 8.3 mg/dL — ABNORMAL HIGH (ref 4.0–8.0)

## 2019-07-10 LAB — COMPLETE METABOLIC PANEL WITH GFR
AG Ratio: 1.3 (calc) (ref 1.0–2.5)
ALT: 10 U/L (ref 9–46)
AST: 15 U/L (ref 10–35)
Albumin: 4.2 g/dL (ref 3.6–5.1)
Alkaline phosphatase (APISO): 47 U/L (ref 35–144)
BUN: 13 mg/dL (ref 7–25)
CO2: 25 mmol/L (ref 20–32)
Calcium: 9.3 mg/dL (ref 8.6–10.3)
Chloride: 105 mmol/L (ref 98–110)
Creat: 1.11 mg/dL (ref 0.70–1.18)
GFR, Est African American: 73 mL/min/{1.73_m2} (ref >=60)
GFR, Est Non African American: 63 mL/min/{1.73_m2} (ref >=60)
Globulin: 3.3 g/dL (calc) (ref 1.9–3.7)
Glucose, Bld: 91 mg/dL (ref 65–99)
Potassium: 3.9 mmol/L (ref 3.5–5.3)
Sodium: 138 mmol/L (ref 135–146)
Total Bilirubin: 0.5 mg/dL (ref 0.2–1.2)
Total Protein: 7.5 g/dL (ref 6.1–8.1)

## 2019-07-10 LAB — IRON,TIBC AND FERRITIN PANEL
%SAT: 17 % (calc) — ABNORMAL LOW (ref 20–48)
Ferritin: 15 ng/mL — ABNORMAL LOW (ref 24–380)
Iron: 60 ug/dL (ref 50–180)
TIBC: 353 mcg/dL (calc) (ref 250–425)

## 2019-07-10 LAB — TSH: TSH: 7.57 mIU/L — ABNORMAL HIGH (ref 0.40–4.50)

## 2019-07-10 LAB — TEST AUTHORIZATION

## 2019-07-11 ENCOUNTER — Other Ambulatory Visit: Payer: Self-pay | Admitting: Family Medicine

## 2019-07-11 DIAGNOSIS — E039 Hypothyroidism, unspecified: Secondary | ICD-10-CM

## 2019-07-11 LAB — MICROALBUMIN / CREATININE URINE RATIO
Creatinine, Urine: 191 mg/dL (ref 20–320)
Microalb Creat Ratio: 15 mcg/mg creat (ref <30)
Microalb, Ur: 2.8 mg/dL

## 2019-07-11 MED ORDER — LEVOTHYROXINE SODIUM 75 MCG PO TABS: 75.0000 ug | ORAL_TABLET | Freq: Every day | ORAL | 0 refills | Status: DC

## 2019-07-11 MED ORDER — ALLOPURINOL 100 MG PO TABS: 100.0000 mg | ORAL_TABLET | Freq: Every day | ORAL | 6 refills | Status: DC

## 2019-07-29 ENCOUNTER — Other Ambulatory Visit: Payer: Self-pay | Admitting: Family Medicine

## 2019-07-29 NOTE — Telephone Encounter (Signed)
Requested medication (s) are due for refill today: yes  Requested medication (s) are on the active medication list: yes  Last refill:  07/03/2019  Future visit scheduled: yes  Notes to clinic:  Review for refill   Requested Prescriptions  Pending Prescriptions Disp Refills   levothyroxine (SYNTHROID) 50 MCG tablet [Pharmacy Med Name: LEVOTHYROXINE 50 MCG TABLET] 34 tablet 0    Sig: Take 1-2 tablets (50-100 mcg total) by mouth daily before breakfast. One daily and two on Sundays     Endocrinology:  Hypothyroid Agents Failed - 07/29/2019  1:29 AM      Failed - TSH needs to be rechecked within 3 months after an abnormal result. Refill until TSH is due.      Failed - TSH in normal range and within 360 days    TSH  Date Value Ref Range Status  07/09/2019 7.57 (H) 0.40 - 4.50 mIU/L Final         Passed - Valid encounter within last 12 months    Recent Outpatient Visits          2 weeks ago Uncontrolled hypertension   [facility] [place], Drue Stager, MD   3 weeks ago History of TIA (transient ischemic attack)   [facility] Steele Sizer, MD   3 months ago Embedded tick of neck, initial encounter   [place], FNP   5 months ago Hypertension, benign   [facility] Steele Sizer, MD   7 months ago Uncontrolled hypertension   [facility] Steele Sizer, MD      Future Appointments            In 2 months An[facility] Boozer, Drue Stager, MD [facility], [facility]

## 2019-08-01 ENCOUNTER — Other Ambulatory Visit: Payer: Self-pay | Admitting: Family Medicine

## 2019-08-01 DIAGNOSIS — I10 Essential (primary) hypertension: Secondary | ICD-10-CM

## 2019-08-07 ENCOUNTER — Other Ambulatory Visit: Payer: Self-pay | Admitting: Family Medicine

## 2019-08-07 ENCOUNTER — Ambulatory Visit (INDEPENDENT_AMBULATORY_CARE_PROVIDER_SITE_OTHER): Payer: Medicare Other

## 2019-08-07 VITALS — Ht 68.0 in | Wt 179.0 lb

## 2019-08-07 DIAGNOSIS — Z0000 Encounter for general adult medical examination without abnormal findings: Secondary | ICD-10-CM

## 2019-08-07 DIAGNOSIS — E039 Hypothyroidism, unspecified: Secondary | ICD-10-CM

## 2019-08-07 NOTE — Telephone Encounter (Signed)
Requested medication (s) are due for refill today: yes  Requested medication (s) are on the active medication list: yes  Last refill: 07/11/2019  Future visit scheduled: yes  Notes to clinic:  Review for refill   Requested Prescriptions  Pending Prescriptions Disp Refills   levothyroxine (SYNTHROID) 75 MCG tablet [Pharmacy Med Name: LEVOTHYROXINE 75 MCG TABLET] 30 tablet 0    Sig: TAKE 1 TABLET (75 MCG TOTAL) BY MOUTH DAILY BEFORE BREAKFAST. ONE DAILY AND HALF ON SUNDAYS     Endocrinology:  Hypothyroid Agents Failed - 08/07/2019  1:32 AM      Failed - TSH needs to be rechecked within 3 months after an abnormal result. Refill until TSH is due.      Failed - TSH in normal range and within 360 days    TSH  Date Value Ref Range Status  07/09/2019 7.57 (H) 0.40 - 4.50 mIU/L Final         Passed - Valid encounter within last 12 months    Recent Outpatient Visits          4 weeks ago Uncontrolled hypertension   [facility] [place], Drue Stager, MD   1 month ago History of TIA (transient ischemic attack)   [facility] Steele Sizer, MD   3 months ago Embedded tick of neck, initial encounter   [place], FNP   5 months ago Hypertension, benign   [facility] Steele Sizer, MD   8 months ago Uncontrolled hypertension   [facility] Steele Sizer, MD      Future Appointments            Today  [facility], [place]   In 2 months Steele Sizer, MD [facility], [facility]

## 2019-08-07 NOTE — Patient Instructions (Signed)
[name] , Thank you for taking time to come for your Medicare Wellness Visit. I appreciate your ongoing commitment to your health goals. Please review the following plan we discussed and let me know if I can assist you in the future.   Screening recommendations/referrals: Recommended yearly ophthalmology/optometry visit for glaucoma screening and checkup Recommended yearly dental visit for hygiene and checkup  Vaccinations: Influenza vaccine: postponed Pneumococcal vaccine: done 05/31/16 Tdap vaccine: done 10/16/13 Shingles vaccine: Shingrix discussed. Please contact your pharmacy for coverage information.   Advanced directives: Advance directive discussed with you today. I have provided a copy for you to complete at home and have notarized. Once this is complete please bring a copy in to our office so we can scan it into your chart.  Conditions/risks identified: Recommend healthy eating and physical activity to lower blood pressure  Next appointment: Please follow up in one year for your Medicare Annual Wellness visit.    Preventive Care 50 Years and Older, Male Preventive care refers to lifestyle choices and visits with your health care provider that can promote health and wellness. What does preventive care include?  A yearly physical exam. This is also called an annual well check.  Dental exams once or twice a year.  Routine eye exams. Ask your health care provider how often you should have your eyes checked.  Personal lifestyle choices, including:  Daily care of your teeth and gums.  Regular physical activity.  Eating a healthy diet.  Avoiding tobacco and drug use.  Limiting alcohol use.  Practicing safe sex.  Taking low doses of aspirin every day.  Taking vitamin and mineral supplements as recommended by your health care provider. What happens during an annual well check? The services and screenings done by your health care provider during your annual well  check will depend on your age, overall health, lifestyle risk factors, and family history of disease. Counseling  Your health care provider may ask you questions about your:  Alcohol use.  Tobacco use.  Drug use.  Emotional well-being.  Home and relationship well-being.  Sexual activity.  Eating habits.  History of falls.  Memory and ability to understand (cognition).  Work and work Statistician. Screening  You may have the following tests or measurements:  Height, weight, and BMI.  Blood pressure.  Lipid and cholesterol levels. These may be checked every 5 years, or more frequently if you are over 4 years old.  Skin check.  Lung cancer screening. You may have this screening every year starting at age 64 if you have a 30-pack-year history of smoking and currently smoke or have quit within the past 15 years.  Fecal occult blood test (FOBT) of the stool. You may have this test every year starting at age 50.  Flexible sigmoidoscopy or colonoscopy. You may have a sigmoidoscopy every 5 years or a colonoscopy every 10 years starting at age 31.  Prostate cancer screening. Recommendations will vary depending on your family history and other risks.  Hepatitis C blood test.  Hepatitis B blood test.  Sexually transmitted disease (STD) testing.  Diabetes screening. This is done by checking your blood sugar (glucose) after you have not eaten for a while (fasting). You may have this done every 1-3 years.  Abdominal aortic aneurysm (AAA) screening. You may need this if you are a current or former smoker.  Osteoporosis. You may be screened starting at age 49 if you are at high risk. Talk with your health care provider about your  test results, treatment options, and if necessary, the need for more tests. Vaccines  Your health care provider may recommend certain vaccines, such as:  Influenza vaccine. This is recommended every year.  Tetanus, diphtheria, and acellular  pertussis (Tdap, Td) vaccine. You may need a Td booster every 10 years.  Zoster vaccine. You may need this after age 27.  Pneumococcal 13-valent conjugate (PCV13) vaccine. One dose is recommended after age 37.  Pneumococcal polysaccharide (PPSV23) vaccine. One dose is recommended after age 23. Talk to your health care provider about which screenings and vaccines you need and how often you need them. This information is not intended to replace advice given to you by your health care provider. Make sure you discuss any questions you have with your health care provider. Document Released: 11/19/2015 Document Revised: 07/12/2016 Document Reviewed: 08/24/2015 Elsevier Interactive Patient Education  2017 [place] Prevention in the Home Falls can cause injuries. They can happen to people of all ages. There are many things you can do to make your home safe and to help prevent falls. What can I do on the outside of my home?  Regularly fix the edges of walkways and driveways and fix any cracks.  Remove anything that might make you trip as you walk through a door, such as a raised step or threshold.  Trim any bushes or trees on the path to your home.  Use bright outdoor lighting.  Clear any walking paths of anything that might make someone trip, such as rocks or tools.  Regularly check to see if handrails are loose or broken. Make sure that both sides of any steps have handrails.  Any raised decks and porches should have guardrails on the edges.  Have any leaves, snow, or ice cleared regularly.  Use sand or salt on walking paths during winter.  Clean up any spills in your garage right away. This includes oil or grease spills. What can I do in the bathroom?  Use night lights.  Install grab bars by the toilet and in the tub and shower. Do not use towel bars as grab bars.  Use non-skid mats or decals in the tub or shower.  If you need to sit down in the shower, use a plastic,  non-slip stool.  Keep the floor dry. Clean up any water that spills on the floor as soon as it happens.  Remove soap buildup in the tub or shower regularly.  Attach bath mats securely with double-sided non-slip rug tape.  Do not have throw rugs and other things on the floor that can make you trip. What can I do in the bedroom?  Use night lights.  Make sure that you have a light by your bed that is easy to reach.  Do not use any sheets or blankets that are too big for your bed. They should not hang down onto the floor.  Have a firm chair that has side arms. You can use this for support while you get dressed.  Do not have throw rugs and other things on the floor that can make you trip. What can I do in the kitchen?  Clean up any spills right away.  Avoid walking on wet floors.  Keep items that you use a lot in easy-to-reach places.  If you need to reach something above you, use a strong step stool that has a grab bar.  Keep electrical cords out of the way.  Do not use floor polish or wax that  makes floors slippery. If you must use wax, use non-skid floor wax.  Do not have throw rugs and other things on the floor that can make you trip. What can I do with my stairs?  Do not leave any items on the stairs.  Make sure that there are handrails on both sides of the stairs and use them. Fix handrails that are broken or loose. Make sure that handrails are as long as the stairways.  Check any carpeting to make sure that it is firmly attached to the stairs. Fix any carpet that is loose or worn.  Avoid having throw rugs at the top or bottom of the stairs. If you do have throw rugs, attach them to the floor with carpet tape.  Make sure that you have a light switch at the top of the stairs and the bottom of the stairs. If you do not have them, ask someone to add them for you. What else can I do to help prevent falls?  Wear shoes that:  Do not have high heels.  Have rubber  bottoms.  Are comfortable and fit you well.  Are closed at the toe. Do not wear sandals.  If you use a stepladder:  Make sure that it is fully opened. Do not climb a closed stepladder.  Make sure that both sides of the stepladder are locked into place.  Ask someone to hold it for you, if possible.  Clearly mark and make sure that you can see:  Any grab bars or handrails.  First and last steps.  Where the edge of each step is.  Use tools that help you move around (mobility aids) if they are needed. These include:  Canes.  Walkers.  Scooters.  Crutches.  Turn on the lights when you go into a dark area. Replace any light bulbs as soon as they burn out.  Set up your furniture so you have a clear path. Avoid moving your furniture around.  If any of your floors are uneven, fix them.  If there are any pets around you, be aware of where they are.  Review your medicines with your doctor. Some medicines can make you feel dizzy. This can increase your chance of falling. Ask your doctor what other things that you can do to help prevent falls. This information is not intended to replace advice given to you by your health care provider. Make sure you discuss any questions you have with your health care provider. Document Released: 08/19/2009 Document Revised: 03/30/2016 Document Reviewed: 11/27/2014 Elsevier Interactive Patient Education  2017 [employer].

## 2019-08-07 NOTE — Progress Notes (Signed)
Subjective:   Dennis Ford is a 78 y.o. male who presents for an Initial Medicare Annual Wellness Visit.  Virtual Visit via Telephone Note  I connected with Dennis Ford on 08/07/19 at  8:00 AM EDT by telephone and verified that I am speaking with the correct person using two identifiers.  Medicare Annual Wellness visit completed telephonically due to Covid-19 pandemic.   Location: Patient: home Provider: office   I discussed the limitations, risks, security and privacy concerns of performing an evaluation and management service by telephone and the availability of in person appointments. The patient expressed understanding and agreed to proceed.  Some vital signs may be absent or patient reported.   Clemetine Marker, LPN    Review of Systems   Cardiac Risk Factors include: advanced age (>65men, >76 women);hypertension;male gender;dyslipidemia    Objective:    Today's Vitals   08/07/19 0819  Weight: 179 lb (81.2 kg)  Height: 5\' 8"  (1.727 m)   Body mass index is 27.22 kg/m.  Advanced Directives 08/07/2019 11/02/2018 11/01/2018 10/29/2018 08/01/2017 05/04/2017 03/19/2017  Does Patient Have a Medical Advance Directive? No No No No No No No  Would patient like information on creating a medical advance directive? Yes (MAU/Ambulatory/Procedural Areas - Information given) No - Patient declined - No - Patient declined - - -    Current Medications (verified) Outpatient Encounter Medications as of 08/07/2019  Medication Sig  . allopurinol (ZYLOPRIM) 100 MG tablet Take 1 tablet (100 mg total) by mouth daily.  . benazepril (LOTENSIN) 20 MG tablet TAKE 1 TABLET IN MORNING AND HALF IN EVENING  . chlorthalidone (HYGROTON) 25 MG tablet TAKE 0.5-1 TABLETS (12.5-25 MG TOTAL) BY MOUTH DAILY.  Marland Kitchen levothyroxine (SYNTHROID) 75 MCG tablet Take 1 tablet (75 mcg total) by mouth daily before breakfast. One daily and half on Sundays  . triamcinolone (KENALOG) 0.025 % cream Apply 1 application  topically 2 (two) times daily as needed (for inflammation/itching). (Patient not taking: Reported on 08/07/2019)   No facility-administered encounter medications on file as of 08/07/2019.     Allergies (verified) Aspirin, Norvasc [amlodipine], and Statins   History: Past Medical History:  Diagnosis Date  . Diabetes mellitus without complication ([place])   . Goiter   . History of inguinal hernia repair   . Hyperlipidemia   . Hypertension    Past Surgical History:  Procedure Laterality Date  . COLONOSCOPY    . CYST EXCISION Left 2008   Upper Back on Left Shoulder  . ESOPHAGOGASTRODUODENOSCOPY (EGD) WITH PROPOFOL N/A 11/02/2018   Procedure: ESOPHAGOGASTRODUODENOSCOPY (EGD) WITH PROPOFOL;  Surgeon: Lucilla Lame, MD;  Location: [facility] ENDOSCOPY;  Service: Endoscopy;  Laterality: N/A;  . HERNIA REPAIR     LIH   . INGUINAL HERNIA REPAIR  11/27/2011   Procedure: HERNIA REPAIR INGUINAL ADULT;  Surgeon: Belva Crome, MD;  Location: [facility];  Service: General;  Laterality: Right;  right inguinal hernia repair with mesh   Family History  Problem Relation Age of Onset  . Breast cancer Mother    Social History   Socioeconomic History  . Marital status: Married    Spouse name: Not on file  . Number of children: 2  . Years of education: Not on file  . Highest education level: 10th grade  Occupational History  . Occupation: retired  Scientific laboratory technician  . Financial resource strain: Not hard at all  . Food insecurity    Worry: Never true    Inability: Never true  .  Transportation needs    Medical: No    Non-medical: No  Tobacco Use  . Smoking status: Former Smoker    Types: Cigars    Quit date: 11/22/1988    Years since quitting: 30.7  . Smokeless tobacco: Never Used  Substance and Sexual Activity  . Alcohol use: No    Comment: he quit 10/2016  . Drug use: No  . Sexual activity: Yes    Partners: Female  Lifestyle  . Physical activity    Days per week: 7 days     Minutes per session: 30 min  . Stress: Not at all  Relationships  . Social Musicianconnections    Talks on phone: Patient refused    Gets together: Patient refused    Attends religious service: Patient refused    Active member of club or organization: Patient refused    Attends meetings of clubs or organizations: Patient refused    Relationship status: Married  Other Topics Concern  . Not on file  Social History Narrative  . Not on file   Tobacco Counseling Counseling given: Not Answered   Clinical Intake:  Pre-visit preparation completed: Yes  Pain : No/denies pain     BMI - recorded: 27.22 Nutritional Status: BMI 25 -29 Overweight Nutritional Risks: None Diabetes: No  How often do you need to have someone help you when you read instructions, pamphlets, or other written materials from your doctor or pharmacy?: 1 - Never  Interpreter Needed?: No  Information entered by :: Reather LittlerKasey Latrail Pounders LPN  Activities of Daily Living In your present state of health, do you have any difficulty performing the following activities: 08/07/2019 07/09/2019  Hearing? N N  Comment declines hearing aids -  Vision? N N  Difficulty concentrating or making decisions? N N  Walking or climbing stairs? N N  Dressing or bathing? N N  Doing errands, shopping? N N  Preparing Food and eating ? N -  Using the Toilet? N -  In the past six months, have you accidently leaked urine? N -  Do you have problems with loss of bowel control? N -  Managing your Medications? N -  Managing your Finances? N -  Housekeeping or managing your Housekeeping? N -  Some recent data might be hidden     Immunizations and Health Maintenance Immunization History  Administered Date(s) Administered  . Pneumococcal Conjugate-13 05/31/2016, 05/31/2016  . Pneumococcal Polysaccharide-23 01/31/2013  . Tdap 10/16/2013  . Zoster 07/09/2008   There are no preventive care reminders to display for this patient.  Patient Care Team:  Alba CorySowles, Krichna, MD as PCP - General (Family Medicine)  Indicate any recent Medical Services you may have received from other than [facility] providers in the past year (date may be approximate).    Assessment:   This is a routine wellness examination for Dennis MaduroRobert.  Hearing/Vision screen  Hearing Screening   125Hz  250Hz  500Hz  1000Hz  2000Hz  3000Hz  4000Hz  6000Hz  8000Hz   Right ear:           Left ear:           Comments: Pt denies hearing difficulty  Vision Screening Comments: Pt past due for eye exam. Established with [facility]  Dietary issues and exercise activities discussed: Current Exercise Habits: Home exercise routine, Type of exercise: walking;Other - see comments(yard work), Time (Minutes): 30, Frequency (Times/Week): 7, Weekly Exercise (Minutes/Week): 210, Intensity: Mild, Exercise limited by: None identified  Goals    . Patient Stated     Pt would  like to maintain healthy blood pressure.       Depression Screen PHQ 2/9 Scores 08/07/2019 07/09/2019 07/07/2019 04/15/2019  PHQ - 2 Score 0 0 0 0  PHQ- 9 Score - 0 0 0    Fall Risk Fall Risk  08/07/2019 07/09/2019 07/07/2019 04/15/2019 02/10/2019  Falls in the past year? 0 0 0 0 0  Number falls in past yr: 0 0 0 0 0  Injury with Fall? 0 0 0 0 0  Follow up Falls prevention discussed - - - -   FALL RISK PREVENTION PERTAINING TO THE HOME:  Any stairs in or around the home? No  If so, do they handrails? No   Home free of loose throw rugs in walkways, pet beds, electrical cords, etc? Yes  Adequate lighting in your home to reduce risk of falls? Yes   ASSISTIVE DEVICES UTILIZED TO PREVENT FALLS:  Life alert? No  Use of a cane, walker or w/c? No  Grab bars in the bathroom? Yes  Shower chair or bench in shower? No  Elevated toilet seat or a handicapped toilet? No   DME ORDERS:  DME order needed?  No   TIMED UP AND GO:  Was the test performed? No . Telephonic visit.   Education: Fall risk prevention has been discussed.  Intervention(s)  required? No   Cognitive Function:     6CIT Screen 08/07/2019  What Year? 0 points  What month? 0 points  What time? 0 points  Count back from 20 0 points  Months in reverse 0 points  Repeat phrase 6 points  Total Score 6    Screening Tests Health Maintenance  Topic Date Due  . INFLUENZA VACCINE  02/04/2020 (Originally 06/07/2019)  . TETANUS/TDAP  08/15/2029 (Originally 10/17/2023)  . PNA vac Low Risk Adult  Completed    Qualifies for Shingles Vaccine? Yes  Zostavax completed 2009. Due for Shingrix. Education has been provided regarding the importance of this vaccine. Pt has been advised to call insurance company to determine out of pocket expense. Advised may also receive vaccine at local pharmacy or Health Dept. Verbalized acceptance and understanding.  Tdap: Up to date  Flu Vaccine: Due for Flu vaccine. Does the patient want to receive this vaccine today?  No . Education has been provided regarding the importance of this vaccine but still declined. Advised may receive this vaccine at local pharmacy or Health Dept. Aware to provide a copy of the vaccination record if obtained from local pharmacy or Health Dept. Verbalized acceptance and understanding.  Pneumococcal Vaccine: Up to date  Cancer Screenings:  Colorectal Screening: No longer required.   Lung Cancer Screening: (Low Dose CT Chest recommended if Age 52-80 years, 30 pack-year currently smoking OR have quit w/in 15years.) does not qualify.   Additional Screening:  Hepatitis C Screening: no longer required  Vision Screening: Recommended annual ophthalmology exams for early detection of glaucoma and other disorders of the eye. Is the patient up to date with their annual eye exam?  No  Who is the provider or what is the name of the office in which the pt attends annual eye exams? [facility]  Dental Screening: Recommended annual dental exams for proper oral hygiene  Community Resource Referral:  CRR required this  visit?  No       Plan:    I have personally reviewed and addressed the Medicare Annual Wellness questionnaire and have noted the following in the patient's chart:  A. Medical and social history B.  Use of alcohol, tobacco or illicit drugs  C. Current medications and supplements D. Functional ability and status E.  Nutritional status F.  Physical activity G. Advance directives H. List of other physicians I.  Hospitalizations, surgeries, and ER visits in previous 12 months J.  Vitals K. Screenings such as hearing and vision if needed, cognitive and depression L. Referrals and appointments   In addition, I have reviewed and discussed with patient certain preventive protocols, quality metrics, and best practice recommendations. A written personalized care plan for preventive services as well as general preventive health recommendations were provided to patient.   Signed,  Reather Littler, LPN Nurse Health Advisor   Nurse Notes: pt doing well and appreciative of visit today

## 2019-08-29 ENCOUNTER — Other Ambulatory Visit: Payer: Self-pay | Admitting: Family Medicine

## 2019-08-29 DIAGNOSIS — I10 Essential (primary) hypertension: Secondary | ICD-10-CM | POA: Diagnosis not present

## 2019-08-31 ENCOUNTER — Other Ambulatory Visit: Payer: Self-pay | Admitting: Family Medicine

## 2019-08-31 MED ORDER — ALLOPURINOL 100 MG PO TABS: 100.0000 mg | ORAL_TABLET | Freq: Two times a day (BID) | ORAL | 6 refills | Status: DC

## 2019-09-01 LAB — URIC ACID: Uric Acid, Serum: 9.4 mg/dL — ABNORMAL HIGH (ref 4.0–8.0)

## 2019-09-01 LAB — B. BURGDORFI ANTIBODIES: B burgdorferi Ab IgG+IgM: <0.9 index

## 2019-09-04 ENCOUNTER — Telehealth: Payer: Self-pay | Admitting: Family Medicine

## 2019-09-04 NOTE — Telephone Encounter (Signed)
Pt calling with concerns he is running out of levothyroxine (SYNTHROID) 75 MCG tablet  And wants to know if this is what he should be taking 75MG  ?  If pt is to take additional 1/2 tab  75 mg on Sundays, he was only given 30 tabs. Or the 145mcg of levothyroxine on Sundays?   Also requesting chlorthalidone (HYGROTON) 25 MG tablet  Can he have refills or a 90 day supply?  Pt states he is doing fine.  [id number] [address], [place] [phone] (Phone) [phone] (Fax)

## 2019-09-05 ENCOUNTER — Other Ambulatory Visit: Payer: Self-pay

## 2019-09-05 DIAGNOSIS — E039 Hypothyroidism, unspecified: Secondary | ICD-10-CM

## 2019-09-05 DIAGNOSIS — I10 Essential (primary) hypertension: Secondary | ICD-10-CM

## 2019-09-05 MED ORDER — CHLORTHALIDONE 25 MG PO TABS: 25.0000 mg | ORAL_TABLET | Freq: Every day | ORAL | 0 refills | Status: DC

## 2019-09-05 MED ORDER — LEVOTHYROXINE SODIUM 75 MCG PO TABS: 75.0000 ug | ORAL_TABLET | Freq: Every day | ORAL | 0 refills | Status: DC

## 2019-09-05 NOTE — Telephone Encounter (Signed)
Called patient. He is aware about how to take his medication. I told him he needs his TSH checked. He will come by next week and get labs and I will go over his medication again.

## 2019-09-09 ENCOUNTER — Other Ambulatory Visit: Payer: Self-pay | Admitting: Family Medicine

## 2019-09-09 DIAGNOSIS — I10 Essential (primary) hypertension: Secondary | ICD-10-CM

## 2019-09-10 ENCOUNTER — Telehealth: Payer: Self-pay | Admitting: Family Medicine

## 2019-09-10 NOTE — Chronic Care Management (AMB) (Signed)
°  Chronic Care Management   Outreach Note  09/10/2019 Name: Dennis Ford MRN: [id number] DOB: 07-11-1941  Referred by: Steele Sizer, MD Reason for referral : No chief complaint on file.   An unsuccessful telephone outreach was attempted today. The patient was referred to the case management team by for assistance with care management and care coordination.   Follow Up Plan: The care management team will reach out to the patient again over the next 7 days.  If patient returns call to provider office, please advise to call [place] at [place]  [email or web address]   ??[phone]

## 2019-09-22 NOTE — Chronic Care Management (AMB) (Signed)
Chronic Care Management   Note  09/22/2019 Name: Dennis Ford MRN: [id number] DOB: 02-Apr-1941  Dennis Ford is a 78 y.o. year old male who is a primary care patient of Steele Sizer, MD. I reached out to Gwynneth Macleod by phone today in response to a referral sent by Dennis Ford's health plan.     Dennis Ford was given information about Chronic Care Management services today including:  1. CCM service includes personalized support from designated clinical staff supervised by his physician, including individualized plan of care and coordination with other care providers 2. 24/7 contact phone numbers for assistance for urgent and routine care needs. 3. Service will only be billed when office clinical staff spend 20 minutes or more in a month to coordinate care. 4. Only one practitioner may furnish and bill the service in a calendar month. 5. The patient may stop CCM services at any time (effective at the end of the month) by phone call to the office staff. 6. The patient will be responsible for cost sharing (co-pay) of up to 20% of the service fee (after annual deductible is met).  Patient did not agree to enrollment in care management services and does not wish to consider at this time.  Follow up plan: The patient has been provided with contact information for the chronic care management team and has been advised to call with any health related questions or concerns.   [place], [place] [phone] Direct Dial: [email or web address]  Website: [email or web address]

## 2019-09-24 ENCOUNTER — Other Ambulatory Visit: Payer: Self-pay

## 2019-09-24 NOTE — Patient Outreach (Signed)
[facility]) Care Management  09/24/2019  Dennis Ford 1941-08-07 [id number]   Medication Adherence call to Dennis Ford Compliant Voice message left with a call back number. Dennis Ford is showing past due on Rosuvastatin 5 mg under [place].   [place] Management Direct Dial [phone]  Fax [phone] [email or web address]

## 2019-10-07 ENCOUNTER — Ambulatory Visit: Payer: Medicare Other | Admitting: Family Medicine

## 2019-10-08 ENCOUNTER — Other Ambulatory Visit: Payer: Self-pay

## 2019-10-08 NOTE — Patient Outreach (Signed)
[facility]) Care Management  10/08/2019  Kalan Yeley [birth year] [id number]   Medication Adherence call to Mr. Beaver Compliant Voice message left with a call back number. Mr. Caselli is showing past due on Rosuvastatin 5 mg under [place].   [place] Management Direct Dial [phone]  Fax [phone] [email or web address]

## 2019-12-01 ENCOUNTER — Other Ambulatory Visit: Payer: Self-pay | Admitting: Family Medicine

## 2019-12-01 DIAGNOSIS — I10 Essential (primary) hypertension: Secondary | ICD-10-CM

## 2019-12-01 DIAGNOSIS — E039 Hypothyroidism, unspecified: Secondary | ICD-10-CM

## 2020-02-27 ENCOUNTER — Other Ambulatory Visit: Payer: Self-pay | Admitting: Family Medicine

## 2020-02-27 DIAGNOSIS — E039 Hypothyroidism, unspecified: Secondary | ICD-10-CM

## 2020-02-27 DIAGNOSIS — I10 Essential (primary) hypertension: Secondary | ICD-10-CM

## 2020-02-27 NOTE — Telephone Encounter (Signed)
Requested  medications are  due for refill today [place], Levothyroxin  Requested medications are on the active medication list yes, Levothyroxin dose changed in between appts and lab work  Last refill 1/25  Future visit scheduled yes in October  Notes to clinic Failed protocol due to no visit within 6 months, next visit not until Oct

## 2020-03-01 NOTE — Telephone Encounter (Signed)
lvm for pt to return call to sch appt. Lm informing that prescription has been sent to pharmacy

## 2020-03-17 ENCOUNTER — Other Ambulatory Visit: Payer: Self-pay | Admitting: Family Medicine

## 2020-03-17 DIAGNOSIS — I10 Essential (primary) hypertension: Secondary | ICD-10-CM

## 2020-03-17 NOTE — Telephone Encounter (Signed)
Pt is scheduled °

## 2020-03-17 NOTE — Telephone Encounter (Signed)
Requested medications are due for refill today?  Yes  Requested medications are on active medication list?  Yes  Last Refill:   09/09/2019  # 135 with no refill   Future visit scheduled?   Not with provider    (Medicare wellness in 4 months)  Notes to Clinic:  Medication failed RX refill protocol due to no valid encounter in past 6 months and labs not within 180 days.  Last visit 8 months ago.

## 2020-03-20 ENCOUNTER — Other Ambulatory Visit: Payer: Self-pay | Admitting: Family Medicine

## 2020-03-27 ENCOUNTER — Other Ambulatory Visit: Payer: Self-pay | Admitting: Family Medicine

## 2020-03-27 DIAGNOSIS — E039 Hypothyroidism, unspecified: Secondary | ICD-10-CM

## 2020-03-27 NOTE — Telephone Encounter (Signed)
Requested medication (s) are due for refill today: yes  Requested medication (s) are on the active medication list: yes  Last refill:  02/27/20 #30  Future visit scheduled: yes, 03/30/20  Notes to clinic:  Please review for refill. 30 day courtesy refill given on 02/27/20    Requested Prescriptions  Pending Prescriptions Disp Refills   levothyroxine (SYNTHROID) 75 MCG tablet [Pharmacy Med Name: LEVOTHYROXINE 75 MCG TABLET] 30 tablet 0    Sig: TAKE 1 TABLET (75 MCG TOTAL) BY MOUTH DAILY BEFORE BREAKFAST. ONE DAILY AND HALF ON SUNDAYS      Endocrinology:  Hypothyroid Agents Failed - 03/27/2020 12:46 AM      Failed - TSH needs to be rechecked within 3 months after an abnormal result. Refill until TSH is due.      Failed - TSH in normal range and within 360 days    TSH  Date Value Ref Range Status  07/09/2019 7.57 (H) 0.40 - 4.50 mIU/L Final          Passed - Valid encounter within last 12 months    Recent Outpatient Visits           8 months ago Uncontrolled hypertension   [facility] [place], Danna Hefty, MD   8 months ago History of TIA (transient ischemic attack)   [facility] Alba Cory, MD   11 months ago Embedded tick of neck, initial encounter   [facility] Doren Custard, FNP   1 year ago Hypertension, benign   [facility] Alba Cory, MD   1 year ago Uncontrolled hypertension   [facility] Alba Cory, MD       Future Appointments             In 3 days Alba Cory, MD [facility]   In 4 months  [facility]

## 2020-03-30 ENCOUNTER — Ambulatory Visit: Payer: Medicare Other | Admitting: Family Medicine

## 2020-04-12 ENCOUNTER — Other Ambulatory Visit: Payer: Self-pay | Admitting: Family Medicine

## 2020-04-12 DIAGNOSIS — I10 Essential (primary) hypertension: Secondary | ICD-10-CM

## 2020-04-16 ENCOUNTER — Other Ambulatory Visit: Payer: Self-pay

## 2020-04-16 ENCOUNTER — Encounter: Payer: Self-pay | Admitting: Family Medicine

## 2020-04-16 ENCOUNTER — Ambulatory Visit (INDEPENDENT_AMBULATORY_CARE_PROVIDER_SITE_OTHER): Payer: Medicare Other | Admitting: Family Medicine

## 2020-04-16 VITALS — BP 184/98 | HR 104 | Temp 97.5°F | Resp 16 | Ht 68.0 in | Wt 179.5 lb

## 2020-04-16 DIAGNOSIS — R399 Unspecified symptoms and signs involving the genitourinary system: Secondary | ICD-10-CM

## 2020-04-16 DIAGNOSIS — D509 Iron deficiency anemia, unspecified: Secondary | ICD-10-CM | POA: Diagnosis not present

## 2020-04-16 DIAGNOSIS — E039 Hypothyroidism, unspecified: Secondary | ICD-10-CM | POA: Diagnosis not present

## 2020-04-16 DIAGNOSIS — I10 Essential (primary) hypertension: Secondary | ICD-10-CM

## 2020-04-16 DIAGNOSIS — Z91199 Patient's noncompliance with other medical treatment and regimen due to unspecified reason: Secondary | ICD-10-CM

## 2020-04-16 DIAGNOSIS — Z9119 Patient's noncompliance with other medical treatment and regimen: Secondary | ICD-10-CM

## 2020-04-16 DIAGNOSIS — M109 Gout, unspecified: Secondary | ICD-10-CM

## 2020-04-16 DIAGNOSIS — Z8673 Personal history of transient ischemic attack (TIA), and cerebral infarction without residual deficits: Secondary | ICD-10-CM | POA: Diagnosis not present

## 2020-04-16 DIAGNOSIS — R350 Frequency of micturition: Secondary | ICD-10-CM | POA: Diagnosis not present

## 2020-04-16 DIAGNOSIS — G8929 Other chronic pain: Secondary | ICD-10-CM

## 2020-04-16 DIAGNOSIS — M5441 Lumbago with sciatica, right side: Secondary | ICD-10-CM

## 2020-04-16 DIAGNOSIS — E049 Nontoxic goiter, unspecified: Secondary | ICD-10-CM

## 2020-04-16 LAB — POCT URINALYSIS DIPSTICK
Bilirubin, UA: NEGATIVE
Blood, UA: NEGATIVE
Glucose, UA: NEGATIVE
Ketones, UA: NEGATIVE
Leukocytes, UA: NEGATIVE
Nitrite, UA: NEGATIVE
Protein, UA: NEGATIVE
Spec Grav, UA: 1.01 (ref 1.010–1.025)
Urobilinogen, UA: 0.2 E.U./dL
pH, UA: 6.5 (ref 5.0–8.0)

## 2020-04-16 MED ORDER — TIZANIDINE HCL 2 MG PO TABS: 2.0000 mg | ORAL_TABLET | Freq: Four times a day (QID) | ORAL | 0 refills | Status: DC | PRN

## 2020-04-16 MED ORDER — PREGABALIN 50 MG PO CAPS: 50.0000 mg | ORAL_CAPSULE | Freq: Three times a day (TID) | ORAL | 0 refills | Status: DC

## 2020-04-16 MED ORDER — CHLORTHALIDONE 25 MG PO TABS: 25.0000 mg | ORAL_TABLET | Freq: Every day | ORAL | 1 refills | Status: DC

## 2020-04-16 MED ORDER — TAMSULOSIN HCL 0.4 MG PO CAPS: 0.4000 mg | ORAL_CAPSULE | Freq: Every day | ORAL | 1 refills | Status: DC

## 2020-04-16 MED ORDER — BENAZEPRIL HCL 20 MG PO TABS: 20.0000 mg | ORAL_TABLET | Freq: Every day | ORAL | 1 refills | Status: DC

## 2020-04-16 NOTE — Progress Notes (Signed)
Name: Dennis Ford   MRN: [id number]    DOB: [birth year]   Date:04/16/2020       Progress Note  Subjective  Chief Complaint  Chief Complaint  Patient presents with  . Hypertension  . Hyperglycemia  . Hyperlipidemia  . Medication Refill  . Urinary Frequency    Patient is complaining of lower back and abdominal pain. He thinks that it could have been from his surgery, but he has had some urinary frequency as well.    HPI  HTN: he used to take norvasc but stopped years ago because developed angioedema years ago, he was also given carvedilol and hydralazine but he stops when he feels like it is not working, he is on benazepril for many years but bp is very high. He states Benazepril is the only that seems to work,however when he came last time we increased from one daily to one in am and half in pm, however he states it made him feel very tired and he is only taking the am dose again.He states bp at [employer] been 130/80's. He has been taking chlorthalidone. He has urinary frequency but thinks started before starting the medication  Goiter:he has not been back to see endo, reviewed previous US and showed goiter, Last TSH was not at goal, we will recheck labs today , he is taking levothyroxine 75 mcg daily   Gout: he states only takes allopurinol prn, explained that it is not the way the medication works and can make the flare worse, but he does not want to change. We stopped medication on his last visit and not flares since.   Hyperglycemia: A1C at [facility] was 5.4% in Dec2019 , denies polyphagia, polydipsiaor polyuria . Last fasting glucose was normal   Iron deficiency anemia: he had GI bleed in Nov 2019,  Last ferritin was still low but no anemia, we will recheck labs . He is not interested in seeing GI at this time  Right lower quadrant pain: he noticed pain on RLQ that radiates to right lower back last year - it started when he started to work more on his yard - racking . He states pain is  described as a pain that he cannot described , he states sometimes radiates to right posterior thigh, states stretching your back helps, but when severe he needs to sit down and rest. No bladder or bowel incontinence  Urinary frequency: he has noticed increase in urinary frequency and nocturia and is going for about one year, no hematuria, dysuria.   IPSS Questionnaire (AUA-7): Over the past month.   1)  How often have you had a sensation of not emptying your bladder completely after you finish urinating?  0 - Not at all  2)  How often have you had to urinate again less than two hours after you finished urinating? 1 - Less than 1 time in 5  3)  How often have you found you stopped and started again several times when you urinated?  0 - Not at all  4) How difficult have you found it to postpone urination?  3 - About half the time  5) How often have you had a weak urinary stream?  3 - About half the time  6) How often have you had to push or strain to begin urination?  0 - Not at all  7) How many times did you most typically get up to urinate from the time you went to bed until the time you  got up in the morning?  3 - 3 times  Total score:  0-7 mildly symptomatic   8-19 moderately symptomatic   20-35 severely symptomatic    Patient Active Problem List   Diagnosis Date Noted  . Iron deficiency anemia 07/07/2019  . History of TIAs 12/10/2018  . HLD (hyperlipidemia) 11/02/2018  . Intermittent low back pain 12/20/2016  . Hyperglycemia 06/01/2016  . Hypertension, benign 05/31/2016  . 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 Abnormal TSH 06/04/2015    Past Surgical History:  Procedure Laterality Date  . COLONOSCOPY    . CYST EXCISION Left 2008   Upper Back on Left Shoulder  . ESOPHAGOGASTRODUODENOSCOPY (EGD) WITH PROPOFOL N/A 11/02/2018   Procedure: ESOPHAGOGASTRODUODENOSCOPY (EGD) WITH PROPOFOL;  Surgeon: Midge Minium, MD;  Location[facility] END[facility]COPY;  Service: Endoscopy;  Laterality: N/A;  . HERNIA  REPAIR     LIH   . INGUINAL HERNIA REPAIR  11/27/2011   Procedure: HERNIA REPAIR INGUINAL ADULT;  Surgeon: Jetty Duhamel, MD;  Location: [facility];  Service: General;  Laterality: Right;  right inguinal hernia repair with mesh    Family History  Problem Relation Age of Onset  . Breast cancer Mother     Social History   Tobacco Use  . Smoking status: Former Smoker    Types: Cigars    Quit date: 11/22/1988    Years since quitting: 31.4  . Smokeless tobacco: Never Used  Substance Use Topics  . Alcohol use: No    Comment: he quit 10/2016     Current Outpatient Medications:  .  allopurinol (ZYLOPRIM) 100 MG tablet, Take 1 tablet (100 mg total) by mouth 2 (two) times daily., Disp: 60 tablet, Rfl: 6 .  benazepril (LOTENSIN) 20 MG tablet, TAKE 1 TABLET BY MOUTH IN THE MORNING AND 1/2 TABLET IN EVENING, Disp: 45 tablet, Rfl: 0 .  chlorthalidone (HYGROTON) 25 MG tablet, TAKE 1 TABLET BY MOUTH EVERY DAY, Disp: 30 tablet, Rfl: 0 .  levothyroxine (SYNTHROID) 75 MCG tablet, TAKE 1 TABLET (75 MCG TOTAL) BY MOUTH DAILY BEFORE BREAKFAST. ONE DAILY AND HALF ON SUNDAYS, Disp: 30 tablet, Rfl: 0 .  triamcinolone (KENALOG) 0.025 % cream, Apply 1 application topically 2 (two) times daily as needed (for inflammation/itching)., Disp: 80 g, Rfl: 0  Allergies  Allergen Reactions  . Aspirin     GI bleed   . Norvasc [Amlodipine] Other (See Comments)    angioedema  . Statins     Per patient GI bleed and upset stomach     I personally reviewed active problem list, medication list, allergies, family history, social history, health maintenance with the patient/caregiver today.   ROS  Constitutional: Negative for fever or weight change.  Respiratory: Negative for cough and shortness of breath.   Cardiovascular: Negative for chest pain or palpitations.  Gastrointestinal: Negative for abdominal pain, no bowel changes.  Musculoskeletal: positive  for gait problem when he has a lot of  back pain, but no joint swelling.  Skin: Negative for rash.  Neurological: Negative for dizziness or headache.  No other specific complaints in a complete review of systems (except as listed in HPI above).  Objective  Vitals:   04/16/20 1334  BP: (!) 184/98  Pulse: (!) 104  Resp: 16  Temp: (!) 97.5 F (36.4 C)  TempSrc: Temporal  SpO2: 96%  Weight: 179 lb 8 oz (81.4 kg)  Height: 5\' 8"  (1.727 m)    Body mass index is 27.29 kg/m.  Physical Exam  Constitutional: Patient appears well-developed and well-nourished.No distress.  HEENT: head atraumatic, normocephalic, pupils equal and reactive to light, neck supple Cardiovascular: Normal rate, regular rhythm and normal heart sounds.  No murmur heard. No BLE edema. Pulmonary/Chest: Effort normal and breath sounds normal. No respiratory distress. Muscular skeletal: pain during palpation of lumbar spine, negative straight leg raise, normal rom of spine Abdominal: Soft.  There is no tenderness. Psychiatric: Patient has a normal mood and affect. behavior is normal. Judgment and thought content normal. Neurological exam: no focal findings.   PHQ2/9: Depression screen [facility] 2/9 04/16/2020 08/07/2019 07/09/2019 07/07/2019 04/15/2019  Decreased Interest 0 0 0 0 0  Down, Depressed, Hopeless 0 0 0 0 0  PHQ - 2 Score 0 0 0 0 0  Altered sleeping 0 - 0 0 0  Tired, decreased energy 0 - 0 0 0  Change in appetite 0 - 0 0 0  Feeling bad or failure about yourself  0 - 0 0 0  Trouble concentrating 0 - 0 0 0  Moving slowly or fidgety/restless 0 - 0 0 0  Suicidal thoughts 0 - 0 0 0  PHQ-9 Score 0 - 0 0 0  Difficult doing work/chores - - - Not difficult at all -    phq 9 is negative   Fall Risk: Fall Risk  04/16/2020 08/07/2019 07/09/2019 07/07/2019 04/15/2019  Falls in the past year? 0 0 0 0 0  Number falls in past yr: 0 0 0 0 0  Injury with Fall? 0 0 0 0 0  Follow up - Falls prevention discussed - - -     Functional Status Survey: Is the patient  deaf or have difficulty hearing?: No Does the patient have difficulty seeing, even when wearing glasses/contacts?: No Does the patient have difficulty concentrating, remembering, or making decisions?: No Does the patient have difficulty walking or climbing stairs?: No Does the patient have difficulty dressing or bathing?: No Does the patient have difficulty doing errands alone such as visiting a doctor's office or shopping?: No    Assessment & Plan  1. Urinary frequency  - POCT Urinalysis Dipstick - CULTURE, URINE COMPREHENSIVE  2. Acquired hypothyroidism  - TSH  3. Uncontrolled hypertension  - CBC with Differential/Platelet - COMPLETE METABOLIC PANEL WITH GFR - chlorthalidone (HYGROTON) 25 MG tablet; Take 1 tablet (25 mg total) by mouth daily.  Dispense: 90 tablet; Refill: 1 - benazepril (LOTENSIN) 20 MG tablet; Take 1 tablet (20 mg total) by mouth daily. TAKE 1 TABLET BY MOUTH IN THE MORNING AND 1/2 TABLET IN EVENING  Dispense: 90 tablet; Refill: 1  4. History of TIA (transient ischemic attack)  Refuses statin therapy  5. Iron deficiency anemia, unspecified iron deficiency anemia type  CBC with Differential/Platelet - Iron, TIBC and Ferritin Panel  6. Goiter  He states not having any problems   7. Controlled gout  Not on medications  8. Chronic right-sided low back pain with right-sided sciatica  - tiZANidine (ZANAFLEX) 2 MG tablet; Take 1 tablet (2 mg total) by mouth every 6 (six) hours as needed for muscle spasms.  Dispense: 30 tablet; Refill: 0 - pregabalin (LYRICA) 50 MG capsule; Take 1 capsule (50 mg total) by mouth 3 (three) times daily. Start at night and go up to three times a day if tolerated  Dispense: 90 capsule; Refill: 0  He states his pain is from his bladder and area of inguinal hernia repair, but I explained mostly likely from radiculitis.   Discussed therapy, we  need to avoid nsaids and also prednisone due to uncontrolled HTN, we will try lyrica  and muscle relaxer, avoiding activities that are triggering symptoms .   9. Lower urinary tract symptoms (LUTS)  - tamsulosin (FLOMAX) 0.4 MG CAPS capsule; Take 1 capsule (0.4 mg total) by mouth daily.  Dispense: 90 capsule; Refill: 1 - CULTURE, URINE COMPREHENSIVE

## 2020-04-18 LAB — COMPLETE METABOLIC PANEL WITH GFR
AG Ratio: 1.3 (calc) (ref 1.0–2.5)
ALT: 8 U/L — ABNORMAL LOW (ref 9–46)
AST: 14 U/L (ref 10–35)
Albumin: 4 g/dL (ref 3.6–5.1)
Alkaline phosphatase (APISO): 47 U/L (ref 35–144)
BUN: 15 mg/dL (ref 7–25)
CO2: 27 mmol/L (ref 20–32)
Calcium: 9.5 mg/dL (ref 8.6–10.3)
Chloride: 104 mmol/L (ref 98–110)
Creat: 1.16 mg/dL (ref 0.70–1.18)
GFR, Est African American: 69 mL/min/{1.73_m2} (ref >=60)
GFR, Est Non African American: 60 mL/min/{1.73_m2} (ref >=60)
Globulin: 3 g/dL (calc) (ref 1.9–3.7)
Glucose, Bld: 92 mg/dL (ref 65–99)
Potassium: 4.1 mmol/L (ref 3.5–5.3)
Sodium: 138 mmol/L (ref 135–146)
Total Bilirubin: 0.3 mg/dL (ref 0.2–1.2)
Total Protein: 7 g/dL (ref 6.1–8.1)

## 2020-04-18 LAB — CBC WITH DIFFERENTIAL/PLATELET
Absolute Monocytes: 511 cells/uL (ref 200–950)
Basophils Absolute: 52 cells/uL (ref 0–200)
Basophils Relative: 1.4 %
Eosinophils Absolute: 41 cells/uL (ref 15–500)
Eosinophils Relative: 1.1 %
HCT: 45.4 % (ref 38.5–50.0)
Hemoglobin: 14 g/dL (ref 13.2–17.1)
Lymphs Abs: 1092 cells/uL (ref 850–3900)
MCH: 25.5 pg — ABNORMAL LOW (ref 27.0–33.0)
MCHC: 30.8 g/dL — ABNORMAL LOW (ref 32.0–36.0)
MCV: 82.8 fL (ref 80.0–100.0)
MPV: 10 fL (ref 7.5–12.5)
Monocytes Relative: 13.8 %
Neutro Abs: 2005 cells/uL (ref 1500–7800)
Neutrophils Relative %: 54.2 %
Platelets: 313 10*3/uL (ref 140–400)
RBC: 5.48 10*6/uL (ref 4.20–5.80)
RDW: 14.7 % (ref 11.0–15.0)
Total Lymphocyte: 29.5 %
WBC: 3.7 10*3/uL — ABNORMAL LOW (ref 3.8–10.8)

## 2020-04-18 LAB — IRON,TIBC AND FERRITIN PANEL
%SAT: 10 % (calc) — ABNORMAL LOW (ref 20–48)
Ferritin: 17 ng/mL — ABNORMAL LOW (ref 24–380)
Iron: 31 ug/dL — ABNORMAL LOW (ref 50–180)
TIBC: 304 mcg/dL (calc) (ref 250–425)

## 2020-04-18 LAB — CULTURE, URINE COMPREHENSIVE
MICRO NUMBER:: 10585639
RESULT:: NO GROWTH
SPECIMEN QUALITY:: ADEQUATE

## 2020-04-18 LAB — TSH: TSH: 2.84 mIU/L (ref 0.40–4.50)

## 2020-05-23 ENCOUNTER — Other Ambulatory Visit: Payer: Self-pay | Admitting: Family Medicine

## 2020-05-23 DIAGNOSIS — E039 Hypothyroidism, unspecified: Secondary | ICD-10-CM

## 2020-05-23 NOTE — Telephone Encounter (Signed)
Requested Prescriptions  Pending Prescriptions Disp Refills  . levothyroxine (SYNTHROID) 75 MCG tablet [Pharmacy Med Name: LEVOTHYROXINE 75 MCG TABLET] 90 tablet 3    Sig: TAKE 1 TABLET (75 MCG TOTAL) BY MOUTH DAILY BEFORE BREAKFAST. ONE DAILY AND HALF ON SUNDAYS     Endocrinology:  Hypothyroid Agents Failed - 05/23/2020  1:14 AM      Failed - TSH needs to be rechecked within 3 months after an abnormal result. Refill until TSH is due.      Passed - TSH in normal range and within 360 days    TSH  Date Value Ref Range Status  04/16/2020 2.84 0.40 - 4.50 mIU/L Final         Passed - Valid encounter within last 12 months    Recent Outpatient Visits          1 month ago Acquired hypothyroidism   [facility] Alba Cory, MD   10 months ago Uncontrolled hypertension   [facility] [place], Danna Hefty, MD   10 months ago History of TIA (transient ischemic attack)   [facility] Alba Cory, MD   1 year ago Embedded tick of neck, initial encounter   [facility] Doren Custard, FNP   1 year ago Hypertension, benign   [facility] Alba Cory, MD      Future Appointments            In 2 months [facility], [facility]

## 2020-07-22 IMAGING — CT CT HEAD W/O CM
3 series · 15 of 46 positions shown, 18 images · non-contrast
Comparison: None.

CLINICAL DATA: Chronic headache

EXAM:
CT HEAD WITHOUT CONTRAST
TECHNIQUE: Contiguous axial images were obtained from the base of the skull
through the vertex without intravenous contrast.

[Series 2: head wo · axial · 0.47mm/px · z∈[-152,-32]mm · 9 of 29 slices shown, 12 images]
[im 3/29  brain]
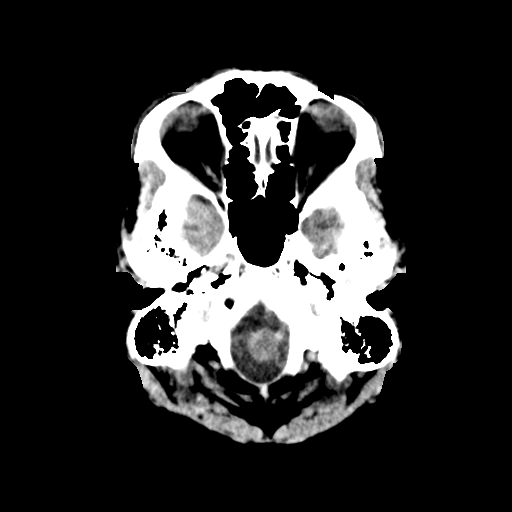
[im 3/29  bone]
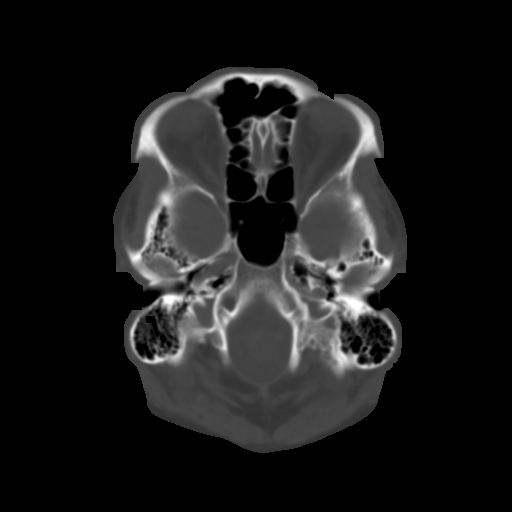
[im 6/29  brain]
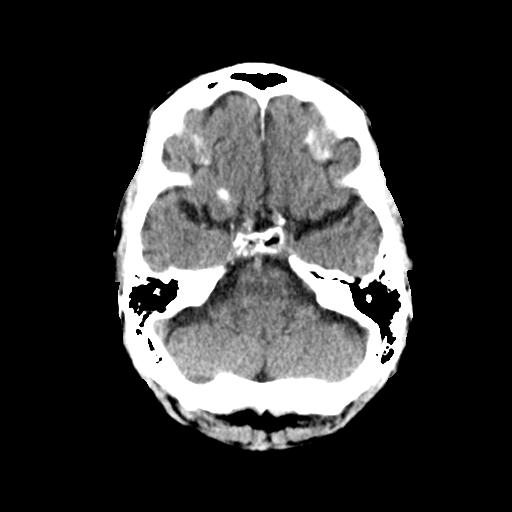
[im 9/29  brain]
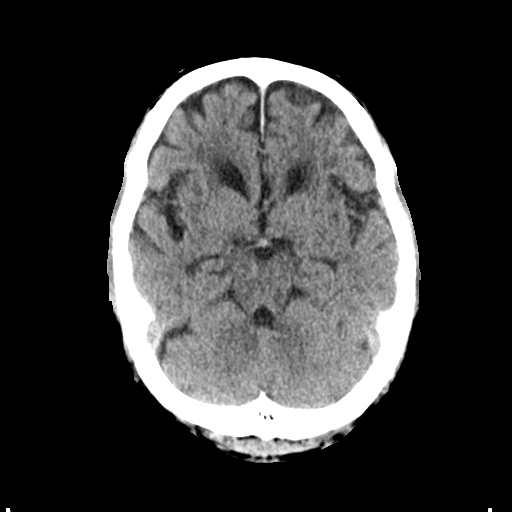
[im 12/29  brain]
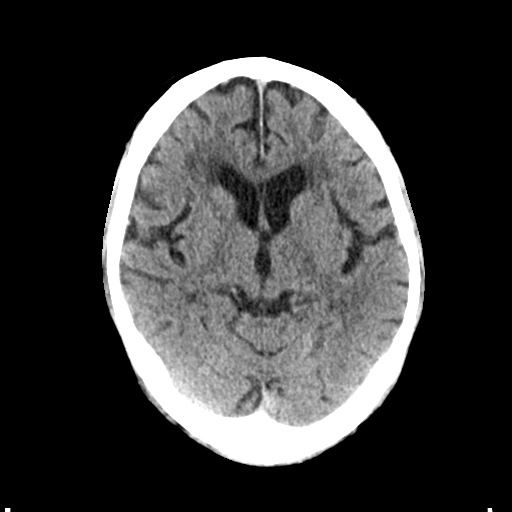
[im 15/29  brain]
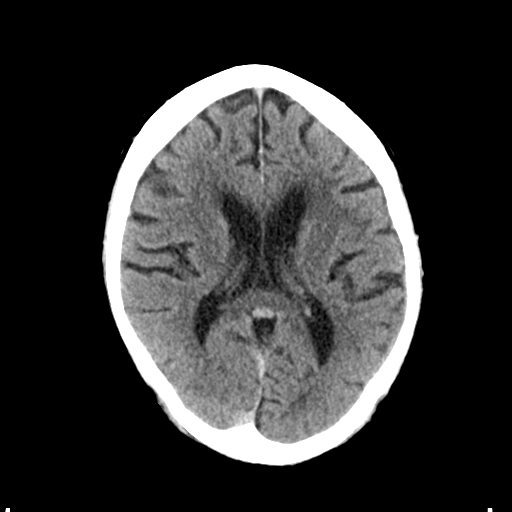
[im 15/29  bone]
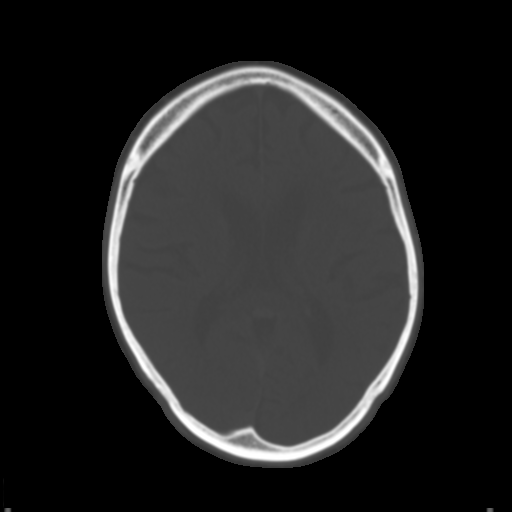
[im 18/29  brain]
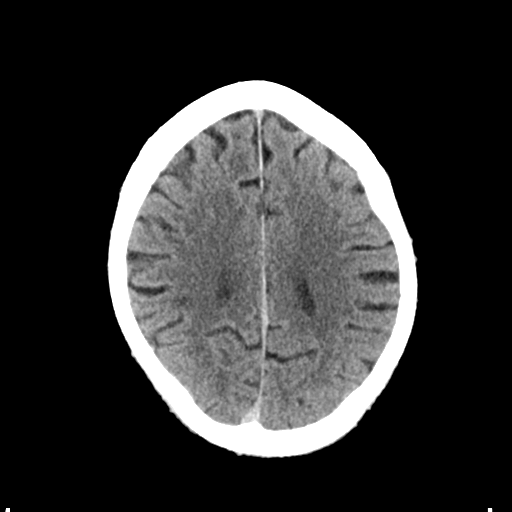
[im 21/29  brain]
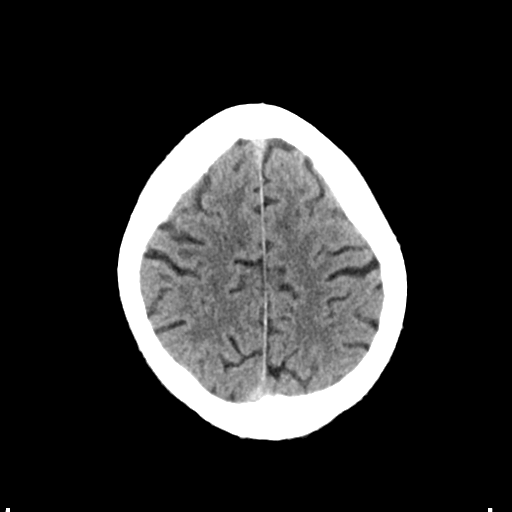
[im 24/29  brain]
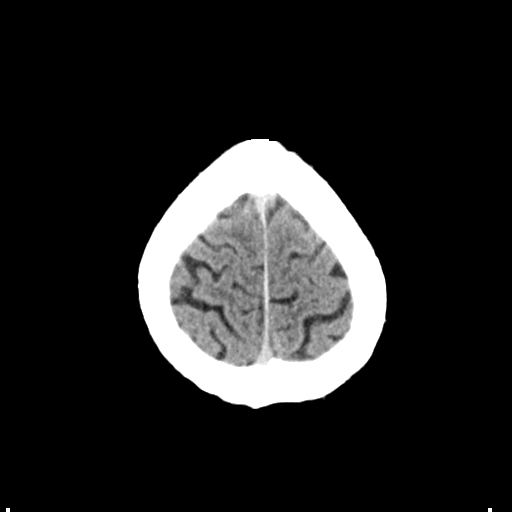
[im 27/29  brain]
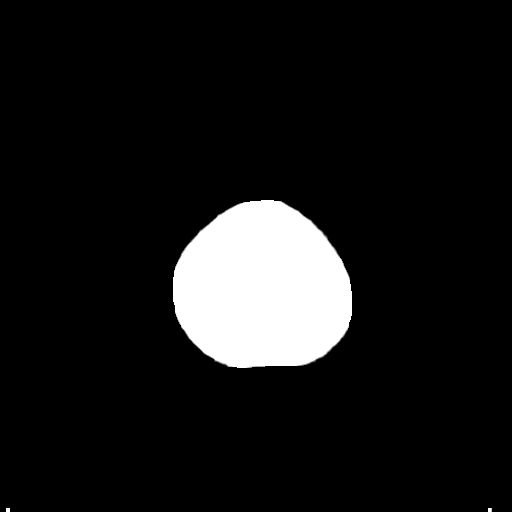
[im 27/29  bone]
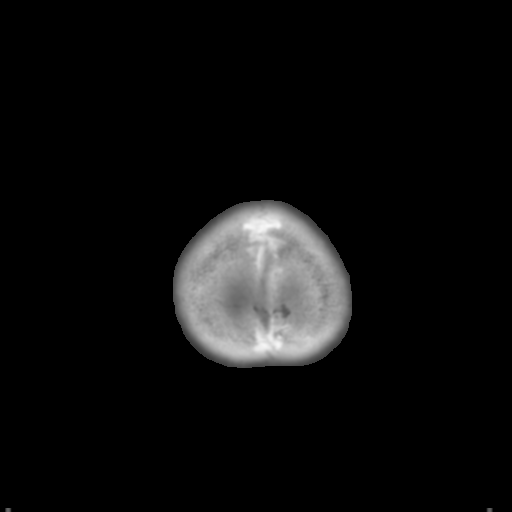

[Series 4: coronal soft tissue · coronal · 0.29mm/px · 3 of 66 slices shown]
[im 22/66  brain]
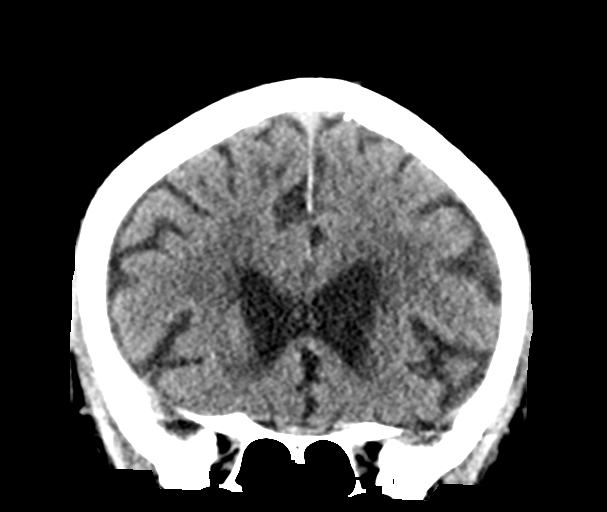
[im 29/66  brain]
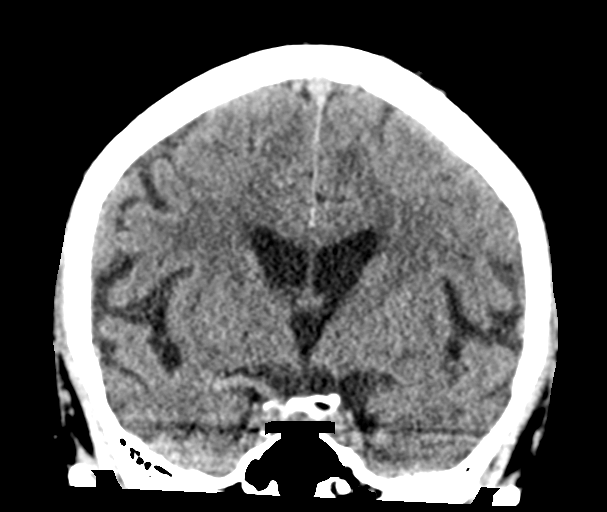
[im 37/66  brain]
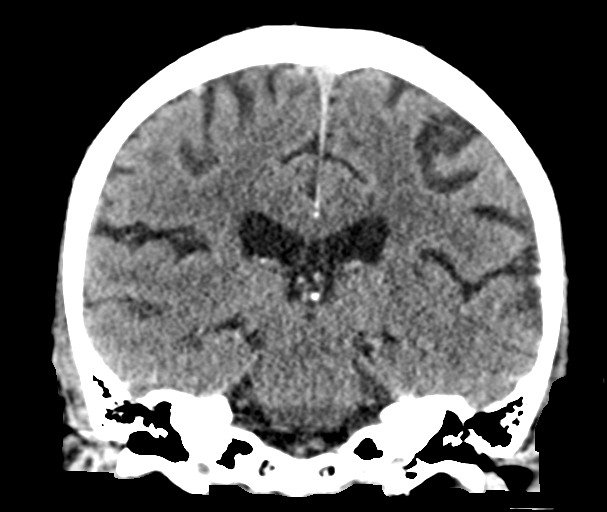

[Series 5: sagittal soft tissue · sagittal · 0.29mm/px · 3 of 54 slices shown]
[im 18/54  brain]
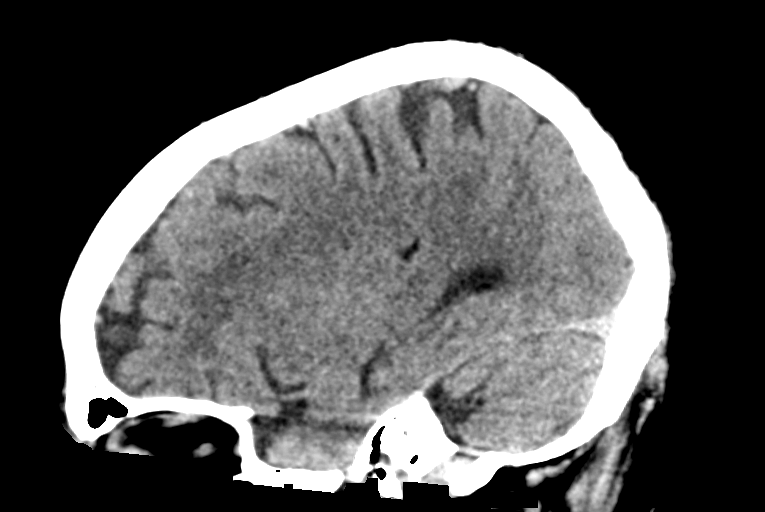
[im 27/54  brain]
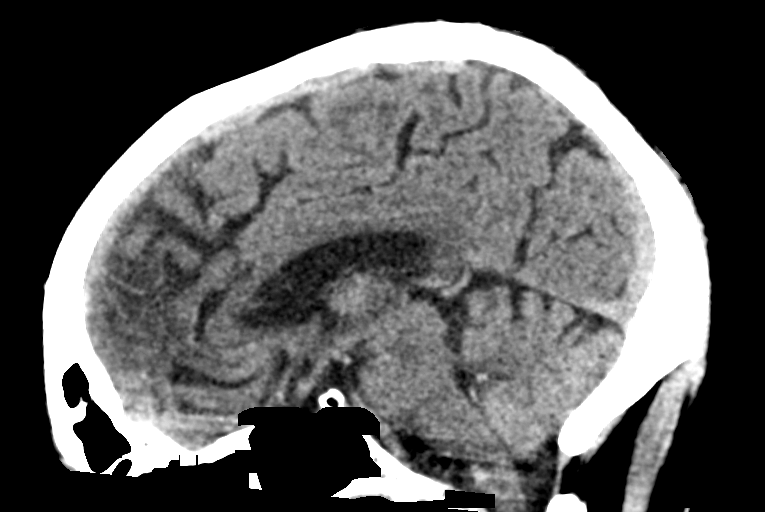
[im 36/54  brain]
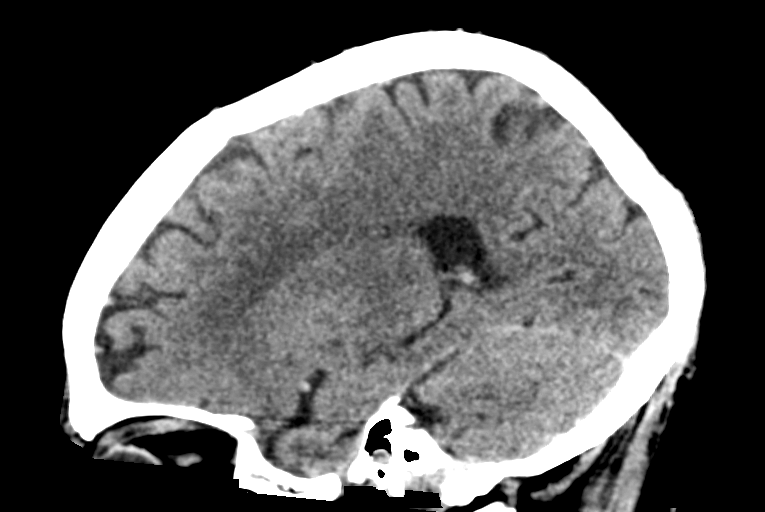

[15 of 46 positions shown; findings below may reference images not displayed]

FINDINGS: Brain: No evidence of acute infarction, hemorrhage, hydrocephalus,
extra-axial collection or mass lesion/mass effect.

Subcortical white matter and periventricular small vessel ischemic
changes.

Vascular: Mild intracranial atherosclerosis.

Skull: Normal. Negative for fracture or focal lesion.

Sinuses/Orbits: The visualized paranasal sinuses are essentially
clear. The mastoid air cells are unopacified.

Other: None.
IMPRESSION: No evidence of acute intracranial abnormality.

Mild small vessel ischemic changes.

## 2020-08-10 ENCOUNTER — Ambulatory Visit: Payer: Medicare Other

## 2020-08-19 ENCOUNTER — Other Ambulatory Visit: Payer: Self-pay | Admitting: Family Medicine

## 2020-08-19 DIAGNOSIS — E039 Hypothyroidism, unspecified: Secondary | ICD-10-CM

## 2020-10-19 ENCOUNTER — Ambulatory Visit: Payer: Medicare Other

## 2020-11-12 ENCOUNTER — Other Ambulatory Visit: Payer: Self-pay | Admitting: Family Medicine

## 2020-11-12 DIAGNOSIS — R399 Unspecified symptoms and signs involving the genitourinary system: Secondary | ICD-10-CM

## 2020-12-07 ENCOUNTER — Ambulatory Visit (INDEPENDENT_AMBULATORY_CARE_PROVIDER_SITE_OTHER): Payer: Medicare Other

## 2020-12-07 DIAGNOSIS — Z0000 Encounter for general adult medical examination without abnormal findings: Secondary | ICD-10-CM

## 2020-12-07 NOTE — Patient Instructions (Signed)
[name] , Thank you for taking time to come for your Medicare Wellness Visit. I appreciate your ongoing commitment to your health goals. Please review the following plan we discussed and let me know if I can assist you in the future.   Screening recommendations/referrals: Colonoscopy: no longer required Recommended yearly ophthalmology/optometry visit for glaucoma screening and checkup Recommended yearly dental visit for hygiene and checkup  Vaccinations: Influenza vaccine: declined Pneumococcal vaccine: done 05/31/16 Tdap vaccine: done 10/16/13 Shingles vaccine: Shingrix discussed. Please contact your pharmacy for coverage information.  Covid-19: done 06/16/20 & 07/07/20  Advanced directives: Advance directive discussed with you today. Even though you declined this today please call our office should you change your mind and we can give you the proper paperwork for you to fill out.  Conditions/risks identified: Recommend increasing physical activity   Next appointment: Follow up in one year for your annual wellness visit.   Preventive Care 9 Years and Older, Male Preventive care refers to lifestyle choices and visits with your health care provider that can promote health and wellness. What does preventive care include?  A yearly physical exam. This is also called an annual well check.  Dental exams once or twice a year.  Routine eye exams. Ask your health care provider how often you should have your eyes checked.  Personal lifestyle choices, including:  Daily care of your teeth and gums.  Regular physical activity.  Eating a healthy diet.  Avoiding tobacco and drug use.  Limiting alcohol use.  Practicing safe sex.  Taking low doses of aspirin every day.  Taking vitamin and mineral supplements as recommended by your health care provider. What happens during an annual well check? The services and screenings done by your health care provider during your annual well  check will depend on your age, overall health, lifestyle risk factors, and family history of disease. Counseling  Your health care provider may ask you questions about your:  Alcohol use.  Tobacco use.  Drug use.  Emotional well-being.  Home and relationship well-being.  Sexual activity.  Eating habits.  History of falls.  Memory and ability to understand (cognition).  Work and work Astronomer. Screening  You may have the following tests or measurements:  Height, weight, and BMI.  Blood pressure.  Lipid and cholesterol levels. These may be checked every 5 years, or more frequently if you are over 75 years old.  Skin check.  Lung cancer screening. You may have this screening every year starting at age 25 if you have a 30-pack-year history of smoking and currently smoke or have quit within the past 15 years.  Fecal occult blood test (FOBT) of the stool. You may have this test every year starting at age 49.  Flexible sigmoidoscopy or colonoscopy. You may have a sigmoidoscopy every 5 years or a colonoscopy every 10 years starting at age 64.  Prostate cancer screening. Recommendations will vary depending on your family history and other risks.  Hepatitis C blood test.  Hepatitis B blood test.  Sexually transmitted disease (STD) testing.  Diabetes screening. This is done by checking your blood sugar (glucose) after you have not eaten for a while (fasting). You may have this done every 1-3 years.  Abdominal aortic aneurysm (AAA) screening. You may need this if you are a current or former smoker.  Osteoporosis. You may be screened starting at age 60 if you are at high risk. Talk with your health care provider about your test results, treatment options, and  if necessary, the need for more tests. Vaccines  Your health care provider may recommend certain vaccines, such as:  Influenza vaccine. This is recommended every year.  Tetanus, diphtheria, and acellular  pertussis (Tdap, Td) vaccine. You may need a Td booster every 10 years.  Zoster vaccine. You may need this after age 40.  Pneumococcal 13-valent conjugate (PCV13) vaccine. One dose is recommended after age 57.  Pneumococcal polysaccharide (PPSV23) vaccine. One dose is recommended after age 3. Talk to your health care provider about which screenings and vaccines you need and how often you need them. This information is not intended to replace advice given to you by your health care provider. Make sure you discuss any questions you have with your health care provider. Document Released: 11/19/2015 Document Revised: 07/12/2016 Document Reviewed: 08/24/2015 Elsevier Interactive Patient Education  2017 [place] Prevention in the Home Falls can cause injuries. They can happen to people of all ages. There are many things you can do to make your home safe and to help prevent falls. What can I do on the outside of my home?  Regularly fix the edges of walkways and driveways and fix any cracks.  Remove anything that might make you trip as you walk through a door, such as a raised step or threshold.  Trim any bushes or trees on the path to your home.  Use bright outdoor lighting.  Clear any walking paths of anything that might make someone trip, such as rocks or tools.  Regularly check to see if handrails are loose or broken. Make sure that both sides of any steps have handrails.  Any raised decks and porches should have guardrails on the edges.  Have any leaves, snow, or ice cleared regularly.  Use sand or salt on walking paths during winter.  Clean up any spills in your garage right away. This includes oil or grease spills. What can I do in the bathroom?  Use night lights.  Install grab bars by the toilet and in the tub and shower. Do not use towel bars as grab bars.  Use non-skid mats or decals in the tub or shower.  If you need to sit down in the shower, use a plastic,  non-slip stool.  Keep the floor dry. Clean up any water that spills on the floor as soon as it happens.  Remove soap buildup in the tub or shower regularly.  Attach bath mats securely with double-sided non-slip rug tape.  Do not have throw rugs and other things on the floor that can make you trip. What can I do in the bedroom?  Use night lights.  Make sure that you have a light by your bed that is easy to reach.  Do not use any sheets or blankets that are too big for your bed. They should not hang down onto the floor.  Have a firm chair that has side arms. You can use this for support while you get dressed.  Do not have throw rugs and other things on the floor that can make you trip. What can I do in the kitchen?  Clean up any spills right away.  Avoid walking on wet floors.  Keep items that you use a lot in easy-to-reach places.  If you need to reach something above you, use a strong step stool that has a grab bar.  Keep electrical cords out of the way.  Do not use floor polish or wax that makes floors slippery. If you  must use wax, use non-skid floor wax.  Do not have throw rugs and other things on the floor that can make you trip. What can I do with my stairs?  Do not leave any items on the stairs.  Make sure that there are handrails on both sides of the stairs and use them. Fix handrails that are broken or loose. Make sure that handrails are as long as the stairways.  Check any carpeting to make sure that it is firmly attached to the stairs. Fix any carpet that is loose or worn.  Avoid having throw rugs at the top or bottom of the stairs. If you do have throw rugs, attach them to the floor with carpet tape.  Make sure that you have a light switch at the top of the stairs and the bottom of the stairs. If you do not have them, ask someone to add them for you. What else can I do to help prevent falls?  Wear shoes that:  Do not have high heels.  Have rubber  bottoms.  Are comfortable and fit you well.  Are closed at the toe. Do not wear sandals.  If you use a stepladder:  Make sure that it is fully opened. Do not climb a closed stepladder.  Make sure that both sides of the stepladder are locked into place.  Ask someone to hold it for you, if possible.  Clearly mark and make sure that you can see:  Any grab bars or handrails.  First and last steps.  Where the edge of each step is.  Use tools that help you move around (mobility aids) if they are needed. These include:  Canes.  Walkers.  Scooters.  Crutches.  Turn on the lights when you go into a dark area. Replace any light bulbs as soon as they burn out.  Set up your furniture so you have a clear path. Avoid moving your furniture around.  If any of your floors are uneven, fix them.  If there are any pets around you, be aware of where they are.  Review your medicines with your doctor. Some medicines can make you feel dizzy. This can increase your chance of falling. Ask your doctor what other things that you can do to help prevent falls. This information is not intended to replace advice given to you by your health care provider. Make sure you discuss any questions you have with your health care provider. Document Released: 08/19/2009 Document Revised: 03/30/2016 Document Reviewed: 11/27/2014 Elsevier Interactive Patient Education  2017 [employer].

## 2020-12-07 NOTE — Progress Notes (Signed)
Subjective:   Dennis Ford is a 80 y.o. male who presents for Medicare Annual/Subsequent preventive examination.  Virtual Visit via Telephone Note  I connected with  Dennis Ford on 12/07/20 at  9:20 AM EST by telephone and verified that I am speaking with the correct person using two identifiers.  Location: Patient: home Provider: [facility] Persons participating in the virtual visit: patient/Nurse Health Advisor   I discussed the limitations, risks, security and privacy concerns of performing an evaluation and management service by telephone and the availability of in person appointments. The patient expressed understanding and agreed to proceed.  Interactive audio and video telecommunications were attempted between this nurse and patient, however failed, due to patient having technical difficulties OR patient did not have access to video capability.  We continued and completed visit with audio only.  Some vital signs may be absent or patient reported.   Reather Littler, LPN   Review of Systems     Cardiac Risk Factors include: advanced age (>40men, >41 women);male gender;dyslipidemia;hypertension     Objective:    Today's Vitals   12/07/20 0925  PainSc: 8    There is no height or weight on file to calculate BMI.  Advanced Directives 12/07/2020 08/07/2019 11/02/2018 11/01/2018 10/29/2018 08/01/2017 05/04/2017  Does Patient Have a Medical Advance Directive? No No No No No No No  Would patient like information on creating a medical advance directive? No - Patient declined Yes (MAU/Ambulatory/Procedural Areas - Information given) No - Patient declined - No - Patient declined - -    Current Medications (verified) Outpatient Encounter Medications as of 12/07/2020  Medication Sig  . allopurinol (ZYLOPRIM) 100 MG tablet Take 1 tablet (100 mg total) by mouth 2 (two) times daily.  . benazepril (LOTENSIN) 20 MG tablet Take 1 tablet (20 mg total) by mouth daily. TAKE 1 TABLET BY MOUTH IN THE  MORNING AND 1/2 TABLET IN EVENING  . levothyroxine (SYNTHROID) 75 MCG tablet TAKE 1 TABLET (75 MCG TOTAL) BY MOUTH DAILY BEFORE BREAKFAST. ONE DAILY AND HALF ON SUNDAYS  . tamsulosin (FLOMAX) 0.4 MG CAPS capsule TAKE 1 CAPSULE BY MOUTH EVERY DAY  . tiZANidine (ZANAFLEX) 2 MG tablet Take 1 tablet (2 mg total) by mouth every 6 (six) hours as needed for muscle spasms.  . chlorthalidone (HYGROTON) 25 MG tablet Take 1 tablet (25 mg total) by mouth daily. (Patient not taking: Reported on 12/07/2020)  . pregabalin (LYRICA) 50 MG capsule Take 1 capsule (50 mg total) by mouth 3 (three) times daily. Start at night and go up to three times a day if tolerated (Patient not taking: Reported on 12/07/2020)  . triamcinolone (KENALOG) 0.025 % cream Apply 1 application topically 2 (two) times daily as needed (for inflammation/itching). (Patient not taking: Reported on 12/07/2020)   No facility-administered encounter medications on file as of 12/07/2020.    Allergies (verified) Aspirin, Norvasc [amlodipine], and Statins   History: Past Medical History:  Diagnosis Date  . Diabetes mellitus without complication (HCC)   . Goiter   . History of inguinal hernia repair   . Hyperlipidemia   . Hypertension    Past Surgical History:  Procedure Laterality Date  . COLONOSCOPY    . CYST EXCISION Left 2008   Upper Back on Left Shoulder  . ESOPHAGOGASTRODUODENOSCOPY (EGD) WITH PROPOFOL N/A 11/02/2018   Procedure: ESOPHAGOGASTRODUODENOSCOPY (EGD) WITH PROPOFOL;  Surgeon: Midge Minium, MD;  Location[facility];  Service: Endoscopy;  Laterality: N/A;  . HERNIA REPAIR     [facility]   .  INGUINAL HERNIA REPAIR  11/27/2011   Procedure: HERNIA REPAIR INGUINAL ADULT;  Surgeon: Jetty Duhamel, MD;  Location: [facility];  Service: General;  Laterality: Right;  right inguinal hernia repair with mesh   Family History  Problem Relation Age of Onset  . Breast cancer Mother    Social History   Socioeconomic History   . Marital status: Married    Spouse name: Not on file  . Number of children: 2  . Years of education: Not on file  . Highest education level: 10th grade  Occupational History  . Occupation: retired  Tobacco Use  . Smoking status: Former Smoker    Types: Cigars    Quit date: 11/22/1988    Years since quitting: 32.0  . Smokeless tobacco: Never Used  Vaping Use  . Vaping Use: Never used  Substance and Sexual Activity  . Alcohol use: No    Comment: he quit 10/2016  . Drug use: No  . Sexual activity: Yes    Partners: Female  Other Topics Concern  . Not on file  Social History Narrative  . Not on file   Social Determinants of Health   Financial Resource Strain: Low Risk   . Difficulty of Paying Living Expenses: Not hard at all  Food Insecurity: No Food Insecurity  . Worried About Programme researcher, broadcasting/film/video in the Last Year: Never true  . Ran Out of Food in the Last Year: Never true  Transportation Needs: No Transportation Needs  . Lack of Transportation (Medical): No  . Lack of Transportation (Non-Medical): No  Physical Activity: Inactive  . Days of Exercise per Week: 0 days  . Minutes of Exercise per Session: 0 Ford  Stress: No Stress Concern Present  . Feeling of Stress : Not at all  Social Connections: Moderately Isolated  . Frequency of Communication with Friends and Family: More than three times a week  . Frequency of Social Gatherings with Friends and Family: More than three times a week  . Attends Religious Services: Never  . Active Member of Clubs or Organizations: No  . Attends Banker Meetings: Never  . Marital Status: Married    Tobacco Counseling Counseling given: Not Answered   Clinical Intake:  Pre-visit preparation completed: Yes  Pain : 0-10 Pain Score: 8  Pain Type: Chronic pain Pain Location: Back Pain Orientation: Lower Pain Descriptors / Indicators: Aching,Sore Pain Onset: More than a month ago Pain Frequency: Constant      Nutritional Risks: None Diabetes: No  How often do you need to have someone help you when you read instructions, pamphlets, or other written materials from your doctor or pharmacy?: 1 - Never   Interpreter Needed?: No  Information entered by :: Reather Littler LPN   Activities of Daily Living In your present state of health, do you have any difficulty performing the following activities: 12/07/2020 04/16/2020  Hearing? N N  Comment declines hearing aids -  Vision? N N  Difficulty concentrating or making decisions? N N  Walking or climbing stairs? N N  Dressing or bathing? N N  Doing errands, shopping? N N  Preparing Food and eating ? N -  Using the Toilet? N -  In the past six months, have you accidently leaked urine? N -  Do you have problems with loss of bowel control? N -  Managing your Medications? N -  Managing your Finances? N -  Housekeeping or managing your Housekeeping? N -  Some recent data might be hidden    Patient Care Team: Alba Cory, MD as PCP - General (Family Medicine)  Indicate any recent Medical Services you may have received from other than [facility] providers in the past year (date may be approximate).     Assessment:   This is a routine wellness examination for Lela.  Hearing/Vision screen  Hearing Screening   125Hz  250Hz  500Hz  1000Hz  2000Hz  3000Hz  4000Hz  6000Hz  8000Hz   Right ear:           Left ear:           Comments: Pt denies hearing difficulty  Vision Screening Comments: Pt past due for eye exam. Established with [facility]  Dietary issues and exercise activities discussed: Current Exercise Habits: The patient does not participate in regular exercise at present, Exercise limited by: orthopedic condition(s)  Goals    . Patient Stated     Pt would like to maintain healthy blood pressure.       Depression Screen PHQ 2/9 Scores 12/07/2020 04/16/2020 08/07/2019 07/09/2019 07/07/2019 04/15/2019 02/10/2019  PHQ - 2 Score 0 0 0 0 0 0 0  PHQ- 9 Score -  0 - 0 0 0 0    Fall Risk Fall Risk  12/07/2020 04/16/2020 08/07/2019 07/09/2019 07/07/2019  Falls in the past year? 0 0 0 0 0  Number falls in past yr: 0 0 0 0 0  Injury with Fall? 0 0 0 0 0  Risk for fall due to : No Fall Risks - - - -  Follow up Falls prevention discussed - Falls prevention discussed - -    FALL RISK PREVENTION PERTAINING TO THE HOME:  Any stairs in or around the home? No  If so, are there any without handrails? No  Home free of loose throw rugs in walkways, pet beds, electrical cords, etc? Yes  Adequate lighting in your home to reduce risk of falls? Yes   ASSISTIVE DEVICES UTILIZED TO PREVENT FALLS:  Life alert? No  Use of a cane, walker or w/c? No  Grab bars in the bathroom? No  Shower chair or bench in shower? No  Elevated toilet seat or a handicapped toilet? No   TIMED UP AND GO:  Was the test performed? No . Telephonic visit.   Cognitive Function:     6CIT Screen 12/07/2020 08/07/2019  What Year? 0 points 0 points  What month? 0 points 0 points  What time? 0 points 0 points  Count back from 20 0 points 0 points  Months in reverse 2 points 0 points  Repeat phrase 6 points 6 points  Total Score 8 6    Immunizations Immunization History  Administered Date(s) Administered  . PFIZER(Purple Top)SARS-COV-2 Vaccination 06/16/2020, 07/07/2020  . Pneumococcal Conjugate-13 05/31/2016  . Pneumococcal Polysaccharide-23 01/31/2013  . Tdap 10/16/2013  . Zoster 07/09/2008    TDAP status: Up to date  Flu Vaccine status: Declined, Education has been provided regarding the importance of this vaccine but patient still declined. Advised may receive this vaccine at local pharmacy or Health Dept. Aware to provide a copy of the vaccination record if obtained from local pharmacy or Health Dept. Verbalized acceptance and understanding.  Pneumococcal vaccine status: Up to date  Covid-19 vaccine status: Completed vaccines  Qualifies for Shingles Vaccine? Yes   Zostavax  completed Yes   Shingrix Completed?: No.    Education has been provided regarding the importance of this vaccine. Patient has been advised to call insurance company to determine  out of pocket expense if they have not yet received this vaccine. Advised may also receive vaccine at local pharmacy or Health Dept. Verbalized acceptance and understanding.  Screening Tests Health Maintenance  Topic Date Due  . Hepatitis C Screening  Never done  . COVID-19 Vaccine (3 - Pfizer risk 4-dose series) 08/04/2020  . INFLUENZA VACCINE  02/03/2021 (Originally 06/06/2020)  . TETANUS/TDAP  08/15/2029 (Originally 10/17/2023)  . PNA vac Low Risk Adult  Completed    Health Maintenance  Health Maintenance Due  Topic Date Due  . Hepatitis C Screening  Never done  . COVID-19 Vaccine (3 - Pfizer risk 4-dose series) 08/04/2020    Colorectal cancer screening: No longer required.   Lung Cancer Screening: (Low Dose CT Chest recommended if Age 73-80 years, 30 pack-year currently smoking OR have quit w/in 15years.) does not qualify.   Additional Screening:  Hepatitis C Screening: does qualify; postponed  Vision Screening: Recommended annual ophthalmology exams for early detection of glaucoma and other disorders of the eye. Is the patient up to date with their annual eye exam?  No  Who is the provider or what is the name of the office in which the patient attends annual eye exams? [facility]  Dental Screening: Recommended annual dental exams for proper oral hygiene  Community Resource Referral / Chronic Care Management: CRR required this visit?  No   CCM required this visit?  No      Plan:     I have personally reviewed and noted the following in the patient's chart:   . Medical and social history . Use of alcohol, tobacco or illicit drugs  . Current medications and supplements . Functional ability and status . Nutritional status . Physical activity . Advanced directives . List of other  physicians . Hospitalizations, surgeries, and ER visits in previous 12 months . Vitals . Screenings to include cognitive, depression, and falls . Referrals and appointments  In addition, I have reviewed and discussed with patient certain preventive protocols, quality metrics, and best practice recommendations. A written personalized care plan for preventive services as well as general preventive health recommendations were provided to patient.     Reather Littler, LPN   05/10/9162   Nurse Notes: pt c/o ongoing back pain but states it is slightly improving and tries to stay moving and avoid stiffness. Pt has decreased some of his medication and plans to discuss with Dr. Carlynn Purl at appt on 12/10/20. Notes made on med list for updates. Pt reports good blood pressures and feeling better on lower dose.

## 2020-12-09 NOTE — Progress Notes (Signed)
Name: Dennis Ford   MRN: [id number]    DOB: [birth year]   Date:12/10/2020       Progress Note  Subjective  Virtual Visit via Telephone Note  I connected with Dennis Ford on 12/10/20 at 10:40 AM EST by telephone and verified that I am speaking with the correct person using two identifiers.  Location: Patient: at home  Provider: [facility]   I discussed the limitations, risks, security and privacy concerns of performing an evaluation and management service by telephone and the availability of in person appointments. I also discussed with the patient that there may be a patient responsible charge related to this service. The patient expressed understanding and agreed to proceed.    Chief Complaint  Follow Up  HPI  COVID-19:suspected. He woke up this am with a headache and nasal congestion, He denies body ache, fatigue, sob, wheezing or cough. He will come in at 3 pm to have it swab  HTN: he used to take norvasc but stopped years ago because developed angioedema years ago, he was also given carvedilol and hydralazine but he stops when he feels like it is not working, he is still taking Benazepril and states is what works the best for him. He states his bp goes up and down. Yesterday it was 130/80. No chest pain, palpitation , denies SOB  Goiter:he has not been back to see endo, reviewed previous US and showed goiter, , he is taking levothyroxine 75 mcg daily - not taking half on Sunday as prescribed , his last TSH was at goal. We will recheck next visit   Gout: he states only takes allopurinol prn, we will stop medication and switch to prn colchicine, no episodes in months   Hyperglycemia: A1C at [facility] was 5.4% in [id number] , denies polyphagia, polydipsiaor polyuria . Last fasting glucose was normal   Iron deficiency anemia: he had GI bleed in Nov 2019,  Last ferritin was still low but no anemia,discussed taking ferrous supplementation. He does not want to go back to GI   Leucopenia:  discussed last labs, we will recheck labs next visit   Back pain: we gave him tizanidine and lyrica, he stopped medication, he states he is feeling well. He is diong morning stretching exercises and has helped   Urinary frequency: he has noticed increase in urinary frequency and nocturia and is going for about one year, no hematuria, dysuria. He states usually worse in am, after taking bp medication and drinking his coffee.He stopped taking flomax .   Patient Active Problem List   Diagnosis Date Noted  . Iron deficiency anemia 07/07/2019  . History of TIAs 12/10/2018  . HLD (hyperlipidemia) 11/02/2018  . Intermittent low back pain 12/20/2016  . Hyperglycemia 06/01/2016  . Hypertension, benign 05/31/2016  . 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 Abnormal TSH 06/04/2015    Past Surgical History:  Procedure Laterality Date  . COLONOSCOPY    . CYST EXCISION Left 2008   Upper Back on Left Shoulder  . ESOPHAGOGASTRODUODENOSCOPY (EGD) WITH PROPOFOL N/A 11/02/2018   Procedure: ESOPHAGOGASTRODUODENOSCOPY (EGD) WITH PROPOFOL;  Surgeon: Midge Minium, MD;  Location: [facility] ENDOSCOPY;  Service: Endoscopy;  Laterality: N/A;  . HERNIA REPAIR     LIH   . INGUINAL HERNIA REPAIR  11/27/2011   Procedure: HERNIA REPAIR INGUINAL ADULT;  Surgeon: Jetty Duhamel, MD;  Location: [facility];  Service: General;  Laterality: Right;  right inguinal hernia repair with mesh    Family History  Problem Relation  Age of Onset  . Breast cancer Mother     Social History   Tobacco Use  . Smoking status: Former Smoker    Types: Cigars    Quit date: 11/22/1988    Years since quitting: 32.0  . Smokeless tobacco: Never Used  Substance Use Topics  . Alcohol use: No    Comment: he quit 10/2016     Current Outpatient Medications:  .  allopurinol (ZYLOPRIM) 100 MG tablet, Take 1 tablet (100 mg total) by mouth 2 (two) times daily., Disp: 60 tablet, Rfl: 6 .  benazepril (LOTENSIN) 20 MG tablet, Take 1  tablet (20 mg total) by mouth daily. TAKE 1 TABLET BY MOUTH IN THE MORNING AND 1/2 TABLET IN EVENING, Disp: 90 tablet, Rfl: 1 .  levothyroxine (SYNTHROID) 75 MCG tablet, TAKE 1 TABLET (75 MCG TOTAL) BY MOUTH DAILY BEFORE BREAKFAST. ONE DAILY AND HALF ON SUNDAYS, Disp: 90 tablet, Rfl: 3 .  tamsulosin (FLOMAX) 0.4 MG CAPS capsule, TAKE 1 CAPSULE BY MOUTH EVERY DAY, Disp: 90 capsule, Rfl: 0 .  tiZANidine (ZANAFLEX) 2 MG tablet, Take 1 tablet (2 mg total) by mouth every 6 (six) hours as needed for muscle spasms., Disp: 30 tablet, Rfl: 0 .  chlorthalidone (HYGROTON) 25 MG tablet, Take 1 tablet (25 mg total) by mouth daily. (Patient not taking: No sig reported), Disp: 90 tablet, Rfl: 1 .  pregabalin (LYRICA) 50 MG capsule, Take 1 capsule (50 mg total) by mouth 3 (three) times daily. Start at night and go up to three times a day if tolerated (Patient not taking: No sig reported), Disp: 90 capsule, Rfl: 0 .  triamcinolone (KENALOG) 0.025 % cream, Apply 1 application topically 2 (two) times daily as needed (for inflammation/itching). (Patient not taking: No sig reported), Disp: 80 g, Rfl: 0  Allergies  Allergen Reactions  . Aspirin     GI bleed   . Norvasc [Amlodipine] Other (See Comments)    angioedema  . Statins     Per patient GI bleed and upset stomach     I personally reviewed active problem list, medication list, allergies, family history, social history, health maintenance with the patient/caregiver today.   ROS  Constitutional: Negative for fever or weight change.  Respiratory: Negative for cough and shortness of breath.   Cardiovascular: Negative for chest pain or palpitations.  Gastrointestinal: Negative for abdominal pain, no bowel changes.  Musculoskeletal: Negative for gait problem or joint swelling.  Skin: Negative for rash.  Neurological: Negative for dizziness or headache.  No other specific complaints in a complete review of systems (except as listed in HPI  above).  Objective  Vitals:   12/10/20 1048  Pulse: 100  Resp: 16  Temp: 98 F (36.7 C)  TempSrc: Oral  SpO2: 98%  Weight: 179 lb (81.2 kg)  Height: 5\' 8"  (1.727 m)    Body mass index is 27.22 kg/m.  Physical Exam   Awake, alert and oriented  PHQ2/9: Depression screen [facility] 2/9 12/10/2020 12/07/2020 04/16/2020 08/07/2019 07/09/2019  Decreased Interest 0 0 0 0 0  Down, Depressed, Hopeless 0 0 0 0 0  PHQ - 2 Score 0 0 0 0 0  Altered sleeping - - 0 - 0  Tired, decreased energy - - 0 - 0  Change in appetite - - 0 - 0  Feeling bad or failure about yourself  - - 0 - 0  Trouble concentrating - - 0 - 0  Moving slowly or fidgety/restless - - 0 - 0  Suicidal thoughts - - 0 - 0  PHQ-9 Score - - 0 - 0  Difficult doing work/chores - - - - -    phq 9 is negative   Fall Risk: Fall Risk  12/10/2020 12/07/2020 04/16/2020 08/07/2019 07/09/2019  Falls in the past year? 0 0 0 0 0  Number falls in past yr: 0 0 0 0 0  Injury with Fall? 0 0 0 0 0  Risk for fall due to : - No Fall Risks - - -  Follow up - Falls prevention discussed - Falls prevention discussed -     Assessment & Plan  1. Chronic right-sided low back pain with right-sided sciatica   2. Uncontrolled hypertension  - benazepril (LOTENSIN) 20 MG tablet; Take 1 tablet (20 mg total) by mouth 2 (two) times daily.  Dispense: 180 tablet; Refill: 1  3. Goiter   4. Controlled gout  - colchicine 0.6 MG tablet; Take 1 tablet (0.6 mg total) by mouth daily as needed. May take two pills two hours apart if needed for gout attack  Dispense: 30 tablet; Refill: 0  5. History of TIA (transient ischemic attack)   6. Lower urinary tract symptoms (LUTS)  Discussed rectal exam next visit and PSA  7. Other neutropenia (HCC)  Recheck labs  8. Metabolic syndrome   9. Nasal congestion  - Novel Coronavirus, NAA ([facility])  I discussed the assessment and treatment plan with the patient. The patient was provided an opportunity to ask  questions and all were answered. The patient agreed with the plan and demonstrated an understanding of the instructions.   The patient was advised to call back or seek an in-person evaluation if the symptoms worsen or if the condition fails to improve as anticipated.  I provided 25 minutes of non-face-to-face time during this encounter.   Ruel Favors, MD

## 2020-12-10 ENCOUNTER — Ambulatory Visit (INDEPENDENT_AMBULATORY_CARE_PROVIDER_SITE_OTHER): Payer: Medicare Other | Admitting: Family Medicine

## 2020-12-10 ENCOUNTER — Encounter: Payer: Self-pay | Admitting: Family Medicine

## 2020-12-10 ENCOUNTER — Other Ambulatory Visit: Payer: Self-pay

## 2020-12-10 VITALS — HR 100 | Temp 98.0°F | Resp 16 | Ht 68.0 in | Wt 179.0 lb

## 2020-12-10 DIAGNOSIS — D708 Other neutropenia: Secondary | ICD-10-CM

## 2020-12-10 DIAGNOSIS — R399 Unspecified symptoms and signs involving the genitourinary system: Secondary | ICD-10-CM

## 2020-12-10 DIAGNOSIS — R0981 Nasal congestion: Secondary | ICD-10-CM

## 2020-12-10 DIAGNOSIS — G8929 Other chronic pain: Secondary | ICD-10-CM

## 2020-12-10 DIAGNOSIS — Z8673 Personal history of transient ischemic attack (TIA), and cerebral infarction without residual deficits: Secondary | ICD-10-CM | POA: Diagnosis not present

## 2020-12-10 DIAGNOSIS — M109 Gout, unspecified: Secondary | ICD-10-CM | POA: Diagnosis not present

## 2020-12-10 DIAGNOSIS — I10 Essential (primary) hypertension: Secondary | ICD-10-CM

## 2020-12-10 DIAGNOSIS — E049 Nontoxic goiter, unspecified: Secondary | ICD-10-CM

## 2020-12-10 DIAGNOSIS — M5441 Lumbago with sciatica, right side: Secondary | ICD-10-CM

## 2020-12-10 DIAGNOSIS — E8881 Metabolic syndrome: Secondary | ICD-10-CM

## 2020-12-10 MED ORDER — BENAZEPRIL HCL 20 MG PO TABS: 20.0000 mg | ORAL_TABLET | Freq: Two times a day (BID) | ORAL | 1 refills | Status: DC

## 2020-12-10 MED ORDER — COLCHICINE 0.6 MG PO TABS: 0.6000 mg | ORAL_TABLET | Freq: Every day | ORAL | 0 refills | Status: DC | PRN

## 2020-12-13 DIAGNOSIS — R0981 Nasal congestion: Secondary | ICD-10-CM | POA: Diagnosis not present

## 2020-12-14 LAB — SARS-COV-2, NAA 2 DAY TAT

## 2020-12-14 LAB — NOVEL CORONAVIRUS, NAA: SARS-CoV-2, NAA: NOT DETECTED

## 2020-12-14 LAB — SPECIMEN STATUS REPORT

## 2021-02-03 ENCOUNTER — Other Ambulatory Visit: Payer: Self-pay | Admitting: Family Medicine

## 2021-02-03 DIAGNOSIS — R399 Unspecified symptoms and signs involving the genitourinary system: Secondary | ICD-10-CM

## 2021-02-03 NOTE — Telephone Encounter (Signed)
Requested medications are due for refill today.  unknown  Requested medications are on the active medications list.  no  Last refill. Unknown  Future visit scheduled.   Yes  Notes to clinic.  Medication was d/c 12/10/2020

## 2021-02-25 ENCOUNTER — Other Ambulatory Visit: Payer: Self-pay | Admitting: Family Medicine

## 2021-02-25 DIAGNOSIS — R399 Unspecified symptoms and signs involving the genitourinary system: Secondary | ICD-10-CM

## 2021-02-28 ENCOUNTER — Other Ambulatory Visit: Payer: Self-pay

## 2021-03-07 NOTE — Progress Notes (Signed)
Name: Dennis Ford   MRN: [id number]    DOB: [birth year]   Date:03/09/2021       Progress Note  Subjective  Chief Complaint  Follow Up  HPI   HTN: he used to take norvasc but stopped years ago because developed angioedema, he was also given carvedilol and hydralazine but he stopped it on his own. He is currently taking Benazepril BIC and states is what works the best for him. BP is very high and has been elevated for a while, we will try adding Bystolic now that it is generic   Goiter:he has not been back to see endo, reviewed previous US and showed goiter, , he is taking levothyroxine 75 mcg daily . Denies dry skin or change in bowel movements, no palpitation   Gout: he has been off Allopurinol for months and no gout episodes.   Hyperglycemia:  denies polyphagia, polydipsiabut has urinary frequency. We will recheck A1C  Iron deficiency anemia: he had GI bleed in Nov 2019,  Last ferritin was still low but no anemia,discussed taking ferrous supplementation. He does not want to go back to GI , denies change in color of stools. We will recheck level. We will get a Fecal immunoglobulin test   Leucopenia: discussed last labs, we will recheck labs today   Back pain: it has been going for a while but worse over the past year, he has tried we tizanidine and lyrica, he stopped medication on his own because he was pain free for a while, however seems to be getting worse lately. Starts on right lower quadrant, radiates to his back and is worse when he voids. It shoots down bilateral posterior legs. Denies bowel or bladder incontinence. AUA today was high  The pain does not wake up during the night, aggravated by activity or when voiding. No rashes, no fever, no hematuria   LUTS: going on for a while, initially noticed improvement with flomax, but stopped on his own because it stopped working after a while. Today AUA is very high, discussed possible prostate cancer we will check PSA  IPSS  Questionnaire (AUA-7): Over the past month.   1)  How often have you had a sensation of not emptying your bladder completely after you finish urinating?  4 - More than half the time  2)  How often have you had to urinate again less than two hours after you finished urinating? 4 - More than half the time  3)  How often have you found you stopped and started again several times when you urinated?  3 - About half the time  4) How difficult have you found it to postpone urination?  2 - Less than half the time  5) How often have you had a weak urinary stream?  3 - About half the time  6) How often have you had to push or strain to begin urination?  3 - About half the time  7) How many times did you most typically get up to urinate from the time you went to bed until the time you got up in the morning?  4 - 4 times  Total score:  0-7 mildly symptomatic   8-19 moderately symptomatic   20-35 severely symptomatic     Patient Active Problem List   Diagnosis Date Noted  . Iron deficiency anemia 07/07/2019  . History of TIAs 12/10/2018  . HLD (hyperlipidemia) 11/02/2018  . Intermittent low back pain 12/20/2016  . Hyperglycemia 06/01/2016  . Hypertension, benign  05/31/2016  . 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 Abnormal TSH 06/04/2015    Past Surgical History:  Procedure Laterality Date  . COLONOSCOPY    . CYST EXCISION Left 2008   Upper Back on Left Shoulder  . ESOPHAGOGASTRODUODENOSCOPY (EGD) WITH PROPOFOL N/A 11/02/2018   Procedure: ESOPHAGOGASTRODUODENOSCOPY (EGD) WITH PROPOFOL;  Surgeon: Midge Minium, MD;  Location: [facility] ENDOSCOPY;  Service: Endoscopy;  Laterality: N/A;  . HERNIA REPAIR     LIH   . INGUINAL HERNIA REPAIR  11/27/2011   Procedure: HERNIA REPAIR INGUINAL ADULT;  Surgeon: Jetty Duhamel, MD;  Location: [facility];  Service: General;  Laterality: Right;  right inguinal hernia repair with mesh    Family History  Problem Relation Age of Onset  . Breast cancer  Mother     Social History   Tobacco Use  . Smoking status: Former Smoker    Types: Cigars    Quit date: 11/22/1988    Years since quitting: 32.3  . Smokeless tobacco: Never Used  Substance Use Topics  . Alcohol use: No    Comment: he quit 10/2016     Current Outpatient Medications:  .  benazepril (LOTENSIN) 20 MG tablet, Take 1 tablet (20 mg total) by mouth 2 (two) times daily., Disp: 180 tablet, Rfl: 1 .  colchicine 0.6 MG tablet, Take 1 tablet (0.6 mg total) by mouth daily as needed. May take two pills two hours apart if needed for gout attack, Disp: 30 tablet, Rfl: 0 .  levothyroxine (SYNTHROID) 75 MCG tablet, TAKE 1 TABLET (75 MCG TOTAL) BY MOUTH DAILY BEFORE BREAKFAST. ONE DAILY AND HALF ON SUNDAYS, Disp: 90 tablet, Rfl: 3 .  nebivolol (BYSTOLIC) 10 MG tablet, Take 1 tablet (10 mg total) by mouth daily., Disp: 90 tablet, Rfl: 0 .  triamcinolone cream (KENALOG) 0.1 %, Apply 1 application topically 2 (two) times daily., Disp: 30 g, Rfl: 0  Allergies  Allergen Reactions  . Aspirin     GI bleed   . Norvasc [Amlodipine] Other (See Comments)    angioedema  . Statins     Per patient GI bleed and upset stomach     I personally reviewed active problem list, medication list, allergies, family history, social history, health maintenance with the patient/caregiver today.   ROS  Constitutional: Negative for fever or weight change.  Respiratory: Negative for cough and shortness of breath.   Cardiovascular: Negative for chest pain or palpitations.  Gastrointestinal: Negative for abdominal pain, no bowel changes.  Musculoskeletal: Negative for gait problem or joint swelling.  Skin: Negative for rash.  Neurological: Negative for dizziness or headache.  No other specific complaints in a complete review of systems (except as listed in HPI above).  Objective  Vitals:   03/09/21 1408  BP: (!) 220/102  Pulse: 91  Resp: 16  Temp: 98.1 F (36.7 C)  SpO2: 99%  Weight: 178 lb 9.6  oz (81 kg)  Height: 5\' 8"  (1.727 m)    Body mass index is 27.16 kg/m.  Physical Exam  Constitutional: Patient appears well-developed and well-nourished. No distress.  HEENT: head atraumatic, normocephalic, pupils equal and reactive to light,  neck supple Cardiovascular: Normal rate, regular rhythm and normal heart sounds.  No murmur heard. No BLE edema. Pulmonary/Chest: Effort normal and breath sounds normal. No respiratory distress. Abdominal: Soft.  There is no tenderness. Muscular skeletal: normal rom of back, negative straight leg raise, no pain during palpation of lumbar spine Psychiatric: Patient has a normal mood  and affect. behavior is normal. Judgment and thought content normal.  Recent Results (from the past 2160 hour(s))  Novel Coronavirus, NAA ([facility])     Status: None   Collection Time: 12/13/20 12:00 AM   Specimen: Nasopharyngeal(NP) swabs in vial transport medium   Nasopharynge  Result Value Ref Range   SARS-CoV-2, NAA Not Detected Not Detected    Comment: This nucleic acid amplification test was developed and its performance characteristics determined by [employer]. Nucleic acid amplification tests include RT-PCR and TMA. This test has not been FDA cleared or approved. This test has been authorized by FDA under an Emergency Use Authorization (EUA). This test is only authorized for the duration of time the declaration that circumstances exist justifying the authorization of the emergency use of in vitro diagnostic tests for detection of SARS-CoV-2 virus and/or diagnosis of COVID-19 infection under section 564(b)(1) of the Act, 21 U.S.C. 941DEY-8(X) (1), unless the authorization is terminated or revoked sooner. When diagnostic testing is negative, the possibility of a false negative result should be considered in the context of a patient's recent exposures and the presence of clinical signs and symptoms consistent with COVID-19. An individual without  symptoms of COVID-19 and who is not shedding SARS-CoV-2 virus wo uld expect to have a negative (not detected) result in this assay.   SARS-COV-2, NAA 2 DAY TAT     Status: None   Collection Time: 12/13/20 12:00 AM   Nasopharynge  Result Value Ref Range   SARS-CoV-2, NAA 2 DAY TAT Performed   Specimen status report     Status: None   Collection Time: 12/13/20 12:00 AM  Result Value Ref Range   specimen status report Comment     Comment: Please note Please note The date and/or time of collection was not indicated on the requisition as required by state and federal law.  The date of receipt of the specimen was used as the collection date if not supplied.       PHQ2/9: Depression screen [facility] 2/9 03/09/2021 12/10/2020 12/07/2020 04/16/2020 08/07/2019  Decreased Interest 0 0 0 0 0  Down, Depressed, Hopeless 0 0 0 0 0  PHQ - 2 Score 0 0 0 0 0  Altered sleeping 0 - - 0 -  Tired, decreased energy 0 - - 0 -  Change in appetite 0 - - 0 -  Feeling bad or failure about yourself  0 - - 0 -  Trouble concentrating 0 - - 0 -  Moving slowly or fidgety/restless 0 - - 0 -  Suicidal thoughts 0 - - 0 -  PHQ-9 Score 0 - - 0 -  Difficult doing work/chores Not difficult at all - - - -  Some recent data might be hidden    phq 9 is negative   Fall Risk: Fall Risk  03/09/2021 12/10/2020 12/07/2020 04/16/2020 08/07/2019  Falls in the past year? 0 0 0 0 0  Number falls in past yr: 0 0 0 0 0  Injury with Fall? 0 0 0 0 0  Risk for fall due to : - - No Fall Risks - -  Follow up - - Falls prevention discussed - Falls prevention discussed     Assessment & Plan  1. Uncontrolled hypertension  - COMPLETE METABOLIC PANEL WITH GFR - Microalbumin / creatinine urine ratio - nebivolol (BYSTOLIC) 10 MG tablet; Take 1 tablet (10 mg total) by mouth daily.  Dispense: 90 tablet; Refill: 0  2. History of TIA (transient ischemic attack)  -  Lipid panel  3. Controlled gout   4. Other neutropenia (HCC)  - CBC with  Differential/Platelet  5. Metabolic syndrome   6. Lower urinary tract symptoms (LUTS)  - PSA  7. Acquired hypothyroidism  - TSH  8. Chronic right-sided low back pain with bilateral sciatica  - DG Lumbar Spine Complete; Future  9. Need for hepatitis C screening test  Hepatitis C screen   10. Iron deficiency anemia, unspecified iron deficiency anemia type  - Iron, TIBC and Ferritin Panel - Fecal Globin By Immunochemistry  11. Hyperglycemia  - Hemoglobin A1c  12. Allergic contact dermatitis due to other agents  - triamcinolone cream (KENALOG) 0.1 %; Apply 1 application topically 2 (two) times daily.  Dispense: 30 g; Refill: 0

## 2021-03-09 ENCOUNTER — Encounter: Payer: Self-pay | Admitting: Family Medicine

## 2021-03-09 ENCOUNTER — Other Ambulatory Visit: Payer: Self-pay

## 2021-03-09 ENCOUNTER — Ambulatory Visit (INDEPENDENT_AMBULATORY_CARE_PROVIDER_SITE_OTHER): Payer: Medicare Other | Admitting: Family Medicine

## 2021-03-09 VITALS — BP 210/100 | HR 91 | Temp 98.1°F | Resp 16 | Ht 68.0 in | Wt 178.6 lb

## 2021-03-09 DIAGNOSIS — D509 Iron deficiency anemia, unspecified: Secondary | ICD-10-CM | POA: Diagnosis not present

## 2021-03-09 DIAGNOSIS — R739 Hyperglycemia, unspecified: Secondary | ICD-10-CM | POA: Diagnosis not present

## 2021-03-09 DIAGNOSIS — D708 Other neutropenia: Secondary | ICD-10-CM

## 2021-03-09 DIAGNOSIS — Z8673 Personal history of transient ischemic attack (TIA), and cerebral infarction without residual deficits: Secondary | ICD-10-CM | POA: Diagnosis not present

## 2021-03-09 DIAGNOSIS — R350 Frequency of micturition: Secondary | ICD-10-CM

## 2021-03-09 DIAGNOSIS — Z1159 Encounter for screening for other viral diseases: Secondary | ICD-10-CM | POA: Diagnosis not present

## 2021-03-09 DIAGNOSIS — I10 Essential (primary) hypertension: Secondary | ICD-10-CM

## 2021-03-09 DIAGNOSIS — L2389 Allergic contact dermatitis due to other agents: Secondary | ICD-10-CM

## 2021-03-09 DIAGNOSIS — M5442 Lumbago with sciatica, left side: Secondary | ICD-10-CM

## 2021-03-09 DIAGNOSIS — M109 Gout, unspecified: Secondary | ICD-10-CM | POA: Diagnosis not present

## 2021-03-09 DIAGNOSIS — R399 Unspecified symptoms and signs involving the genitourinary system: Secondary | ICD-10-CM

## 2021-03-09 DIAGNOSIS — M5441 Lumbago with sciatica, right side: Secondary | ICD-10-CM | POA: Diagnosis not present

## 2021-03-09 DIAGNOSIS — E039 Hypothyroidism, unspecified: Secondary | ICD-10-CM | POA: Diagnosis not present

## 2021-03-09 DIAGNOSIS — E8881 Metabolic syndrome: Secondary | ICD-10-CM

## 2021-03-09 DIAGNOSIS — G8929 Other chronic pain: Secondary | ICD-10-CM

## 2021-03-09 MED ORDER — TRIAMCINOLONE ACETONIDE 0.1 % EX CREA: 1.0000 "application " | TOPICAL_CREAM | Freq: Two times a day (BID) | CUTANEOUS | 0 refills | Status: DC

## 2021-03-09 MED ORDER — NEBIVOLOL HCL 10 MG PO TABS: 10.0000 mg | ORAL_TABLET | Freq: Every day | ORAL | 0 refills | Status: DC

## 2021-03-10 ENCOUNTER — Telehealth: Payer: Self-pay

## 2021-03-10 NOTE — Telephone Encounter (Signed)
Copied from CRM [id number]. Topic: General - Other >> Mar 10, 2021  2:09 PM Pawlus, Maxine Glenn A wrote: Reason for CRM: Pt called in stating nebivolol (BYSTOLIC) 10 MG tablet was too expensive, pt wanted to know if there was a more cost effective option / generic. Please advise.

## 2021-03-11 NOTE — Telephone Encounter (Signed)
Printed coupon, Pt will pick up.

## 2021-03-14 ENCOUNTER — Other Ambulatory Visit: Payer: Self-pay | Admitting: Family Medicine

## 2021-03-14 DIAGNOSIS — D509 Iron deficiency anemia, unspecified: Secondary | ICD-10-CM

## 2021-03-14 LAB — HEPATITIS C ANTIBODY
Hepatitis C Ab: NONREACTIVE
SIGNAL TO CUT-OFF: 0.02 (ref <1.00)

## 2021-03-14 LAB — CBC WITH DIFFERENTIAL/PLATELET
Absolute Monocytes: 403 cells/uL (ref 200–950)
Basophils Absolute: 50 cells/uL (ref 0–200)
Basophils Relative: 1.5 %
Eosinophils Absolute: 30 cells/uL (ref 15–500)
Eosinophils Relative: 0.9 %
HCT: 48.5 % (ref 38.5–50.0)
Hemoglobin: 14.7 g/dL (ref 13.2–17.1)
Lymphs Abs: 1158 cells/uL (ref 850–3900)
MCH: 25.3 pg — ABNORMAL LOW (ref 27.0–33.0)
MCHC: 30.3 g/dL — ABNORMAL LOW (ref 32.0–36.0)
MCV: 83.3 fL (ref 80.0–100.0)
MPV: 11 fL (ref 7.5–12.5)
Monocytes Relative: 12.2 %
Neutro Abs: 1660 cells/uL (ref 1500–7800)
Neutrophils Relative %: 50.3 %
Platelets: 318 10*3/uL (ref 140–400)
RBC: 5.82 10*6/uL — ABNORMAL HIGH (ref 4.20–5.80)
RDW: 15.3 % — ABNORMAL HIGH (ref 11.0–15.0)
Total Lymphocyte: 35.1 %
WBC: 3.3 10*3/uL — ABNORMAL LOW (ref 3.8–10.8)

## 2021-03-14 LAB — MICROALBUMIN / CREATININE URINE RATIO
Creatinine, Urine: 99 mg/dL (ref 20–320)
Microalb Creat Ratio: 23 mcg/mg creat (ref <30)
Microalb, Ur: 2.3 mg/dL

## 2021-03-14 LAB — LIPID PANEL
Cholesterol: 175 mg/dL (ref <200)
HDL: 54 mg/dL (ref >=40)
LDL Cholesterol (Calc): 100 mg/dL (calc) — ABNORMAL HIGH
Non-HDL Cholesterol (Calc): 121 mg/dL (calc) (ref <130)
Total CHOL/HDL Ratio: 3.2 (calc) (ref <5.0)
Triglycerides: 112 mg/dL (ref <150)

## 2021-03-14 LAB — IRON,TIBC AND FERRITIN PANEL
%SAT: 15 % (calc) — ABNORMAL LOW (ref 20–48)
Ferritin: 15 ng/mL — ABNORMAL LOW (ref 24–380)
Iron: 49 ug/dL — ABNORMAL LOW (ref 50–180)
TIBC: 337 mcg/dL (calc) (ref 250–425)

## 2021-03-14 LAB — HEMOGLOBIN A1C
Hgb A1c MFr Bld: 5.5 % of total Hgb (ref <5.7)
Mean Plasma Glucose: 111 mg/dL
eAG (mmol/L): 6.2 mmol/L

## 2021-03-14 LAB — URINALYSIS
Bilirubin Urine: NEGATIVE
Glucose, UA: NEGATIVE
Hgb urine dipstick: NEGATIVE
Ketones, ur: NEGATIVE
Nitrite: NEGATIVE
Protein, ur: NEGATIVE
Specific Gravity, Urine: 1.012 (ref 1.001–1.035)
pH: 5.5 (ref 5.0–8.0)

## 2021-03-14 LAB — TSH: TSH: 3.45 mIU/L (ref 0.40–4.50)

## 2021-03-14 LAB — COMPLETE METABOLIC PANEL WITH GFR
AG Ratio: 1.3 (calc) (ref 1.0–2.5)
ALT: 9 U/L (ref 9–46)
AST: 14 U/L (ref 10–35)
Albumin: 4.2 g/dL (ref 3.6–5.1)
Alkaline phosphatase (APISO): 45 U/L (ref 35–144)
BUN: 14 mg/dL (ref 7–25)
CO2: 23 mmol/L (ref 20–32)
Calcium: 9.6 mg/dL (ref 8.6–10.3)
Chloride: 105 mmol/L (ref 98–110)
Creat: 1.02 mg/dL (ref 0.70–1.18)
GFR, Est African American: 81 mL/min/{1.73_m2} (ref >=60)
GFR, Est Non African American: 70 mL/min/{1.73_m2} (ref >=60)
Globulin: 3.2 g/dL (calc) (ref 1.9–3.7)
Glucose, Bld: 93 mg/dL (ref 65–99)
Potassium: 4.5 mmol/L (ref 3.5–5.3)
Sodium: 139 mmol/L (ref 135–146)
Total Bilirubin: 0.4 mg/dL (ref 0.2–1.2)
Total Protein: 7.4 g/dL (ref 6.1–8.1)

## 2021-03-14 LAB — CULTURE, URINE COMPREHENSIVE
MICRO NUMBER:: 11853985
SPECIMEN QUALITY:: ADEQUATE

## 2021-03-14 LAB — PSA: PSA: 5.04 ng/mL — ABNORMAL HIGH (ref <=4.00)

## 2021-03-16 DIAGNOSIS — D708 Other neutropenia: Secondary | ICD-10-CM | POA: Diagnosis not present

## 2021-03-16 DIAGNOSIS — E039 Hypothyroidism, unspecified: Secondary | ICD-10-CM | POA: Diagnosis not present

## 2021-03-16 DIAGNOSIS — I10 Essential (primary) hypertension: Secondary | ICD-10-CM | POA: Diagnosis not present

## 2021-03-16 DIAGNOSIS — D509 Iron deficiency anemia, unspecified: Secondary | ICD-10-CM | POA: Diagnosis not present

## 2021-03-16 LAB — FECAL OCCULT BLOOD, GUAIAC: Fecal Occult Blood: NEGATIVE

## 2021-03-22 LAB — FECAL GLOBIN BY IMMUNOCHEMISTRY
FECAL GLOBIN RESULT:: NOT DETECTED
MICRO NUMBER:: 11894004
SPECIMEN QUALITY:: ADEQUATE

## 2021-04-05 ENCOUNTER — Telehealth: Payer: Self-pay | Admitting: Family Medicine

## 2021-04-05 NOTE — Telephone Encounter (Signed)
nebivolol (BYSTOLIC) 10 MG tablet Pt called and stated that this medication has been helping his BP stay more stable than the Benazepril but the cost for a 30 day supply is about $40 / Pt stated he only took 30 day supply due to the cost of the 90 day supply sent/ Pt is asking if they is anything equivalent but with a lower cost? / pt asked if not should he go back to taking the Benazepril/ please advise

## 2021-04-06 ENCOUNTER — Other Ambulatory Visit: Payer: Self-pay | Admitting: Family Medicine

## 2021-04-06 DIAGNOSIS — I10 Essential (primary) hypertension: Secondary | ICD-10-CM

## 2021-04-06 MED ORDER — NEBIVOLOL HCL 10 MG PO TABS
10.0000 mg | ORAL_TABLET | Freq: Every day | ORAL | 0 refills | Status: DC
Start: 2021-04-06 — End: 2021-08-25

## 2021-04-18 ENCOUNTER — Ambulatory Visit: Payer: Self-pay | Admitting: *Deleted

## 2021-04-18 NOTE — Telephone Encounter (Signed)
Spoke with patient and he agreed to stop taking the Bystolic and resume Lotensin. He is scheduled for Weds,  04/20/2021 and will have BP check at that time as well.

## 2021-04-18 NOTE — Telephone Encounter (Signed)
Reason for Disposition  [1] Caller has URGENT medicine question about med that PCP or specialist prescribed AND [2] triager unable to answer question    Bystolic and Lotension  Answer Assessment - Initial Assessment Questions 1. NAME of MEDICATION: "What medicine are you calling about?"     Bystoiic and I don't have any energy and I'm tired all the time.   Lotensin for my BP did not do that to me. If I walk to my mailbox I'm give out of breath 2. QUESTION: "What is your question?" (e.g., double dose of medicine, side effect)     Bystolic is not agreeing.   3. PRESCRIBING HCP: "Who prescribed it?" Reason: if prescribed by specialist, call should be referred to that group.     Dr. Carlynn Purl 4. SYMPTOMS: "Do you have any symptoms?"     Severe tired and out of breath since starting the new medicine. 5. SEVERITY: If symptoms are present, ask "Are they mild, moderate or severe?"     Severe 6. PREGNANCY:  "Is there any chance that you are pregnant?" "When was your last menstrual period?"     N/A  Protocols used: Medication Question Call-A-AH

## 2021-04-18 NOTE — Telephone Encounter (Signed)
Pt calling in c/o his Bystolic 10 mg making him feel very tired and out of breath with minimal activity.   He noticed this after starting the Bystolic.   He is not taking the Lotension 20 mg.   "She took me off of that and put me on Bystolic because my BP was too high".     He was seen by Dr. Carlynn Purl on 03/09/2021 and the medication Bystolic 10 mg started.  I let him know I would send Dr. Carlynn Purl a note and let her know about how he is feeling since starting the Bystolic.   Someone would be calling him back.   He was agreeable to this plan.  (In looking in his chart after getting off the phone with him, it looks like he should still be taking the Lotensin 20 mg and that the Bystolic was added.   He is not taking the Lotensin because he asked if he could resume taking it since it would help keep his BP down better than nothing at all.  It did not make him feel tired and out of breath).  It looks like he needs clarification on the Lotensin instructions.

## 2021-04-19 NOTE — Progress Notes (Signed)
Name: Dennis Ford   MRN: [id number]    DOB: 04/18/1941   Date:04/20/2021       Progress Note  Subjective  Chief Complaint  Follow up   HPI  HTN: he used to take norvasc but stopped years ago because developed angioedema, he was also given carvedilol and hydralazine but he stopped it on his own. During his last visit we added Byslolic 10 mg to benazepril 20 mg BID, but he has only been taking Bystolic half pill twice daily and not benazepril. He states Bystolic has been working well but makes him feel tired when he takes full dose in am. We will try benazepril in am and bystolic at night. Also explained that may be secondary to bp finally getting closer to normal range.   Elevated PSA: he is 80 yo, discussed referral to Urologist or recheck in 6 months and he chose the later  Iron deficiency: negative fecal immnoessay. He also has stable leucopenia, does not want to see another doctor at this time, but willing to take iron pills.   Patient Active Problem List   Diagnosis Date Noted   Iron deficiency anemia 07/07/2019   History of TIAs 12/10/2018   HLD (hyperlipidemia) 11/02/2018   Intermittent low back pain 12/20/2016   Hyperglycemia 06/01/2016   Hypertension, benign 05/31/2016   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   Abnormal TSH 06/04/2015    Past Surgical History:  Procedure Laterality Date   COLONOSCOPY     CYST EXCISION Left 2008   Upper Back on Left Shoulder   ESOPHAGOGASTRODUODENOSCOPY (EGD) WITH PROPOFOL N/A 11/02/2018   Procedure: ESOPHAGOGASTRODUODENOSCOPY (EGD) WITH PROPOFOL;  Surgeon: Midge Minium, MD;  Location: [facility];  Service: Endoscopy;  Laterality: N/A;   HERNIA REPAIR     LI[facility]    INGUINAL HERNIA REPAIR  11/27/2011   Procedure: HERNIA REPAIR INGUINAL ADULT;  Surgeon: Cherylynn Ridges III, MD;  Location: [facility];  Service: General;  Laterality: Right;  right inguinal hernia repair with mesh    Family History  Problem Relation Age of Onset   Breast  cancer Mother     Social History   Tobacco Use   Smoking status: Former    Pack years: 0.00    Types: Cigars    Quit date: 11/22/1988    Years since quitting: 32.4   Smokeless tobacco: Never  Substance Use Topics   Alcohol use: No    Comment: he quit 10/2016     Current Outpatient Medications:    benazepril (LOTENSIN) 20 MG tablet, Take 1 tablet (20 mg total) by mouth 2 (two) times daily., Disp: 180 tablet, Rfl: 1   levothyroxine (SYNTHROID) 75 MCG tablet, TAKE 1 TABLET (75 MCG TOTAL) BY MOUTH DAILY BEFORE BREAKFAST. ONE DAILY AND HALF ON SUNDAYS, Disp: 90 tablet, Rfl: 3   nebivolol (BYSTOLIC) 10 MG tablet, Take 1 tablet (10 mg total) by mouth daily., Disp: 90 tablet, Rfl: 0   triamcinolone cream (KENALOG) 0.1 %, Apply 1 application topically 2 (two) times daily., Disp: 30 g, Rfl: 0   colchicine 0.6 MG tablet, Take 1 tablet (0.6 mg total) by mouth daily as needed. May take two pills two hours apart if needed for gout attack (Patient not taking: Reported on 04/20/2021), Disp: 30 tablet, Rfl: 0  Allergies  Allergen Reactions   Aspirin     GI bleed    Norvasc [Amlodipine] Other (See Comments)    angioedema   Statins     Per patient GI bleed  and upset stomach     I personally reviewed active problem list, medication list, allergies, family history, social history, health maintenance with the patient/caregiver today.   ROS  Ten systems reviewed and is negative except as mentioned in HPI  Objective  Vitals:   04/20/21 1322 04/20/21 1330  BP: (!) 150/100 (!) 148/92  Pulse: 80   Resp: 16   Temp: 98.2 F (36.8 C)   TempSrc: Oral   SpO2: 97%   Weight: 182 lb (82.6 kg)   Height: 5\' 8"  (1.727 m)     Body mass index is 27.67 kg/m.  Physical Exam  Constitutional: Patient appears well-developed and well-nourished. No distress.  HEENT: head atraumatic, normocephalic, pupils equal and reactive to light, neck supple Cardiovascular: Normal rate, regular rhythm and normal  heart sounds.  No murmur heard. No BLE edema. Pulmonary/Chest: Effort normal and breath sounds normal. No respiratory distress. Abdominal: Soft.  There is no tenderness. Psychiatric: Patient has a normal mood and affect. behavior is normal. Judgment and thought content normal.   Recent Results (from the past 2160 hour(s))  Lipid panel     Status: Abnormal   Collection Time: 03/09/21  3:02 PM  Result Value Ref Range   Cholesterol 175 <200 mg/dL   HDL 54 > OR = 40 mg/dL   Triglycerides 161112 <096<150 mg/dL   LDL Cholesterol (Calc) 100 (H) mg/dL (calc)    Comment: Reference range: <100 . Desirable range <100 mg/dL for primary prevention;   <70 mg/dL for patients with CHD or diabetic patients  with > or = 2 CHD risk factors. Marland Kitchen. LDL-C is now calculated using the Martin-Hopkins  calculation, which is a validated novel method providing  better accuracy than the Friedewald equation in the  estimation of LDL-C.  Horald PollenMartin SS et al. Lenox AhrJAMA. 0454;098(112013;310(19): 2061-2068  (http://education.[facility].com/faq/FAQ164)    Total CHOL/HDL Ratio 3.2 <5.0 (calc)   Non-HDL Cholesterol (Calc) 121 <130 mg/dL (calc)    Comment: For patients with diabetes plus 1 major ASCVD risk  factor, treating to a non-HDL-C goal of <100 mg/dL  (LDL-C of <91<70 mg/dL) is considered a therapeutic  option.   CBC with Differential/Platelet     Status: Abnormal   Collection Time: 03/09/21  3:02 PM  Result Value Ref Range   WBC 3.3 (L) 3.8 - 10.8 Thousand/uL   RBC 5.82 (H) 4.20 - 5.80 Million/uL   Hemoglobin 14.7 13.2 - 17.1 g/dL   HCT 47.848.5 29.538.5 - 62.150.0 %   MCV 83.3 80.0 - 100.0 fL   MCH 25.3 (L) 27.0 - 33.0 pg   MCHC 30.3 (L) 32.0 - 36.0 g/dL   RDW 30.815.3 (H) 65.711.0 - 84.615.0 %   Platelets 318 140 - 400 Thousand/uL   MPV 11.0 7.5 - 12.5 fL   Neutro Abs 1,660 1,500 - 7,800 cells/uL   Lymphs Abs 1,158 850 - 3,900 cells/uL   Absolute Monocytes 403 200 - 950 cells/uL   Eosinophils Absolute 30 15 - 500 cells/uL   Basophils Absolute 50  0 - 200 cells/uL   Neutrophils Relative % 50.3 %   Total Lymphocyte 35.1 %   Monocytes Relative 12.2 %   Eosinophils Relative 0.9 %   Basophils Relative 1.5 %  COMPLETE METABOLIC PANEL WITH GFR     Status: None   Collection Time: 03/09/21  3:02 PM  Result Value Ref Range   Glucose, Bld 93 65 - 99 mg/dL    Comment: .  Fasting reference interval .    BUN 14 7 - 25 mg/dL   Creat 3.43 5.68 - 6.16 mg/dL    Comment: For patients >34 years of age, the reference limit for Creatinine is approximately 13% higher for people identified as African-American. .    GFR, Est Non African American 70 > OR = 60 mL/min/1.63m2   GFR, Est African American 81 > OR = 60 mL/min/1.43m2   BUN/Creatinine Ratio NOT APPLICABLE 6 - 22 (calc)   Sodium 139 135 - 146 mmol/L   Potassium 4.5 3.5 - 5.3 mmol/L   Chloride 105 98 - 110 mmol/L   CO2 23 20 - 32 mmol/L   Calcium 9.6 8.6 - 10.3 mg/dL   Total Protein 7.4 6.1 - 8.1 g/dL   Albumin 4.2 3.6 - 5.1 g/dL   Globulin 3.2 1.9 - 3.7 g/dL (calc)   AG Ratio 1.3 1.0 - 2.5 (calc)   Total Bilirubin 0.4 0.2 - 1.2 mg/dL   Alkaline phosphatase (APISO) 45 35 - 144 U/L   AST 14 10 - 35 U/L   ALT 9 9 - 46 U/L  PSA     Status: Abnormal   Collection Time: 03/09/21  3:02 PM  Result Value Ref Range   PSA 5.04 (H) < OR = 4.00 ng/mL    Comment: The total PSA value from this assay system is  standardized against the WHO standard. The test  result will be approximately 20% lower when compared  to the equimolar-standardized total PSA (Beckman  Coulter). Comparison of serial PSA results should be  interpreted with this fact in mind. . This test was performed using the Siemens  chemiluminescent method. Values obtained from  different assay methods cannot be used interchangeably. PSA levels, regardless of value, should not be interpreted as absolute evidence of the presence or absence of disease.   TSH     Status: None   Collection Time: 03/09/21  3:02 PM   Result Value Ref Range   TSH 3.45 0.40 - 4.50 mIU/L  Iron, TIBC and Ferritin Panel     Status: Abnormal   Collection Time: 03/09/21  3:02 PM  Result Value Ref Range   Iron 49 (L) 50 - 180 mcg/dL   TIBC 837 290 - 211 mcg/dL (calc)   %SAT 15 (L) 20 - 48 % (calc)   Ferritin 15 (L) 24 - 380 ng/mL  Microalbumin / creatinine urine ratio     Status: None   Collection Time: 03/09/21  3:02 PM  Result Value Ref Range   Creatinine, Urine 99 20 - 320 mg/dL   Microalb, Ur 2.3 mg/dL    Comment: Reference Range Not established    Microalb Creat Ratio 23 <30 mcg/mg creat    Comment: . The ADA defines abnormalities in albumin excretion as follows: Marland Kitchen Albuminuria Category        Result (mcg/mg creatinine) . Normal to Mildly increased   <30 Moderately increased         30-299  Severely increased           > OR = 300 . The ADA recommends that at least two of three specimens collected within a 3-6 month period be abnormal before considering a patient to be within a diagnostic category.   Hemoglobin A1c     Status: None   Collection Time: 03/09/21  3:02 PM  Result Value Ref Range   Hgb A1c MFr Bld 5.5 <5.7 % of total Hgb    Comment: For  the purpose of screening for the presence of diabetes: . <5.7%       Consistent with the absence of diabetes 5.7-6.4%    Consistent with increased risk for diabetes             (prediabetes) > or =6.5%  Consistent with diabetes . This assay result is consistent with a decreased risk of diabetes. . Currently, no consensus exists regarding use of hemoglobin A1c for diagnosis of diabetes in children. . According to American Diabetes Association (ADA) guidelines, hemoglobin A1c <7.0% represents optimal control in non-pregnant diabetic patients. Different metrics may apply to specific patient populations.  Standards of Medical Care in Diabetes(ADA). .    Mean Plasma Glucose 111 mg/dL   eAG (mmol/L) 6.2 mmol/L  Urinalysis     Status: Abnormal    Collection Time: 03/09/21  3:02 PM  Result Value Ref Range   Color, Urine YELLOW YELLOW   APPearance CLEAR CLEAR   Specific Gravity, Urine 1.012 1.001 - 1.035   pH 5.5 5.0 - 8.0   Glucose, UA NEGATIVE NEGATIVE   Bilirubin Urine NEGATIVE NEGATIVE   Ketones, ur NEGATIVE NEGATIVE   Hgb urine dipstick NEGATIVE NEGATIVE   Protein, ur NEGATIVE NEGATIVE   Nitrite NEGATIVE NEGATIVE   Leukocytes,Ua TRACE (A) NEGATIVE  Hepatitis C antibody     Status: None   Collection Time: 03/09/21  3:02 PM  Result Value Ref Range   Hepatitis C Ab NON-REACTIVE NON-REACTIVE   SIGNAL TO CUT-OFF 0.02 <1.00    Comment: . HCV antibody was non-reactive. There is no laboratory  evidence of HCV infection. . In most cases, no further action is required. However, if recent HCV exposure is suspected, a test for HCV RNA (test code 86578) is suggested. . For additional information please refer to http://education.[facility].com/faq/FAQ22v1 (This link is being provided for informational/ educational purposes only.) .   CULTURE, URINE COMPREHENSIVE     Status: None   Collection Time: 03/09/21  3:02 PM  Result Value Ref Range   MICRO NUMBER: [id number]    SPECIMEN QUALITY: Adequate    Source OTHER (SPECIFY)    STATUS: FINAL    ISOLATE 1:      100-900 CFU/mL of Non-uropathogenic Gram positive organism May represent colonizers from external and internal genitalia. No further testing (including susceptibility) will be performed.  Fecal Globin By Immunochemistry     Status: None   Collection Time: 03/16/21 12:00 AM  Result Value Ref Range   MICRO NUMBER: [id number]    SPECIMEN QUALITY: Adequate    Source: INSURE (TM) FOBT TEST CARD    STATUS: FINAL    FECAL GLOBIN RESULT: Not Detected   Fecal Occult Blood, Guaiac     Status: None   Collection Time: 03/16/21 12:00 AM  Result Value Ref Range   Fecal Occult Blood Negative       PHQ2/9: Depression screen [facility] 2/9 04/20/2021 03/09/2021 12/10/2020 12/07/2020  04/16/2020  Decreased Interest 0 0 0 0 0  Down, Depressed, Hopeless 0 0 0 0 0  PHQ - 2 Score 0 0 0 0 0  Altered sleeping - 0 - - 0  Tired, decreased energy - 0 - - 0  Change in appetite - 0 - - 0  Feeling bad or failure about yourself  - 0 - - 0  Trouble concentrating - 0 - - 0  Moving slowly or fidgety/restless - 0 - - 0  Suicidal thoughts - 0 - - 0  PHQ-9 Score - 0 - -  0  Difficult doing work/chores - Not difficult at all - - -  Some recent data might be hidden    phq 9 is negative   Fall Risk: Fall Risk  04/20/2021 03/09/2021 12/10/2020 12/07/2020 04/16/2020  Falls in the past year? 0 0 0 0 0  Number falls in past yr: 0 0 0 0 0  Injury with Fall? 0 0 0 0 0  Risk for fall due to : - - - No Fall Risks -  Follow up - - - Falls prevention discussed -      Functional Status Survey: Is the patient deaf or have difficulty hearing?: No Does the patient have difficulty seeing, even when wearing glasses/contacts?: No Does the patient have difficulty concentrating, remembering, or making decisions?: No Does the patient have difficulty walking or climbing stairs?: No Does the patient have difficulty dressing or bathing?: No Does the patient have difficulty doing errands alone such as visiting a doctor's office or shopping?: No    Assessment & Plan  1. Iron deficiency anemia, unspecified iron deficiency anemia type  - Iron, Ferrous Sulfate, 325 (65 Fe) MG TABS; Take 325 mg by mouth daily. Take with colace 100 mg daily  Dispense: 100 tablet; Refill: 1  2. Uncontrolled hypertension  - benazepril (LOTENSIN) 20 MG tablet; Take 0.5-1 tablets (10-20 mg total) by mouth in the morning.  Dispense: 90 tablet; Refill: 0  3. Elevated PSA  He does not want to see Urologist at this time, he is willing to have level rechecked, he states he is taking something for prostate but not sure what it is . Advised him to bring all medications  on his next office visit

## 2021-04-20 ENCOUNTER — Ambulatory Visit (INDEPENDENT_AMBULATORY_CARE_PROVIDER_SITE_OTHER): Payer: Medicare Other | Admitting: Family Medicine

## 2021-04-20 ENCOUNTER — Other Ambulatory Visit: Payer: Self-pay

## 2021-04-20 ENCOUNTER — Encounter: Payer: Self-pay | Admitting: Family Medicine

## 2021-04-20 VITALS — BP 148/92 | HR 80 | Temp 98.2°F | Resp 16 | Ht 68.0 in | Wt 182.0 lb

## 2021-04-20 DIAGNOSIS — R972 Elevated prostate specific antigen [PSA]: Secondary | ICD-10-CM | POA: Diagnosis not present

## 2021-04-20 DIAGNOSIS — D509 Iron deficiency anemia, unspecified: Secondary | ICD-10-CM | POA: Diagnosis not present

## 2021-04-20 DIAGNOSIS — I10 Essential (primary) hypertension: Secondary | ICD-10-CM

## 2021-04-20 MED ORDER — BENAZEPRIL HCL 20 MG PO TABS: 10.0000 mg | ORAL_TABLET | Freq: Every morning | ORAL | 0 refills | Status: DC

## 2021-04-20 MED ORDER — IRON (FERROUS SULFATE) 325 (65 FE) MG PO TABS: 325.0000 mg | ORAL_TABLET | Freq: Every day | ORAL | 1 refills | Status: DC

## 2021-05-22 ENCOUNTER — Other Ambulatory Visit: Payer: Self-pay | Admitting: Family Medicine

## 2021-05-22 DIAGNOSIS — E039 Hypothyroidism, unspecified: Secondary | ICD-10-CM

## 2021-05-22 NOTE — Telephone Encounter (Signed)
Requested Prescriptions  Pending Prescriptions Disp Refills  . levothyroxine (SYNTHROID) 75 MCG tablet [Pharmacy Med Name: LEVOTHYROXINE 75 MCG TABLET] 90 tablet 3    Sig: TAKE 1 TABLET (75 MCG TOTAL) BY MOUTH DAILY BEFORE BREAKFAST. TAKE 1 & 1/2 TABLET ON SUNDAYS     Endocrinology:  Hypothyroid Agents Failed - 05/22/2021 12:46 AM      Failed - TSH needs to be rechecked within 3 months after an abnormal result. Refill until TSH is due.      Passed - TSH in normal range and within 360 days    TSH  Date Value Ref Range Status  03/09/2021 3.45 0.40 - 4.50 mIU/L Final         Passed - Valid encounter within last 12 months    Recent Outpatient Visits          1 month ago Iron deficiency anemia, unspecified iron deficiency anemia type   [facility] Alba Cory, MD   2 months ago Uncontrolled hypertension   [facility] Alba Cory, MD   5 months ago Goiter   [facility] Alba Cory, MD   1 year ago Acquired hypothyroidism   [facility] Alba Cory, MD   1 year ago Uncontrolled hypertension   [facility] Alba Cory, MD      Future Appointments            In 2 months Carlynn Purl, Danna Hefty, MD [facility]   In 6 months  [facility], [facility]

## 2021-07-22 ENCOUNTER — Ambulatory Visit: Payer: Medicare Other | Admitting: Family Medicine

## 2021-08-25 ENCOUNTER — Ambulatory Visit (INDEPENDENT_AMBULATORY_CARE_PROVIDER_SITE_OTHER): Payer: Medicare Other | Admitting: Family Medicine

## 2021-08-25 ENCOUNTER — Other Ambulatory Visit: Payer: Self-pay

## 2021-08-25 ENCOUNTER — Other Ambulatory Visit: Payer: Self-pay | Admitting: Family Medicine

## 2021-08-25 ENCOUNTER — Encounter: Payer: Self-pay | Admitting: Family Medicine

## 2021-08-25 VITALS — BP 168/92 | HR 94 | Temp 97.5°F | Resp 18 | Ht 68.0 in | Wt 181.6 lb

## 2021-08-25 DIAGNOSIS — E049 Nontoxic goiter, unspecified: Secondary | ICD-10-CM

## 2021-08-25 DIAGNOSIS — I10 Essential (primary) hypertension: Secondary | ICD-10-CM | POA: Diagnosis not present

## 2021-08-25 DIAGNOSIS — E039 Hypothyroidism, unspecified: Secondary | ICD-10-CM | POA: Diagnosis not present

## 2021-08-25 DIAGNOSIS — M109 Gout, unspecified: Secondary | ICD-10-CM | POA: Diagnosis not present

## 2021-08-25 DIAGNOSIS — D708 Other neutropenia: Secondary | ICD-10-CM | POA: Diagnosis not present

## 2021-08-25 DIAGNOSIS — E611 Iron deficiency: Secondary | ICD-10-CM | POA: Diagnosis not present

## 2021-08-25 DIAGNOSIS — J3489 Other specified disorders of nose and nasal sinuses: Secondary | ICD-10-CM

## 2021-08-25 DIAGNOSIS — R972 Elevated prostate specific antigen [PSA]: Secondary | ICD-10-CM | POA: Diagnosis not present

## 2021-08-25 DIAGNOSIS — Z8673 Personal history of transient ischemic attack (TIA), and cerebral infarction without residual deficits: Secondary | ICD-10-CM

## 2021-08-25 DIAGNOSIS — R399 Unspecified symptoms and signs involving the genitourinary system: Secondary | ICD-10-CM

## 2021-08-25 DIAGNOSIS — R067 Sneezing: Secondary | ICD-10-CM | POA: Diagnosis not present

## 2021-08-25 MED ORDER — BENAZEPRIL HCL 10 MG PO TABS: 10.0000 mg | ORAL_TABLET | Freq: Three times a day (TID) | ORAL | 0 refills | Status: DC

## 2021-08-25 NOTE — Progress Notes (Signed)
inNameMabel Ford   MRN: [id number]    DOB: Jul 10, 1941   Date:08/25/2021       Progress Note  Subjective  Chief Complaint  Follow Up  HPI   Elevated PSA: he is 80 yo, discussed referral to Urologist or recheck in 6 months and he chose the later, so we will recheck it today   Iron deficiency: negative fecal immnoessay done May 22 and it was negative, he has been taking iron supplementation, denies abdominal pain or change in appetite.   HTN: he used to take norvasc but stopped years ago because developed angioedema, he was also given carvedilol and hydralazine but he stopped it on his own. He is currently taking Benazepril BID kist 10 mg and states is what works the best for him. BP is very high and has been elevated for a while, we added Bystolic but stopped because he states it caused a rash , he does not like taking medications but is willing to try taking ACE three times daily , he states taking it all at once makes him feel lethargic    Goiter:he has not been back to see endo, he has a goiter he is taking levothyroxine 75 mcg daily and half on sundays. . Denies dry skin or change in bowel movements, no palpitation    Gout: he has been off Allopurinol for months and no gout episodes.    Hyperglycemia:  denies polyphagia, polydipsia but has urinary frequency. Last A1C was at goal.   Leucopenia: discussed last labs, reviewed last labs and we will recheck it today    LUTS: going on for a while, initially noticed improvement with flomax, but stopped on his own because it stopped working after a while. He continues to have urinary frequency, urgency and nocturia, discussed referral to Urolgoist but he wants to hold off    Rhinorrhea: he states it started this am with rhinorrhea and also sneezing, no cough, sob, sore throat or change in appetite, no exposure to anyone he knows about covid, discussed quarantine  Patient Active Problem List   Diagnosis Date Noted   Iron deficiency  anemia 07/07/2019   History of TIAs 12/10/2018   HLD (hyperlipidemia) 11/02/2018   Intermittent low back pain 12/20/2016   Hyperglycemia 06/01/2016   Hypertension, benign 05/31/2016   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   Abnormal TSH 06/04/2015    Past Surgical History:  Procedure Laterality Date   COLONOSCOPY     CYST EXCISION Left 2008   Upper Back on Left Shoulder   ESOPHAGOGASTRODUODENOSCOPY (EGD) WITH PROPOFOL N/A 11/02/2018   Procedure: ESOPHAGOGASTRODUODENOSCOPY (EGD) WITH PROPOFOL;  Surgeon: Midge Minium, MD;  Location: [facility];  Service: Endoscopy;  Laterality: N/A;   HERNIA REPAIR     LIH    INGUINAL HERNIA REPAIR  11/27/2011   Procedure: HERNIA REPAIR INGUINAL ADULT;  Surgeon: Jetty Duhamel, MD;  Location: [facility];  Service: General;  Laterality: Right;  right inguinal hernia repair with mesh    Family History  Problem Relation Age of Onset   Breast cancer Mother     Social History   Tobacco Use   Smoking status: Former    Types: Cigars    Quit date: 11/22/1988    Years since quitting: 32.7   Smokeless tobacco: Never  Substance Use Topics   Alcohol use: No    Comment: he quit 10/2016     Current Outpatient Medications:    benazepril (LOTENSIN) 20 MG tablet, Take 0.5-1 tablets (  10-20 mg total) by mouth in the morning., Disp: 90 tablet, Rfl: 0   Iron, Ferrous Sulfate, 325 (65 Fe) MG TABS, Take 325 mg by mouth daily. Take with colace 100 mg daily, Disp: 100 tablet, Rfl: 1   levothyroxine (SYNTHROID) 75 MCG tablet, TAKE 1 TABLET (75 MCG TOTAL) BY MOUTH DAILY BEFORE BREAKFAST. TAKE 1 & 1/2 TABLET ON SUNDAYS, Disp: 90 tablet, Rfl: 3   triamcinolone cream (KENALOG) 0.1 %, Apply 1 application topically 2 (two) times daily., Disp: 30 g, Rfl: 0   colchicine 0.6 MG tablet, Take 1 tablet (0.6 mg total) by mouth daily as needed. May take two pills two hours apart if needed for gout attack (Patient not taking: No sig reported), Disp: 30 tablet, Rfl: 0    nebivolol (BYSTOLIC) 10 MG tablet, Take 1 tablet (10 mg total) by mouth daily. (Patient not taking: Reported on 08/25/2021), Disp: 90 tablet, Rfl: 0  Allergies  Allergen Reactions   Aspirin     GI bleed    Norvasc [Amlodipine] Other (See Comments)    angioedema   Statins     Per patient GI bleed and upset stomach     I personally reviewed active problem list, medication list, allergies, family history, social history, health maintenance with the patient/caregiver today.   ROS  Constitutional: Negative for fever or weight change.  Respiratory: Negative for cough and shortness of breath.   Cardiovascular: Negative for chest pain or palpitations.  Gastrointestinal: Negative for abdominal pain, no bowel changes.  Musculoskeletal: Negative for gait problem or joint swelling.  Skin: Negative for rash.  Neurological: Negative for dizziness or headache.  No other specific complaints in a complete review of systems (except as listed in HPI above).   Objective  Vitals:   08/25/21 1050 08/25/21 1100  BP: (!) 182/98 (!) 168/92  Pulse: 94   Resp: 18   Temp: (!) 97.5 F (36.4 C)   TempSrc: Oral   SpO2: 96%   Weight: 181 lb 9.6 oz (82.4 kg)   Height: 5\' 8"  (1.727 m)     Body mass index is 27.61 kg/m.  Physical Exam  Constitutional: Patient appears well-developed and well-nourished.  No distress.  HEENT: head atraumatic, normocephalic, pupils equal and reactive to light, neck supple, large goiter  Cardiovascular: Normal rate, regular rhythm and normal heart sounds.  No murmur heard. No BLE edema. Pulmonary/Chest: Effort normal and breath sounds normal. No respiratory distress. Abdominal: Soft.  There is no tenderness. Psychiatric: Patient has a normal mood and affect. behavior is normal. Judgment and thought content normal.   PHQ2/9: Depression screen [facility] 2/9 08/25/2021 04/20/2021 03/09/2021 12/10/2020 12/07/2020  Decreased Interest 0 0 0 0 0  Down, Depressed, Hopeless 0 0 0 0 0   PHQ - 2 Score 0 0 0 0 0  Altered sleeping 0 - 0 - -  Tired, decreased energy 0 - 0 - -  Change in appetite 0 - 0 - -  Feeling bad or failure about yourself  0 - 0 - -  Trouble concentrating 0 - 0 - -  Moving slowly or fidgety/restless 0 - 0 - -  Suicidal thoughts 0 - 0 - -  PHQ-9 Score 0 - 0 - -  Difficult doing work/chores Not difficult at all - Not difficult at all - -  Some recent data might be hidden    phq 9 is negative   Fall Risk: Fall Risk  08/25/2021 04/20/2021 03/09/2021 12/10/2020 12/07/2020  Falls in the past  year? 0 0 0 0 0  Number falls in past yr: 0 0 0 0 0  Injury with Fall? 0 0 0 0 0  Risk for fall due to : No Fall Risks - - - No Fall Risks  Follow up Falls prevention discussed - - - Falls prevention discussed      Functional Status Survey: Is the patient deaf or have difficulty hearing?: No Does the patient have difficulty seeing, even when wearing glasses/contacts?: No Does the patient have difficulty concentrating, remembering, or making decisions?: No Does the patient have difficulty walking or climbing stairs?: No Does the patient have difficulty dressing or bathing?: No Does the patient have difficulty doing errands alone such as visiting a doctor's office or shopping?: No    Assessment & Plan  1. Iron deficiency  - CBC with Differential/Platelet  2. History of TIA (transient ischemic attack)   3. Uncontrolled hypertension  - benazepril (LOTENSIN) 10 MG tablet; Take 1 tablet (10 mg total) by mouth in the morning, at noon, and at bedtime.  Dispense: 270 tablet; Refill: 0  4. Elevated PSA  - PSA  5. Other neutropenia (HCC)  - CBC with Differential/Platelet  6. Controlled gout   7. Lower urinary tract symptoms (LUTS)   8. Acquired hypothyroidism  Last TSH at goal   9. Goiter   10. Rhinorrhea  - Novel Coronavirus, NAA ([facility])  11. Sneezing  - Novel Coronavirus, NAA ([facility])

## 2021-08-26 LAB — CBC WITH DIFFERENTIAL/PLATELET
Absolute Monocytes: 515 cells/uL (ref 200–950)
Basophils Absolute: 59 cells/uL (ref 0–200)
Basophils Relative: 1.5 %
Eosinophils Absolute: 129 cells/uL (ref 15–500)
Eosinophils Relative: 3.3 %
HCT: 47.6 % (ref 38.5–50.0)
Hemoglobin: 15 g/dL (ref 13.2–17.1)
Lymphs Abs: 1299 cells/uL (ref 850–3900)
MCH: 25.7 pg — ABNORMAL LOW (ref 27.0–33.0)
MCHC: 31.5 g/dL — ABNORMAL LOW (ref 32.0–36.0)
MCV: 81.5 fL (ref 80.0–100.0)
MPV: 10.9 fL (ref 7.5–12.5)
Monocytes Relative: 13.2 %
Neutro Abs: 1899 cells/uL (ref 1500–7800)
Neutrophils Relative %: 48.7 %
Platelets: 307 10*3/uL (ref 140–400)
RBC: 5.84 10*6/uL — ABNORMAL HIGH (ref 4.20–5.80)
RDW: 14.9 % (ref 11.0–15.0)
Total Lymphocyte: 33.3 %
WBC: 3.9 10*3/uL (ref 3.8–10.8)

## 2021-08-26 LAB — SARS-COV-2, NAA 2 DAY TAT

## 2021-08-26 LAB — PSA: PSA: 4.55 ng/mL — ABNORMAL HIGH (ref <=4.00)

## 2021-08-26 LAB — NOVEL CORONAVIRUS, NAA: SARS-CoV-2, NAA: NOT DETECTED

## 2021-09-09 ENCOUNTER — Ambulatory Visit: Payer: Medicare Other

## 2021-11-21 ENCOUNTER — Other Ambulatory Visit: Payer: Self-pay | Admitting: Family Medicine

## 2021-11-21 DIAGNOSIS — I10 Essential (primary) hypertension: Secondary | ICD-10-CM

## 2021-11-25 ENCOUNTER — Ambulatory Visit: Payer: Medicare Other | Admitting: Family Medicine

## 2021-11-28 NOTE — Progress Notes (Signed)
Name: Dennis Ford   MRN: [id number]    DOB: 1941/08/18   Date:11/29/2021       Progress Note  Subjective  Chief Complaint  Follow Up  I connected with  Dennis Ford  on 11/29/21 at 11:40 AM EST by a video enabled telemedicine application and verified that I am speaking with the correct person using two identifiers.  I discussed the limitations of evaluation and management by telemedicine and the availability of in person appointments. The patient expressed understanding and agreed to proceed with the virtual visit  Staff also discussed with the patient that there may be a patient responsible charge related to this service. Patient Location: At home  Provider Location: [facility] Additional Individuals present: alone   HPI  URI: he states he developed cold symptoms started two days ago. He developed hoarseness  followed by a dry cough, occasionally she has a white sputum. He is feeling tired and has noticed lack of appetite. Discussed possible COVID-19, he will come by to get COVID-19 test done this afternoon . Discussed Paxlovid and possible side effects and emergency use indications. 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   Abnormal TSH 06/04/2015    Past Surgical History:  Procedure Laterality Date   COLONOSCOPY     CYST EXCISION Left 2008   Upper Back on Left Shoulder   ESOPHAGOGASTRODUODENOSCOPY (EGD) WITH PROPOFOL N/A 11/02/2018   Procedure: ESOPHAGOGASTRODUODENOSCOPY (EGD) WITH PROPOFOL;  Surgeon: Midge MiniumWohl, Darren, MD;  [facility];  Service: Endoscopy;  Laterality: N/A;   HERNIA REPAIR     [facility]    INGUINAL HERNIA REPAIR  11/27/2011   Procedure: HERNIA REPAIR INGUINAL ADULT;   Surgeon: Jetty DuhamelJames O Wyatt III, MD;  Location: [facility];  Service: General;  Laterality: Right;  right inguinal hernia repair with mesh    Family History  Problem Relation Age of Onset   Breast cancer Mother     Social History   Socioeconomic History   Marital status: Married    Spouse name: Not on file   Number of children: 2   Years of education: Not on file   Highest education level: 10th grade  Occupational History   Occupation: retired  Tobacco Use   Smoking status: Former    Types: Cigars    Quit date: 11/22/1988    Years since quitting: 33.0   Smokeless tobacco: Never  Vaping Use   Vaping Use: Never used  Substance and Sexual Activity   Alcohol use: No    Comment: he quit 10/2016   Drug use: No   Sexual activity: Yes    Partners: Female  Other Topics Concern   Not on file  Social History Narrative   Not on file   Social Determinants of Health   Financial Resource Strain: Low Risk    Difficulty of Paying Living Expenses: Not hard at all  Food Insecurity: No Food Insecurity   Worried About Programme researcher, broadcasting/film/videounning Out of Food in the Last Year: Never true   Ran Out of Food in the Last Year: Never true  Transportation Needs: No Transportation Needs   Lack of Transportation (Medical): No   Lack of Transportation (Non-Medical): No  Physical Activity: Inactive   Days of Exercise per Week: 0 days   Minutes of Exercise per Session: 0 min  Stress: No Stress Concern Present   Feeling of Stress : Not at all  Social Connections: Moderately Isolated   Frequency of Communication with Friends and Family: More than three times a week   Frequency of Social Gatherings with Friends and Family: More than three times a week   Attends Religious Services: Never   Database administrator or Organizations: No   Attends Engineer, structural: Never   Marital Status: Married  Catering manager Violence: Not At Risk   Fear of Current or Ex-Partner: No   Emotionally Abused: No    Physically Abused: No   Sexually Abused: No     Current Outpatient Medications:    benazepril (LOTENSIN) 20 MG tablet, TAKE 1/2 TABLET BY MOUTH IN THE MORNING AT NOON AND AT BEDTIME, Disp: 135 tablet, Rfl: 0   levothyroxine (SYNTHROID) 75 MCG tablet, TAKE 1 TABLET (75 MCG TOTAL) BY MOUTH DAILY BEFORE BREAKFAST. TAKE 1 & 1/2 TABLET ON SUNDAYS, Disp: 90 tablet, Rfl: 3   triamcinolone cream (KENALOG) 0.1 %, Apply 1 application topically 2 (two) times daily., Disp: 30 g, Rfl: 0   colchicine 0.6 MG tablet, Take 1 tablet (0.6 mg total) by mouth daily as needed. May take two pills two hours apart if needed for gout attack (Patient not taking: Reported on 04/20/2021), Disp: 30 tablet, Rfl: 0   Iron, Ferrous Sulfate, 325 (65 Fe) MG TABS, Take 325 mg by mouth daily. Take with colace 100 mg daily (Patient not taking: Reported on 11/29/2021), Disp: 100 tablet, Rfl: 1  Allergies  Allergen Reactions   Aspirin     GI bleed    Bystolic [Nebivolol Hcl]     Rash after taking it for months    Norvasc [Amlodipine] Other (See Comments)    angioedema   Statins     Per patient GI bleed and upset stomach     I personally reviewed active problem list, medication list, allergies, family history, social history, health maintenance with the patient/caregiver today.   ROS  Ten systems reviewed and is negative except as mentioned in HPI   Objective  Virtual encounter, vitals not obtained.  Body mass index is 27.93 kg/m.  Physical Exam  Awake, alert and oriented   PHQ2/9: Depression screen [facility] 2/9 11/29/2021 08/25/2021 04/20/2021 03/09/2021 12/10/2020  Decreased Interest 0 0 0 0 0  Down, Depressed, Hopeless 0 0 0 0 0  PHQ - 2 Score 0 0 0 0 0  Altered sleeping 0 0 - 0 -  Tired, decreased energy 1 0 - 0 -  Change in appetite 0 0 - 0 -  Feeling bad or failure about yourself  0 0 - 0 -  Trouble concentrating 0 0 - 0 -  Moving slowly or fidgety/restless 0 0 - 0 -  Suicidal thoughts 0 0 - 0 -  PHQ-9  Score 1 0 - 0 -  Difficult doing work/chores Not difficult at all Not difficult at all - Not difficult at all -  Some recent data might be hidden   PHQ-2/9 Result is negative.    Fall Risk: Fall Risk  11/29/2021 08/25/2021 04/20/2021 03/09/2021 12/10/2020  Falls in the past year? 0 0 0 0 0  Number falls in past yr: - 0 0 0 0  Injury with Fall? - 0 0 0 0  Risk for fall due to : -  No Fall Risks - - -  Follow up Falls prevention discussed Falls prevention discussed - - -     Assessment & Plan  1. Acute cough  - Novel Coronavirus, NAA ([facility])  2. Viral illness  - Novel Coronavirus, NAA ([facility])  3. Lack of appetite  - Novel Coronavirus, NAA ([facility])   I discussed the assessment and treatment plan with the patient. The patient was provided an opportunity to ask questions and all were answered. The patient agreed with the plan and demonstrated an understanding of the instructions.  The patient was advised to call back or seek an in-person evaluation if the symptoms worsen or if the condition fails to improve as anticipated.  I provided 15  minutes of non-face-to-face time during this encounter.

## 2021-11-29 ENCOUNTER — Telehealth (INDEPENDENT_AMBULATORY_CARE_PROVIDER_SITE_OTHER): Payer: Medicare Other | Admitting: Family Medicine

## 2021-11-29 ENCOUNTER — Encounter: Payer: Self-pay | Admitting: Family Medicine

## 2021-11-29 VITALS — Ht 67.5 in | Wt 181.0 lb

## 2021-11-29 DIAGNOSIS — R630 Anorexia: Secondary | ICD-10-CM | POA: Diagnosis not present

## 2021-11-29 DIAGNOSIS — B349 Viral infection, unspecified: Secondary | ICD-10-CM

## 2021-11-29 DIAGNOSIS — R051 Acute cough: Secondary | ICD-10-CM

## 2021-11-30 ENCOUNTER — Other Ambulatory Visit: Payer: Self-pay | Admitting: Family Medicine

## 2021-11-30 LAB — SARS-COV-2, NAA 2 DAY TAT

## 2021-11-30 LAB — NOVEL CORONAVIRUS, NAA: SARS-CoV-2, NAA: DETECTED — AB

## 2021-11-30 MED ORDER — NIRMATRELVIR/RITONAVIR (PAXLOVID)TABLET: 3.0000 | ORAL_TABLET | Freq: Two times a day (BID) | ORAL | 0 refills | Status: AC

## 2021-12-08 ENCOUNTER — Ambulatory Visit: Payer: Medicare Other

## 2021-12-13 ENCOUNTER — Ambulatory Visit (INDEPENDENT_AMBULATORY_CARE_PROVIDER_SITE_OTHER): Payer: Medicare Other

## 2021-12-13 DIAGNOSIS — Z0000 Encounter for general adult medical examination without abnormal findings: Secondary | ICD-10-CM | POA: Diagnosis not present

## 2021-12-13 NOTE — Patient Instructions (Signed)
[name] , Thank you for taking time to come for your Medicare Wellness Visit. I appreciate your ongoing commitment to your health goals. Please review the following plan we discussed and let me know if I can assist you in the future.   Screening recommendations/referrals: Colonoscopy: no longer required Recommended yearly ophthalmology/optometry visit for glaucoma screening and checkup Recommended yearly dental visit for hygiene and checkup  Vaccinations: Influenza vaccine: declined Pneumococcal vaccine: done 05/31/16 Tdap vaccine: done 10/16/13 Shingles vaccine: Shingrix discussed. Please contact your pharmacy for coverage information.  Covid-19:  done 06/16/20 & 07/07/20  Advanced directives: Advance directive discussed with you today. I have provided a copy for you to complete at home and have notarized. Once this is complete please bring a copy in to our office so we can scan it into your chart.   Conditions/risks identified: Keep up the great work!  Next appointment: Follow up in one year for your annual wellness visit.   Preventive Care 81 Years and Older, Male Preventive care refers to lifestyle choices and visits with your health care provider that can promote health and wellness. What does preventive care include? A yearly physical exam. This is also called an annual well check. Dental exams once or twice a year. Routine eye exams. Ask your health care provider how often you should have your eyes checked. Personal lifestyle choices, including: Daily care of your teeth and gums. Regular physical activity. Eating a healthy diet. Avoiding tobacco and drug use. Limiting alcohol use. Practicing safe sex. Taking low doses of aspirin every day. Taking vitamin and mineral supplements as recommended by your health care provider. What happens during an annual well check? The services and screenings done by your health care provider during your annual well check will depend on your  age, overall health, lifestyle risk factors, and family history of disease. Counseling  Your health care provider may ask you questions about your: Alcohol use. Tobacco use. Drug use. Emotional well-being. Home and relationship well-being. Sexual activity. Eating habits. History of falls. Memory and ability to understand (cognition). Work and work Astronomer. Screening  You may have the following tests or measurements: Height, weight, and BMI. Blood pressure. Lipid and cholesterol levels. These may be checked every 5 years, or more frequently if you are over 26 years old. Skin check. Lung cancer screening. You may have this screening every year starting at age 10 if you have a 30-pack-year history of smoking and currently smoke or have quit within the past 15 years. Fecal occult blood test (FOBT) of the stool. You may have this test every year starting at age 81. Flexible sigmoidoscopy or colonoscopy. You may have a sigmoidoscopy every 5 years or a colonoscopy every 10 years starting at age 81. Prostate cancer screening. Recommendations will vary depending on your family history and other risks. Hepatitis C blood test. Hepatitis B blood test. Sexually transmitted disease (STD) testing. Diabetes screening. This is done by checking your blood sugar (glucose) after you have not eaten for a while (fasting). You may have this done every 1-3 years. Abdominal aortic aneurysm (AAA) screening. You may need this if you are a current or former smoker. Osteoporosis. You may be screened starting at age 81 if you are at high risk. Talk with your health care provider about your test results, treatment options, and if necessary, the need for more tests. Vaccines  Your health care provider may recommend certain vaccines, such as: Influenza vaccine. This is recommended every year. Tetanus, diphtheria,  and acellular pertussis (Tdap, Td) vaccine. You may need a Td booster every 10 years. Zoster  vaccine. You may need this after age 65. Pneumococcal 13-valent conjugate (PCV13) vaccine. One dose is recommended after age 49. Pneumococcal polysaccharide (PPSV23) vaccine. One dose is recommended after age 52. Talk to your health care provider about which screenings and vaccines you need and how often you need them. This information is not intended to replace advice given to you by your health care provider. Make sure you discuss any questions you have with your health care provider. Document Released: 11/19/2015 Document Revised: 07/12/2016 Document Reviewed: 08/24/2015 Elsevier Interactive Patient Education  2017 [place] Prevention in the Home Falls can cause injuries. They can happen to people of all ages. There are many things you can do to make your home safe and to help prevent falls. What can I do on the outside of my home? Regularly fix the edges of walkways and driveways and fix any cracks. Remove anything that might make you trip as you walk through a door, such as a raised step or threshold. Trim any bushes or trees on the path to your home. Use bright outdoor lighting. Clear any walking paths of anything that might make someone trip, such as rocks or tools. Regularly check to see if handrails are loose or broken. Make sure that both sides of any steps have handrails. Any raised decks and porches should have guardrails on the edges. Have any leaves, snow, or ice cleared regularly. Use sand or salt on walking paths during winter. Clean up any spills in your garage right away. This includes oil or grease spills. What can I do in the bathroom? Use night lights. Install grab bars by the toilet and in the tub and shower. Do not use towel bars as grab bars. Use non-skid mats or decals in the tub or shower. If you need to sit down in the shower, use a plastic, non-slip stool. Keep the floor dry. Clean up any water that spills on the floor as soon as it happens. Remove  soap buildup in the tub or shower regularly. Attach bath mats securely with double-sided non-slip rug tape. Do not have throw rugs and other things on the floor that can make you trip. What can I do in the bedroom? Use night lights. Make sure that you have a light by your bed that is easy to reach. Do not use any sheets or blankets that are too big for your bed. They should not hang down onto the floor. Have a firm chair that has side arms. You can use this for support while you get dressed. Do not have throw rugs and other things on the floor that can make you trip. What can I do in the kitchen? Clean up any spills right away. Avoid walking on wet floors. Keep items that you use a lot in easy-to-reach places. If you need to reach something above you, use a strong step stool that has a grab bar. Keep electrical cords out of the way. Do not use floor polish or wax that makes floors slippery. If you must use wax, use non-skid floor wax. Do not have throw rugs and other things on the floor that can make you trip. What can I do with my stairs? Do not leave any items on the stairs. Make sure that there are handrails on both sides of the stairs and use them. Fix handrails that are broken or loose. Make sure  that handrails are as long as the stairways. Check any carpeting to make sure that it is firmly attached to the stairs. Fix any carpet that is loose or worn. Avoid having throw rugs at the top or bottom of the stairs. If you do have throw rugs, attach them to the floor with carpet tape. Make sure that you have a light switch at the top of the stairs and the bottom of the stairs. If you do not have them, ask someone to add them for you. What else can I do to help prevent falls? Wear shoes that: Do not have high heels. Have rubber bottoms. Are comfortable and fit you well. Are closed at the toe. Do not wear sandals. If you use a stepladder: Make sure that it is fully opened. Do not climb a  closed stepladder. Make sure that both sides of the stepladder are locked into place. Ask someone to hold it for you, if possible. Clearly mark and make sure that you can see: Any grab bars or handrails. First and last steps. Where the edge of each step is. Use tools that help you move around (mobility aids) if they are needed. These include: Canes. Walkers. Scooters. Crutches. Turn on the lights when you go into a dark area. Replace any light bulbs as soon as they burn out. Set up your furniture so you have a clear path. Avoid moving your furniture around. If any of your floors are uneven, fix them. If there are any pets around you, be aware of where they are. Review your medicines with your doctor. Some medicines can make you feel dizzy. This can increase your chance of falling. Ask your doctor what other things that you can do to help prevent falls. This information is not intended to replace advice given to you by your health care provider. Make sure you discuss any questions you have with your health care provider. Document Released: 08/19/2009 Document Revised: 03/30/2016 Document Reviewed: 11/27/2014 Elsevier Interactive Patient Education  2017 [employer].

## 2021-12-13 NOTE — Progress Notes (Signed)
Subjective:   Dennis Ford is a 81 y.o. male who presents for Medicare Annual/Subsequent preventive examination.  Virtual Visit via Telephone Note  I connected with  Dennis Ford on 12/13/21 at  2:50 PM EST by telephone and verified that I am speaking with the correct person using two identifiers.  Location: Patient: home Provider: [facility] Persons participating in the virtual visit: patient/Nurse Health Advisor   I discussed the limitations, risks, security and privacy concerns of performing an evaluation and management service by telephone and the availability of in person appointments. The patient expressed understanding and agreed to proceed.  Interactive audio and video telecommunications were attempted between this nurse and patient, however failed, due to patient having technical difficulties OR patient did not have access to video capability.  We continued and completed visit with audio only.  Some vital signs may be absent or patient reported.   Reather LittlerKasey Anaija Wissink, LPN   Review of Systems     Cardiac Risk Factors include: advanced age (>9755men, 31>65 women);male gender;hypertension     Objective:    There were no vitals filed for this visit. There is no height or weight on file to calculate BMI.  Advanced Directives 12/13/2021 12/07/2020 08/07/2019 11/02/2018 11/01/2018 10/29/2018 08/01/2017  Does Patient Have a Medical Advance Directive? No No No No No No No  Would patient like information on creating a medical advance directive? Yes (MAU/Ambulatory/Procedural Areas - Information given) No - Patient declined Yes (MAU/Ambulatory/Procedural Areas - Information given) No - Patient declined - No - Patient declined -    Current Medications (verified) Outpatient Encounter Medications as of 12/13/2021  Medication Sig   benazepril (LOTENSIN) 20 MG tablet TAKE 1/2 TABLET BY MOUTH IN THE MORNING AT NOON AND AT BEDTIME   colchicine 0.6 MG tablet Take 1 tablet (0.6 mg total) by mouth daily as  needed. May take two pills two hours apart if needed for gout attack   levothyroxine (SYNTHROID) 75 MCG tablet TAKE 1 TABLET (75 MCG TOTAL) BY MOUTH DAILY BEFORE BREAKFAST. TAKE 1 & 1/2 TABLET ON SUNDAYS   triamcinolone cream (KENALOG) 0.1 % Apply 1 application topically 2 (two) times daily.   [DISCONTINUED] Iron, Ferrous Sulfate, 325 (65 Fe) MG TABS Take 325 mg by mouth daily. Take with colace 100 mg daily (Patient not taking: Reported on 11/29/2021)   No facility-administered encounter medications on file as of 12/13/2021.    Allergies (verified) Aspirin, Bystolic [nebivolol hcl], Norvasc [amlodipine], and Statins   History: Past Medical History:  Diagnosis Date   Diabetes mellitus without complication (HCC)    Goiter    History of inguinal hernia repair    Hyperlipidemia    Hypertension    Past Surgical History:  Procedure Laterality Date   COLONOSCOPY     CYST EXCISION Left 2008   Upper Back on Left Shoulder   ESOPHAGOGASTRODUODENOSCOPY (EGD) WITH PROPOFOL N/A 11/02/2018   Procedure: ESOPHAGOGASTRODUODENOSCOPY (EGD) WITH PROPOFOL;  Surgeon: Midge MiniumWohl, Darren, MD;  Location: [facility] END[facility]COPY;  Service: Endoscopy;  Laterality: N/A;   HERNIA REPAIR     LIH    INGUINAL HERNIA REPAIR  11/27/2011   Procedure: HERNIA REPAIR INGUINAL ADULT;  Surgeon: Jetty DuhamelJames O Wyatt III, MD;  Location: [facility];  Service: General;  Laterality: Right;  right inguinal hernia repair with mesh   Family History  Problem Relation Age of Onset   Breast cancer Mother    Social History   Socioeconomic History   Marital status: Married    Spouse  name: Not on file   Number of children: 2   Years of education: Not on file   Highest education level: 10th grade  Occupational History   Occupation: retired  Tobacco Use   Smoking status: Former    Types: Cigars    Quit date: 11/22/1988    Years since quitting: 33.0   Smokeless tobacco: Never  Vaping Use   Vaping Use: Never used  Substance and  Sexual Activity   Alcohol use: No    Comment: he quit 10/2016   Drug use: No   Sexual activity: Yes    Partners: Female  Other Topics Concern   Not on file  Social History Narrative   Not on file   Social Determinants of Health   Financial Resource Strain: Low Risk    Difficulty of Paying Living Expenses: Not very hard  Food Insecurity: No Food Insecurity   Worried About Programme researcher, broadcasting/film/video in the Last Year: Never true   Ran Out of Food in the Last Year: Never true  Transportation Needs: No Transportation Needs   Lack of Transportation (Medical): No   Lack of Transportation (Non-Medical): No  Physical Activity: Inactive   Days of Exercise per Week: 0 days   Minutes of Exercise per Session: 0 min  Stress: No Stress Concern Present   Feeling of Stress : Not at all  Social Connections: Moderately Isolated   Frequency of Communication with Friends and Family: More than three times a week   Frequency of Social Gatherings with Friends and Family: More than three times a week   Attends Religious Services: Never   Database administrator or Organizations: No   Attends Banker Meetings: Never   Marital Status: Married    Tobacco Counseling Counseling given: Not Answered   Clinical Intake:                          Activities of Daily Living In your present state of health, do you have any difficulty performing the following activities: 12/13/2021 11/29/2021  Hearing? N N  Vision? N N  Difficulty concentrating or making decisions? N N  Walking or climbing stairs? N N  Dressing or bathing? N N  Doing errands, shopping? N N  Preparing Food and eating ? N -  Using the Toilet? N -  In the past six months, have you accidently leaked urine? N -  Do you have problems with loss of bowel control? N -  Managing your Medications? N -  Managing your Finances? N -  Housekeeping or managing your Housekeeping? N -  Some recent data might be hidden    Patient  Care Team: Alba Cory, MD as PCP - General (Family Medicine)  Indicate any recent Medical Services you may have received from other than [facility] providers in the past year (date may be approximate).     Assessment:   This is a routine wellness examination for Dennis Ford.  Hearing/Vision screen Hearing Screening - Comments:: Pt denies hearing difficulty Vision Screening - Comments:: Pt past due for eye exam. Established with [facility]  Dietary issues and exercise activities discussed: Current Exercise Habits: The patient does not participate in regular exercise at present, Exercise limited by: None identified   Goals Addressed             This Visit's Progress    Patient Stated   On track    Pt would like to maintain healthy  blood pressure.        Depression Screen PHQ 2/9 Scores 12/13/2021 11/29/2021 08/25/2021 04/20/2021 03/09/2021 12/10/2020 12/07/2020  PHQ - 2 Score 0 0 0 0 0 0 0  PHQ- 9 Score - 1 0 - 0 - -    Fall Risk Fall Risk  12/13/2021 11/29/2021 08/25/2021 04/20/2021 03/09/2021  Falls in the past year? 0 0 0 0 0  Number falls in past yr: 0 - 0 0 0  Injury with Fall? 0 - 0 0 0  Risk for fall due to : No Fall Risks - No Fall Risks - -  Follow up Falls prevention discussed Falls prevention discussed Falls prevention discussed - -    FALL RISK PREVENTION PERTAINING TO THE HOME:  Any stairs in or around the home? No  If so, are there any without handrails? No  Home free of loose throw rugs in walkways, pet beds, electrical cords, etc? Yes  Adequate lighting in your home to reduce risk of falls? Yes   ASSISTIVE DEVICES UTILIZED TO PREVENT FALLS:  Life alert? No  Use of a cane, walker or w/c? No  Grab bars in the bathroom? No  Shower chair or bench in shower? No  Elevated toilet seat or a handicapped toilet? No   TIMED UP AND GO:  Was the test performed? No .   Cognitive Function: Normal cognitive status assessed by direct observation by this Nurse Health Advisor. No  abnormalities found.       6CIT Screen 12/07/2020 08/07/2019  What Year? 0 points 0 points  What month? 0 points 0 points  What time? 0 points 0 points  Count back from 20 0 points 0 points  Months in reverse 2 points 0 points  Repeat phrase 6 points 6 points  Total Score 8 6    Immunizations Immunization History  Administered Date(s) Administered   PFIZER(Purple Top)SARS-COV-2 Vaccination 06/16/2020, 07/07/2020   Pneumococcal Conjugate-13 05/31/2016   Pneumococcal Polysaccharide-23 01/31/2013   Tdap 10/16/2013   Zoster, Live 07/09/2008    TDAP status: Up to date  Flu Vaccine status: Declined, Education has been provided regarding the importance of this vaccine but patient still declined. Advised may receive this vaccine at local pharmacy or Health Dept. Aware to provide a copy of the vaccination record if obtained from local pharmacy or Health Dept. Verbalized acceptance and understanding.  Pneumococcal vaccine status: Up to date  Covid-19 vaccine status: Completed vaccines  Qualifies for Shingles Vaccine? Yes   Zostavax completed Yes   Shingrix Completed?: No.    Education has been provided regarding the importance of this vaccine. Patient has been advised to call insurance company to determine out of pocket expense if they have not yet received this vaccine. Advised may also receive vaccine at local pharmacy or Health Dept. Verbalized acceptance and understanding.  Screening Tests Health Maintenance  Topic Date Due   COVID-19 Vaccine (3 - Pfizer risk series) 12/15/2021 (Originally 08/04/2020)   INFLUENZA VACCINE  02/03/2022 (Originally 06/06/2021)   Zoster Vaccines- Shingrix (1 of 2) 02/27/2022 (Originally 03/27/1960)   TETANUS/TDAP  08/15/2029 (Originally 10/17/2023)   Pneumonia Vaccine 18+ Years old  Completed   HPV VACCINES  Aged Out    Health Maintenance  There are no preventive care reminders to display for this patient.  Colorectal cancer screening: No longer  required.   Lung Cancer Screening: (Low Dose CT Chest recommended if Age 53-80 years, 30 pack-year currently smoking OR have quit w/in 15years.) does not qualify.  Additional Screening:  Hepatitis C Screening: does not qualify  Vision Screening: Recommended annual ophthalmology exams for early detection of glaucoma and other disorders of the eye. Is the patient up to date with their annual eye exam?  No  Who is the provider or what is the name of the office in which the patient attends annual eye exams? [facility].   Dental Screening: Recommended annual dental exams for proper oral hygiene  Community Resource Referral / Chronic Care Management: CRR required this visit?  No   CCM required this visit?  No      Plan:     I have personally reviewed and noted the following in the patients chart:   Medical and social history Use of alcohol, tobacco or illicit drugs  Current medications and supplements including opioid prescriptions. Patient is not currently taking opioid prescriptions. Functional ability and status Nutritional status Physical activity Advanced directives List of other physicians Hospitalizations, surgeries, and ER visits in previous 12 months Vitals Screenings to include cognitive, depression, and falls Referrals and appointments  In addition, I have reviewed and discussed with patient certain preventive protocols, quality metrics, and best practice recommendations. A written personalized care plan for preventive services as well as general preventive health recommendations were provided to patient.   Due to this being a telephonic visit, the after visit summary with patients personalized plan was offered to patient via mail or my-chart. Patient declined at this time.  Reather Littler, LPN   11/10/3792   Nurse Notes: none

## 2022-03-13 ENCOUNTER — Ambulatory Visit: Payer: Medicare Other | Admitting: Family Medicine

## 2022-05-05 NOTE — Progress Notes (Unsigned)
Name: Dennis Ford   MRN: [id number]    DOB: Aug 22, 1941   Date:05/08/2022       Progress Note  Subjective  Chief Complaint  Follow Up  HPI  Elevated PSA: he is 81 yo, last PSA still elevated but lower than the time before, he has LUTS and would like to resume flomax   Iron deficiency: negative fecal immnoessay done May 22 and it was negative, he has been taking iron supplementation, denies abdominal pain or change in appetite. We will recheck  CBC and iron studies today   HTN: he used to take norvasc but stopped years ago because developed angioedema, he was also given carvedilol and hydralazine but he stopped it on his own. He is currently taking Benazepril BID kist 10 mg and states is what works the best for him. BP is very high and has been elevated for a while, we added Bystolic but stopped because he states it caused a rash , he does not like taking medications. Last visit we recommended taking lotensin TID but he is only taking once a day still. Explained risk of heart attacks and strokes, we will send 10 mg dose to take one TID and return in one week for bp check with CMA and one month follow up with me   Goiter:he has not been back to see endo, he has a goiter he is taking levothyroxine 75 mcg daily and half on sundays. . Denies dry skin or change in bowel movements, no palpitation . Recheck TSH   Gout: he has been off Allopurinol for months and no gout episodes.    LUTS: going on for a while, initially noticed improvement with flomax, but stopped on his own because it stopped working after a while. He continues to have urinary frequency, urgency and nocturia, discussed referral to Urolgoist but he wants to hold off , he is wiling to try Flomax again    Chronic low back pain with radiculitis: he states he was working in his yard this morning and is having low back pain with radiculitis, he has good rom of spine, history of back surgery. Discussed Lyrica and he is willing to try it,  discussed importance of titrating dose up and down slowly   Patient Active Problem List   Diagnosis Date Noted   Iron deficiency anemia 07/07/2019   History of TIAs 12/10/2018   HLD (hyperlipidemia) 11/02/2018   Intermittent low back pain 12/20/2016   Hyperglycemia 06/01/2016   Hypertension, benign 05/31/2016   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   Abnormal TSH 06/04/2015    Past Surgical History:  Procedure Laterality Date   COLONOSCOPY     CYST EXCISION Left 2008   Upper Back on Left Shoulder   ESOPHAGOGASTRODUODENOSCOPY (EGD) WITH PROPOFOL N/A 11/02/2018   Procedure: ESOPHAGOGASTRODUODENOSCOPY (EGD) WITH PROPOFOL;  Surgeon: Midge Minium, MD;  Location: [facility];  Service: Endoscopy;  Laterality: N/A;   HERNIA REPAIR     LIH    INGUINAL HERNIA REPAIR  11/27/2011   Procedure: HERNIA REPAIR INGUINAL ADULT;  Surgeon: Jetty Duhamel, MD;  Location: [facility];  Service: General;  Laterality: Right;  right inguinal hernia repair with mesh    Family History  Problem Relation Age of Onset   Breast cancer Mother     Social History   Tobacco Use   Smoking status: Former    Types: Cigars    Quit date: 11/22/1988    Years since quitting: 33.4   Smokeless tobacco: Never  Substance Use Topics   Alcohol use: No    Comment: he quit 10/2016     Current Outpatient Medications:    benazepril (LOTENSIN) 20 MG tablet, TAKE 1/2 TABLET BY MOUTH IN THE MORNING AT NOON AND AT BEDTIME, Disp: 135 tablet, Rfl: 0   colchicine 0.6 MG tablet, Take 1 tablet (0.6 mg total) by mouth daily as needed. May take two pills two hours apart if needed for gout attack, Disp: 30 tablet, Rfl: 0   levothyroxine (SYNTHROID) 75 MCG tablet, TAKE 1 TABLET (75 MCG TOTAL) BY MOUTH DAILY BEFORE BREAKFAST. TAKE 1 & 1/2 TABLET ON SUNDAYS, Disp: 90 tablet, Rfl: 3   triamcinolone cream (KENALOG) 0.1 %, Apply 1 application topically 2 (two) times daily., Disp: 30 g, Rfl: 0  Allergies  Allergen Reactions    Aspirin     GI bleed    Bystolic [Nebivolol Hcl]     Rash after taking it for months    Norvasc [Amlodipine] Other (See Comments)    angioedema   Statins     Per patient GI bleed and upset stomach     I personally reviewed active problem list, medication list, allergies, family history, social history, health maintenance with the patient/caregiver today.   ROS  Ten systems reviewed and is negative except as mentioned in HPI   Objective  Vitals:   05/08/22 1429 05/08/22 1441  BP: (!) 192/90 (!) 192/88  Pulse: 87   Resp: 16   SpO2: 96%   Weight: 181 lb (82.1 kg)   Height: 5\' 8"  (1.727 m)     Body mass index is 27.52 kg/m.  Physical Exam  Constitutional: Patient appears well-developed and well-nourished. Overweight.  No distress.  HEENT: head atraumatic, normocephalic, pupils equal and reactive to light, neck supple Cardiovascular: Normal rate, regular rhythm and normal heart sounds.  No murmur heard. No BLE edema. Pulmonary/Chest: Effort normal and breath sounds normal. No respiratory distress. Abdominal: Soft.  There is no tenderness. Psychiatric: Patient has a normal mood and affect. behavior is normal. Judgment and thought content normal.  Muscular Skeletal: mild pain during palpation of lumbar spine, negative straight leg raise, able to do all rom   PHQ2/9:    05/08/2022    2:29 PM 12/13/2021    2:57 PM 11/29/2021   11:08 AM 08/25/2021   10:36 AM 04/20/2021    1:22 PM  Depression screen PHQ 2/9  Decreased Interest 1 0 0 0 0  Down, Depressed, Hopeless 0 0 0 0 0  PHQ - 2 Score 1 0 0 0 0  Altered sleeping 0  0 0   Tired, decreased energy 1  1 0   Change in appetite 0  0 0   Feeling bad or failure about yourself  0  0 0   Trouble concentrating 0  0 0   Moving slowly or fidgety/restless 0  0 0   Suicidal thoughts 0  0 0   PHQ-9 Score 2  1 0   Difficult doing work/chores   Not difficult at all Not difficult at all     phq 9 is positive   Fall Risk:     05/08/2022    2:29 PM 12/13/2021    3:00 PM 11/29/2021   11:08 AM 08/25/2021   10:36 AM 04/20/2021    1:22 PM  Fall Risk   Falls in the past year? 0 0 0 0 0  Number falls in past yr: 0 0  0 0  Injury with  Fall? 0 0  0 0  Risk for fall due to : No Fall Risks No Fall Risks  No Fall Risks   Follow up Falls prevention discussed Falls prevention discussed Falls prevention discussed Falls prevention discussed       Functional Status Survey: Is the patient deaf or have difficulty hearing?: No Does the patient have difficulty seeing, even when wearing glasses/contacts?: No Does the patient have difficulty concentrating, remembering, or making decisions?: No Does the patient have difficulty walking or climbing stairs?: No Does the patient have difficulty dressing or bathing?: No Does the patient have difficulty doing errands alone such as visiting a doctor's office or shopping?: No    Assessment & Plan  1. Uncontrolled hypertension  - Microalbumin / creatinine urine ratio - CBC with Differential/Platelet - COMPLETE METABOLIC PANEL WITH GFR - benazepril (LOTENSIN) 10 MG tablet; Take 1 tablet (10 mg total) by mouth in the morning, at noon, and at bedtime.  Dispense: 270 tablet; Refill: 0  2. Iron deficiency  - Iron, TIBC and Ferritin Panel  3. History of TIA (transient ischemic attack)  - Lipid panel  4. Elevated PSA  - PSA  5. Controlled gout   6. Goiter  Recheck TSH  7. Acquired hypothyroidism  - TSH  8. Chronic right-sided low back pain with right-sided sciatica  - pregabalin (LYRICA) 50 MG capsule; Take 1-3 capsules (50-150 mg total) by mouth 3 (three) times daily.  Dispense: 90 capsule; Refill: 0  9. Lower urinary tract symptoms (LUTS)  - tamsulosin (FLOMAX) 0.4 MG CAPS capsule; Take 1 capsule (0.4 mg total) by mouth daily.  Dispense: 90 capsule; Refill: 1

## 2022-05-08 ENCOUNTER — Encounter: Payer: Self-pay | Admitting: Family Medicine

## 2022-05-08 ENCOUNTER — Other Ambulatory Visit: Payer: Self-pay | Admitting: Family Medicine

## 2022-05-08 ENCOUNTER — Ambulatory Visit (INDEPENDENT_AMBULATORY_CARE_PROVIDER_SITE_OTHER): Payer: Medicare Other | Admitting: Family Medicine

## 2022-05-08 VITALS — BP 192/88 | HR 87 | Resp 16 | Ht 68.0 in | Wt 181.0 lb

## 2022-05-08 DIAGNOSIS — R972 Elevated prostate specific antigen [PSA]: Secondary | ICD-10-CM | POA: Diagnosis not present

## 2022-05-08 DIAGNOSIS — E049 Nontoxic goiter, unspecified: Secondary | ICD-10-CM

## 2022-05-08 DIAGNOSIS — E611 Iron deficiency: Secondary | ICD-10-CM | POA: Diagnosis not present

## 2022-05-08 DIAGNOSIS — I10 Essential (primary) hypertension: Secondary | ICD-10-CM

## 2022-05-08 DIAGNOSIS — M109 Gout, unspecified: Secondary | ICD-10-CM

## 2022-05-08 DIAGNOSIS — E039 Hypothyroidism, unspecified: Secondary | ICD-10-CM

## 2022-05-08 DIAGNOSIS — M5441 Lumbago with sciatica, right side: Secondary | ICD-10-CM

## 2022-05-08 DIAGNOSIS — G8929 Other chronic pain: Secondary | ICD-10-CM

## 2022-05-08 DIAGNOSIS — Z8673 Personal history of transient ischemic attack (TIA), and cerebral infarction without residual deficits: Secondary | ICD-10-CM | POA: Diagnosis not present

## 2022-05-08 DIAGNOSIS — R399 Unspecified symptoms and signs involving the genitourinary system: Secondary | ICD-10-CM

## 2022-05-08 MED ORDER — PREGABALIN 50 MG PO CAPS: 50.0000 mg | ORAL_CAPSULE | Freq: Three times a day (TID) | ORAL | 0 refills | Status: DC

## 2022-05-08 MED ORDER — BENAZEPRIL HCL 10 MG PO TABS: 10.0000 mg | ORAL_TABLET | Freq: Three times a day (TID) | ORAL | 0 refills | Status: DC

## 2022-05-08 MED ORDER — TAMSULOSIN HCL 0.4 MG PO CAPS: 0.4000 mg | ORAL_CAPSULE | Freq: Every day | ORAL | 1 refills | Status: DC

## 2022-05-09 LAB — CBC WITH DIFFERENTIAL/PLATELET
Absolute Monocytes: 435 cells/uL (ref 200–950)
Basophils Absolute: 51 cells/uL (ref 0–200)
Basophils Relative: 1.6 %
Eosinophils Absolute: 70 cells/uL (ref 15–500)
Eosinophils Relative: 2.2 %
HCT: 46.9 % (ref 38.5–50.0)
Hemoglobin: 14.3 g/dL (ref 13.2–17.1)
Lymphs Abs: 1235 cells/uL (ref 850–3900)
MCH: 24.8 pg — ABNORMAL LOW (ref 27.0–33.0)
MCHC: 30.5 g/dL — ABNORMAL LOW (ref 32.0–36.0)
MCV: 81.4 fL (ref 80.0–100.0)
MPV: 10.6 fL (ref 7.5–12.5)
Monocytes Relative: 13.6 %
Neutro Abs: 1408 cells/uL — ABNORMAL LOW (ref 1500–7800)
Neutrophils Relative %: 44 %
Platelets: 332 10*3/uL (ref 140–400)
RBC: 5.76 10*6/uL (ref 4.20–5.80)
RDW: 14.5 % (ref 11.0–15.0)
Total Lymphocyte: 38.6 %
WBC: 3.2 10*3/uL — ABNORMAL LOW (ref 3.8–10.8)

## 2022-05-09 LAB — COMPLETE METABOLIC PANEL WITH GFR
AG Ratio: 1.2 (calc) (ref 1.0–2.5)
ALT: 9 U/L (ref 9–46)
AST: 15 U/L (ref 10–35)
Albumin: 3.9 g/dL (ref 3.6–5.1)
Alkaline phosphatase (APISO): 51 U/L (ref 35–144)
BUN/Creatinine Ratio: 12 (calc) (ref 6–22)
BUN: 15 mg/dL (ref 7–25)
CO2: 25 mmol/L (ref 20–32)
Calcium: 9.2 mg/dL (ref 8.6–10.3)
Chloride: 106 mmol/L (ref 98–110)
Creat: 1.26 mg/dL — ABNORMAL HIGH (ref 0.70–1.22)
Globulin: 3.3 g/dL (calc) (ref 1.9–3.7)
Glucose, Bld: 93 mg/dL (ref 65–99)
Potassium: 4.4 mmol/L (ref 3.5–5.3)
Sodium: 140 mmol/L (ref 135–146)
Total Bilirubin: 0.3 mg/dL (ref 0.2–1.2)
Total Protein: 7.2 g/dL (ref 6.1–8.1)
eGFR: 57 mL/min/{1.73_m2} — ABNORMAL LOW (ref >=60)

## 2022-05-09 LAB — LIPID PANEL
Cholesterol: 158 mg/dL (ref <200)
HDL: 49 mg/dL (ref >=40)
LDL Cholesterol (Calc): 82 mg/dL (calc)
Non-HDL Cholesterol (Calc): 109 mg/dL (calc) (ref <130)
Total CHOL/HDL Ratio: 3.2 (calc) (ref <5.0)
Triglycerides: 171 mg/dL — ABNORMAL HIGH (ref <150)

## 2022-05-09 LAB — IRON,TIBC AND FERRITIN PANEL
%SAT: 9 % (calc) — ABNORMAL LOW (ref 20–48)
Ferritin: 18 ng/mL — ABNORMAL LOW (ref 24–380)
Iron: 28 ug/dL — ABNORMAL LOW (ref 50–180)
TIBC: 311 mcg/dL (calc) (ref 250–425)

## 2022-05-09 LAB — TSH: TSH: 2.54 mIU/L (ref 0.40–4.50)

## 2022-05-09 LAB — PSA: PSA: 4.39 ng/mL — ABNORMAL HIGH (ref <=4.00)

## 2022-05-12 ENCOUNTER — Telehealth: Payer: Self-pay

## 2022-05-12 NOTE — Telephone Encounter (Signed)
When I called to give his lab results he stated he will not be able to come in for his nurse visit on Monday, 05/15/22. He request that we please cancel it. Thank you!

## 2022-05-15 ENCOUNTER — Ambulatory Visit: Payer: Medicare Other

## 2022-05-22 ENCOUNTER — Other Ambulatory Visit: Payer: Self-pay | Admitting: Family Medicine

## 2022-05-22 DIAGNOSIS — E039 Hypothyroidism, unspecified: Secondary | ICD-10-CM

## 2022-05-25 ENCOUNTER — Other Ambulatory Visit: Payer: Self-pay | Admitting: Family Medicine

## 2022-05-25 DIAGNOSIS — I10 Essential (primary) hypertension: Secondary | ICD-10-CM

## 2022-06-26 NOTE — Progress Notes (Unsigned)
Name: Dennis Ford   MRN: [id number]    DOB: 08/25/1941   Date:06/27/2022       Progress Note  Subjective  Chief Complaint  Follow Up  HPI   HTN: he used to take norvasc but stopped years ago because developed angioedema, he was also given carvedilol and hydralazine but he stopped it on his own. Bystolic caused a rash He is currently taking Benazepril BID kist 10 mg and states is what works the best for him however his bp continues to be elevated in the 200 's SBP, he denies headaches, dizziness or chest pain. We tried lotensin TID but he states could not tolerate it, explained getting bp under better control will likely cause some dizziness initially. Discussed referral to cardiologist and nephrologist but he is not interested. 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   Abnormal TSH 06/04/2015    Past Surgical History:  Procedure Laterality Date   COLONOSCOPY     CYST EXCISION Left 2008   Upper Back on Left Shoulder   ESOPHAGOGASTRODUODENOSCOPY (EGD) WITH PROPOFOL N/A 11/02/2018   Procedure: ESOPHAGOGASTRODUODENOSCOPY (EGD) WITH PROPOFOL;  Surgeon: Lucilla Lame, MD;  Location: [facility] ENDOSCOPY;  Service: Endoscopy;  Laterality: N/A;   HERNIA REPAIR     [facility]    INGUINAL HERNIA REPAIR  11/27/2011   Procedure: HERNIA REPAIR INGUINAL ADULT;  Surgeon: Belva Crome, MD;  Location: [place];  Service: General;  Laterality: Right;  right  inguinal hernia repair with mesh    Family History  Problem Relation Age of Onset   Breast cancer Mother     Social History   Tobacco Use   Smoking status: Former    Types: Cigars    Quit date: 11/22/1988    Years since quitting: 33.6   Smokeless tobacco: Never  Substance Use Topics   Alcohol use: No    Comment: he quit 10/2016     Current Outpatient Medications:    benazepril (LOTENSIN) 20 MG tablet, TAKE 1/2 TABLET BY MOUTH IN THE MORNING AT NOON AND AT BEDTIME, Disp: 135 tablet, Rfl: 0   colchicine 0.6 MG tablet, Take 1 tablet (0.6 mg total) by mouth daily as needed. May take two pills two hours apart if needed for gout attack, Disp: 30 tablet, Rfl: 0   levothyroxine (SYNTHROID) 75 MCG tablet, TAKE 1 TABLET (75 MCG TOTAL) BY MOUTH DAILY BEFORE BREAKFAST. TAKE 1 & 1/2 TABLET ON SUNDAYS, Disp: 90 tablet, Rfl: 3   pregabalin (LYRICA) 50 MG capsule, Take 1-3 capsules (50-150 mg total) by mouth 3 (three) times daily., Disp: 90 capsule, Rfl: 0   tamsulosin (FLOMAX) 0.4 MG CAPS capsule, Take 1 capsule (0.4 mg total) by mouth daily., Disp: 90 capsule, Rfl: 1   triamcinolone cream (KENALOG) 0.1 %, Apply 1 application topically 2 (two) times daily., Disp: 30 g, Rfl: 0  Allergies  Allergen Reactions   Aspirin  GI bleed    Bystolic [Nebivolol Hcl]     Rash after taking it for months    Norvasc [Amlodipine] Other (See Comments)    angioedema   Statins     Per patient GI bleed and upset stomach     I personally reviewed active problem list, medication list, allergies, family history, social history, health maintenance with the patient/caregiver today.   ROS  Ten systems reviewed and is negative except as mentioned in HPI   Objective  Vitals:   06/27/22 1514 06/27/22 1521  BP: (!) 194/102 (!) 186/94  Pulse: 81   Resp: 16   SpO2: 98%   Weight: 181 lb (82.1 kg)   Height: _0  (1.702 m)     Body mass index is 28.35 kg/m.  Physical Exam  Constitutional: Patient  appears well-developed and well-nourished.  No distress.  HEENT: head atraumatic, normocephalic, pupils equal and reactive to light, neck supple Cardiovascular: Normal rate, regular rhythm and normal heart sounds.  No murmur heard. No BLE edema. Pulmonary/Chest: Effort normal and breath sounds normal. No respiratory distress. Abdominal: Soft.  There is no tenderness. Psychiatric: Patient has a normal mood and affect. behavior is normal. Judgment and thought content normal.   Recent Results (from the past 2160 hour(s))  Lipid panel     Status: Abnormal   Collection Time: 05/08/22  3:19 PM  Result Value Ref Range   Cholesterol 158 <200 mg/dL   HDL 49 > OR = 40 mg/dL   Triglycerides 171 (H) <150 mg/dL   LDL Cholesterol (Calc) 82 mg/dL (calc)    Comment: Reference range: <100 . Desirable range <100 mg/dL for primary prevention;   <70 mg/dL for patients with CHD or diabetic patients  with > or = 2 CHD risk factors. Marland Kitchen LDL-C is now calculated using the Martin-Hopkins  calculation, which is a validated novel method providing  better accuracy than the Friedewald equation in the  estimation of LDL-C.  Cresenciano Genre et al. Annamaria Helling. 7425;956(38): 2061-2068  (http://education.[facility].com/faq/FAQ164)    Total CHOL/HDL Ratio 3.2 <5.0 (calc)   Non-HDL Cholesterol (Calc) 109 <130 mg/dL (calc)    Comment: For patients with diabetes plus 1 major ASCVD risk  factor, treating to a non-HDL-C goal of <100 mg/dL  (LDL-C of <70 mg/dL) is considered a therapeutic  option.   CBC with Differential/Platelet     Status: Abnormal   Collection Time: 05/08/22  3:19 PM  Result Value Ref Range   WBC 3.2 (L) 3.8 - 10.8 Thousand/uL   RBC 5.76 4.20 - 5.80 Million/uL   Hemoglobin 14.3 13.2 - 17.1 g/dL   HCT 46.9 38.5 - 50.0 %   MCV 81.4 80.0 - 100.0 fL   MCH 24.8 (L) 27.0 - 33.0 pg   MCHC 30.5 (L) 32.0 - 36.0 g/dL   RDW 14.5 11.0 - 15.0 %   Platelets 332 140 - 400 Thousand/uL   MPV 10.6 7.5 - 12.5 fL    Neutro Abs 1,408 (L) 1,500 - 7,800 cells/uL   Lymphs Abs 1,235 850 - 3,900 cells/uL   Absolute Monocytes 435 200 - 950 cells/uL   Eosinophils Absolute 70 15 - 500 cells/uL   Basophils Absolute 51 0 - 200 cells/uL   Neutrophils Relative % 44 %   Total Lymphocyte 38.6 %   Monocytes Relative 13.6 %   Eosinophils Relative 2.2 %   Basophils Relative 1.6 %  COMPLETE METABOLIC PANEL WITH GFR     Status: Abnormal   Collection Time: 05/08/22  3:19 PM  Result Value Ref Range   Glucose, Bld 93 65 - 99 mg/dL    Comment: .            Fasting reference interval .    BUN 15 7 - 25 mg/dL   Creat 1.26 (H) 0.70 - 1.22 mg/dL   eGFR 57 (L) > OR = 60 mL/min/1.89m    Comment: The eGFR is based on the CKD-EPI 2021 equation. To calculate  the new eGFR from a previous Creatinine or Cystatin C result, go to https://www.kidney.org/professionals/ kdoqi/gfr%5Fcalculator    BUN/Creatinine Ratio 12 6 - 22 (calc)   Sodium 140 135 - 146 mmol/L   Potassium 4.4 3.5 - 5.3 mmol/L   Chloride 106 98 - 110 mmol/L   CO2 25 20 - 32 mmol/L   Calcium 9.2 8.6 - 10.3 mg/dL   Total Protein 7.2 6.1 - 8.1 g/dL   Albumin 3.9 3.6 - 5.1 g/dL   Globulin 3.3 1.9 - 3.7 g/dL (calc)   AG Ratio 1.2 1.0 - 2.5 (calc)   Total Bilirubin 0.3 0.2 - 1.2 mg/dL   Alkaline phosphatase (APISO) 51 35 - 144 U/L   AST 15 10 - 35 U/L   ALT 9 9 - 46 U/L  Iron, TIBC and Ferritin Panel     Status: Abnormal   Collection Time: 05/08/22  3:19 PM  Result Value Ref Range   Iron 28 (L) 50 - 180 mcg/dL   TIBC 311 250 - 425 mcg/dL (calc)   %SAT 9 (L) 20 - 48 % (calc)   Ferritin 18 (L) 24 - 380 ng/mL  TSH     Status: None   Collection Time: 05/08/22  3:19 PM  Result Value Ref Range   TSH 2.54 0.40 - 4.50 mIU/L  PSA     Status: Abnormal   Collection Time: 05/08/22  3:19 PM  Result Value Ref Range   PSA 4.39 (H) < OR = 4.00 ng/mL    Comment: The total PSA value from this assay system is  standardized against the WHO standard. The test  result  will be approximately 20% lower when compared  to the equimolar-standardized total PSA (Beckman  Coulter). Comparison of serial PSA results should be  interpreted with this fact in mind. . This test was performed using the Siemens  chemiluminescent method. Values obtained from  different assay methods cannot be used interchangeably. PSA levels, regardless of value, should not be interpreted as absolute evidence of the presence or absence of disease.     PHQ2/9:    06/27/2022    3:15 PM 05/08/2022    2:29 PM 12/13/2021    2:57 PM 11/29/2021   11:08 AM 08/25/2021   10:36 AM  Depression screen PHQ 2/9  Decreased Interest 0 1 0 0 0  Down, Depressed, Hopeless 0 0 0 0 0  PHQ - 2 Score 0 1 0 0 0  Altered sleeping 0 0  0 0  Tired, decreased energy 0 1  1 0  Change in appetite 0 0  0 0  Feeling bad or failure about yourself  0 0  0 0  Trouble concentrating 0 0  0 0  Moving slowly or fidgety/restless 0 0  0 0  Suicidal thoughts 0 0  0 0  PHQ-9 Score 0 2  1 0  Difficult doing work/chores    Not difficult at all Not difficult at all    phq 9 is negative   Fall Risk:  06/27/2022    3:15 PM 05/08/2022    2:29 PM 12/13/2021    3:00 PM 11/29/2021   11:08 AM 08/25/2021   10:36 AM  Fall Risk   Falls in the past year? 0 0 0 0 0  Number falls in past yr: 0 0 0  0  Injury with Fall? 0 0 0  0  Risk for fall due to : No Fall Risks No Fall Risks No Fall Risks  No Fall Risks  Follow up _0       Functional Status Survey: Is the patient deaf or have difficulty hearing?: No Does the patient have difficulty seeing, even when wearing glasses/contacts?: No Does the patient have difficulty concentrating, remembering, or making decisions?: No Does the patient have difficulty walking or climbing stairs?: No Does the patient have difficulty dressing or bathing?: No Does  the patient have difficulty doing errands alone such as visiting a doctor's office or shopping?: No    Assessment & Plan  1. Uncontrolled hypertension  - cloNIDine (CATAPRES) 0.1 MG tablet; Take 1 tablet (0.1 mg total) by mouth every evening.  Dispense: 30 tablet; Refill: 0

## 2022-06-27 ENCOUNTER — Ambulatory Visit (INDEPENDENT_AMBULATORY_CARE_PROVIDER_SITE_OTHER): Payer: Medicare Other | Admitting: Family Medicine

## 2022-06-27 ENCOUNTER — Encounter: Payer: Self-pay | Admitting: Family Medicine

## 2022-06-27 VITALS — BP 184/100 | HR 81 | Resp 16 | Ht 67.0 in | Wt 181.0 lb

## 2022-06-27 DIAGNOSIS — I10 Essential (primary) hypertension: Secondary | ICD-10-CM

## 2022-06-27 MED ORDER — CLONIDINE HCL 0.1 MG PO TABS: 0.1000 mg | ORAL_TABLET | Freq: Every evening | ORAL | 0 refills | Status: DC

## 2022-06-30 ENCOUNTER — Ambulatory Visit: Payer: Self-pay | Admitting: *Deleted

## 2022-06-30 NOTE — Telephone Encounter (Signed)
Summary: Med Management.   Pt stated Dr.Sowles sent in medication cloNIDine (CATAPRES) 0.1 MG tablet and he was previously taking medication benazepril (LOTENSIN) 20 MG tablet.  Pt would like to know if he should stop the benazepril (LOTENSIN) 20 MG tablet or continue taking both.    Pt seeking clinical advice      Called patient to review questions regarding medications clonidine and lotensin. Patient reports on bottle of lotensin sig. Take 1/2 tablet by mouth in the morning at noon at bedtime. Written on 05/25/22. Reviewed with patient from last OV note PCP writes to take lotensin either 20 mg when he wakes up or half pill when he wakes up and half a pill at lunch. And to take clonidine 0.1 mg at night. Patient reports  he would like to take lotensin 20 mg when he wakes up and clonidine 0.1 mg at night and see how he feels. Recommended patient keep a log if sx noted taking full dose of lotensin when he wakes up and report to PCP if sx occur. Please advise if patient should be taking medication differently.         Reason for Disposition  [1] Caller has URGENT medicine question about med that PCP or specialist prescribed AND [2] triager unable to answer question  Answer Assessment - Initial Assessment Questions 1. NAME of MEDICINE: "What medicine(s) are you calling about?"     Benazepril (lotensin) 20 mg and clonidine (catapress) 0.1 mg  2. QUESTION: "What is your question?" (e.g., double dose of medicine, side effect)      On bottle of medication from pharmacy directions for lotensin sig.  Take 1/2 tablet by mouth in the morning at noon at bedtime. Written 05/25/22 3. PRESCRIBER: "Who prescribed the medicine?" Reason: if prescribed by specialist, call should be referred to that group.     PCP 4. SYMPTOMS: "Do you have any symptoms?" If Yes, ask: "What symptoms are you having?"  "How bad are the symptoms (e.g., mild, moderate, severe)     Na  5. PREGNANCY:  "Is there any chance that you  are pregnant?" "When was your last menstrual period?"     na  Protocols used: Medication Question Call-A-AH

## 2022-06-30 NOTE — Telephone Encounter (Signed)
Called patient and let him know. Answered all questions. Patient gave verbal understanding.

## 2022-07-21 ENCOUNTER — Other Ambulatory Visit: Payer: Self-pay | Admitting: Family Medicine

## 2022-07-21 DIAGNOSIS — I10 Essential (primary) hypertension: Secondary | ICD-10-CM

## 2022-07-25 NOTE — Progress Notes (Unsigned)
Name: Dennis Ford   MRN: [id number]    DOB: Jan 31, 1941   Date:07/26/2022       Progress Note  Subjective  Chief Complaint  Follow up   HPI  HTN: he used to take norvasc but stopped years ago because developed angioedema, he was also given carvedilol and hydralazine but he stopped it on his own. Bystolic caused a rash He is currently taking Benazepril BID kist 10 mg and states is what works the best for him however his bp at home the other morning was 158/90's  he denies headaches, dizziness or chest pain. We tried lotensin TID but he states could not tolerate it but he will try to take it again three times a day  He is now taking Clonidine at night and has improved his nocturia - resting better at night. Taking half of lotesin at breakfast and half at lunch bp is still high . Discussed referral to nephrologist . 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   Abnormal TSH 06/04/2015    Past Surgical History:  Procedure Laterality Date   COLONOSCOPY     CYST EXCISION Left 2008   Upper Back on Left Shoulder   ESOPHAGOGASTRODUODENOSCOPY (EGD) WITH PROPOFOL N/A 11/02/2018   Procedure: ESOPHAGOGASTRODUODENOSCOPY (EGD) WITH PROPOFOL;  Surgeon: Lucilla Lame, MD;  Location: [facility] ENDOSCOPY;  Service: Endoscopy;  Laterality: N/A;   HERNIA REPAIR     LIH    INGUINAL HERNIA REPAIR  11/27/2011   Procedure: HERNIA REPAIR INGUINAL ADULT;  Surgeon: Belva Crome, MD;  Location: [place];  Service: General;  Laterality: Right;  right inguinal hernia repair with mesh    Family  History  Problem Relation Age of Onset   Breast cancer Mother     Social History   Tobacco Use   Smoking status: Former    Types: Cigars    Quit date: 11/22/1988    Years since quitting: 33.6   Smokeless tobacco: Never  Substance Use Topics   Alcohol use: No    Comment: he quit 10/2016     Current Outpatient Medications:    benazepril (LOTENSIN) 20 MG tablet, TAKE 1/2 TABLET BY MOUTH IN THE MORNING AT NOON AND AT BEDTIME, Disp: 135 tablet, Rfl: 0   cloNIDine (CATAPRES) 0.1 MG tablet, TAKE 1 TABLET BY MOUTH EVERY EVENING., Disp: 30 tablet, Rfl: 0   colchicine 0.6 MG tablet, Take 1 tablet (0.6 mg total) by mouth daily as needed. May take two pills two hours apart if needed for gout attack, Disp: 30 tablet, Rfl: 0   levothyroxine (SYNTHROID) 75 MCG tablet, TAKE 1 TABLET (75 MCG TOTAL) BY MOUTH DAILY BEFORE BREAKFAST. TAKE 1 & 1/2 TABLET ON SUNDAYS, Disp: 90 tablet, Rfl: 3   tamsulosin (FLOMAX) 0.4 MG CAPS capsule, Take 1 capsule (0.4 mg total) by mouth daily., Disp: 90 capsule, Rfl: 1   triamcinolone cream (KENALOG) 0.1 %, Apply 1 application topically 2 (two) times daily., Disp: 30 g, Rfl: 0  Allergies  Allergen Reactions   Aspirin     GI bleed    [employer]  Hcl]     Rash after taking it for months    Norvasc [Amlodipine] Other (See Comments)    angioedema   Statins     Per patient GI bleed and upset stomach     I personally reviewed active problem list, medication list, allergies, family history, social history, health maintenance with the patient/caregiver today.   ROS  Ten systems reviewed and is negative except as mentioned in HPI   Objective  Vitals:   07/26/22 1447 07/26/22 1455  BP: (!) 202/100 (!) 196/96  Pulse: 90   Resp: 16   SpO2: 97%   Weight: 177 lb (80.3 kg)   Height: '5\' 7"'  (1.702 m)     Body mass index is 27.72 kg/m.  Physical Exam  Constitutional: Patient appears well-developed and well-nourished.  No distress.  HEENT: head  atraumatic, normocephalic, pupils equal and reactive to light, neck supple Cardiovascular: Normal rate, regular rhythm and normal heart sounds.  No murmur heard. No BLE edema. Pulmonary/Chest: Effort normal and breath sounds normal. No respiratory distress. Abdominal: Soft.  There is no tenderness. Psychiatric: Patient has a normal mood and affect. behavior is normal. Judgment and thought content normal.   Recent Results (from the past 2160 hour(s))  Lipid panel     Status: Abnormal   Collection Time: 05/08/22  3:19 PM  Result Value Ref Range   Cholesterol 158 <200 mg/dL   HDL 49 > OR = 40 mg/dL   Triglycerides 171 (H) <150 mg/dL   LDL Cholesterol (Calc) 82 mg/dL (calc)    Comment: Reference range: <100 . Desirable range <100 mg/dL for primary prevention;   <70 mg/dL for patients with CHD or diabetic patients  with > or = 2 CHD risk factors. Marland Kitchen LDL-C is now calculated using the Martin-Hopkins  calculation, which is a validated novel method providing  better accuracy than the Friedewald equation in the  estimation of LDL-C.  Cresenciano Genre et al. Annamaria Helling. 2751;700(17): 2061-2068  (http://education.[facility].com/faq/FAQ164)    Total CHOL/HDL Ratio 3.2 <5.0 (calc)   Non-HDL Cholesterol (Calc) 109 <130 mg/dL (calc)    Comment: For patients with diabetes plus 1 major ASCVD risk  factor, treating to a non-HDL-C goal of <100 mg/dL  (LDL-C of <70 mg/dL) is considered a therapeutic  option.   CBC with Differential/Platelet     Status: Abnormal   Collection Time: 05/08/22  3:19 PM  Result Value Ref Range   WBC 3.2 (L) 3.8 - 10.8 Thousand/uL   RBC 5.76 4.20 - 5.80 Million/uL   Hemoglobin 14.3 13.2 - 17.1 g/dL   HCT 46.9 38.5 - 50.0 %   MCV 81.4 80.0 - 100.0 fL   MCH 24.8 (L) 27.0 - 33.0 pg   MCHC 30.5 (L) 32.0 - 36.0 g/dL   RDW 14.5 11.0 - 15.0 %   Platelets 332 140 - 400 Thousand/uL   MPV 10.6 7.5 - 12.5 fL   Neutro Abs 1,408 (L) 1,500 - 7,800 cells/uL   Lymphs Abs 1,235 850 -  3,900 cells/uL   Absolute Monocytes 435 200 - 950 cells/uL   Eosinophils Absolute 70 15 - 500 cells/uL   Basophils Absolute 51 0 - 200 cells/uL   Neutrophils Relative % 44 %   Total Lymphocyte 38.6 %   Monocytes Relative 13.6 %   Eosinophils Relative 2.2 %   Basophils Relative 1.6 %  COMPLETE METABOLIC PANEL WITH GFR     Status: Abnormal   Collection Time: 05/08/22  3:19 PM  Result Value Ref  Range   Glucose, Bld 93 65 - 99 mg/dL    Comment: .            Fasting reference interval .    BUN 15 7 - 25 mg/dL   Creat 1.26 (H) 0.70 - 1.22 mg/dL   eGFR 57 (L) > OR = 60 mL/min/1.68m    Comment: The eGFR is based on the CKD-EPI 2021 equation. To calculate  the new eGFR from a previous Creatinine or Cystatin C result, go to https://www.kidney.org/professionals/ kdoqi/gfr%5Fcalculator    BUN/Creatinine Ratio 12 6 - 22 (calc)   Sodium 140 135 - 146 mmol/L   Potassium 4.4 3.5 - 5.3 mmol/L   Chloride 106 98 - 110 mmol/L   CO2 25 20 - 32 mmol/L   Calcium 9.2 8.6 - 10.3 mg/dL   Total Protein 7.2 6.1 - 8.1 g/dL   Albumin 3.9 3.6 - 5.1 g/dL   Globulin 3.3 1.9 - 3.7 g/dL (calc)   AG Ratio 1.2 1.0 - 2.5 (calc)   Total Bilirubin 0.3 0.2 - 1.2 mg/dL   Alkaline phosphatase (APISO) 51 35 - 144 U/L   AST 15 10 - 35 U/L   ALT 9 9 - 46 U/L  Iron, TIBC and Ferritin Panel     Status: Abnormal   Collection Time: 05/08/22  3:19 PM  Result Value Ref Range   Iron 28 (L) 50 - 180 mcg/dL   TIBC 311 250 - 425 mcg/dL (calc)   %SAT 9 (L) 20 - 48 % (calc)   Ferritin 18 (L) 24 - 380 ng/mL  TSH     Status: None   Collection Time: 05/08/22  3:19 PM  Result Value Ref Range   TSH 2.54 0.40 - 4.50 mIU/L  PSA     Status: Abnormal   Collection Time: 05/08/22  3:19 PM  Result Value Ref Range   PSA 4.39 (H) < OR = 4.00 ng/mL    Comment: The total PSA value from this assay system is  standardized against the WHO standard. The test  result will be approximately 20% lower when compared  to the  equimolar-standardized total PSA (Beckman  Coulter). Comparison of serial PSA results should be  interpreted with this fact in mind. . This test was performed using the Siemens  chemiluminescent method. Values obtained from  different assay methods cannot be used interchangeably. PSA levels, regardless of value, should not be interpreted as absolute evidence of the presence or absence of disease.     PHQ2/9:    07/26/2022    2:47 PM 06/27/2022    3:15 PM 05/08/2022    2:29 PM 12/13/2021    2:57 PM 11/29/2021   11:08 AM  Depression screen PHQ 2/9  Decreased Interest 0 0 1 0 0  Down, Depressed, Hopeless 0 0 0 0 0  PHQ - 2 Score 0 0 1 0 0  Altered sleeping 0 0 0  0  Tired, decreased energy 0 0 1  1  Change in appetite 0 0 0  0  Feeling bad or failure about yourself  0 0 0  0  Trouble concentrating 0 0 0  0  Moving slowly or fidgety/restless 0 0 0  0  Suicidal thoughts 0 0 0  0  PHQ-9 Score 0 0 2  1  Difficult doing work/chores     Not difficult at all    phq 9 is negative   Fall Risk:    07/26/2022    2:46 PM  06/27/2022    3:15 PM 05/08/2022    2:29 PM 12/13/2021    3:00 PM 11/29/2021   11:08 AM  Fall Risk   Falls in the past year? 0 0 0 0 0  Number falls in past yr: 0 0 0 0   Injury with Fall? 0 0 0 0   Risk for fall due to : No Fall Risks No Fall Risks No Fall Risks No Fall Risks   Follow up Falls prevention discussed Falls prevention discussed Falls prevention discussed Falls prevention discussed Falls prevention discussed      Functional Status Survey: Is the patient deaf or have difficulty hearing?: No Does the patient have difficulty seeing, even when wearing glasses/contacts?: No Does the patient have difficulty concentrating, remembering, or making decisions?: No Does the patient have difficulty walking or climbing stairs?: No Does the patient have difficulty dressing or bathing?: No Does the patient have difficulty doing errands alone such as visiting a doctor's  office or shopping?: No    Assessment & Plan   1. Uncontrolled hypertension  - cloNIDine (CATAPRES) 0.1 MG tablet; Take 1 tablet (0.1 mg total) by mouth 3 (three) times daily.  Dispense: 90 tablet; Refill: 0   He does not want to see nephrologist at this time , we will check for secondary causes   - cloNIDine (CATAPRES) 0.1 MG tablet; Take 1 tablet (0.1 mg total) by mouth 3 (three) times daily.  Dispense: 90 tablet; Refill: 0 - US Renal Artery Stenosis; Future - Aldosterone + renin activity w/ ratio - Urine Microalbumin w/creat. ratio - Catecholamines, fractionated, plasma

## 2022-07-26 ENCOUNTER — Ambulatory Visit (INDEPENDENT_AMBULATORY_CARE_PROVIDER_SITE_OTHER): Payer: Medicare Other | Admitting: Family Medicine

## 2022-07-26 ENCOUNTER — Encounter: Payer: Self-pay | Admitting: Family Medicine

## 2022-07-26 VITALS — BP 196/96 | HR 90 | Resp 16 | Ht 67.0 in | Wt 177.0 lb

## 2022-07-26 DIAGNOSIS — I10 Essential (primary) hypertension: Secondary | ICD-10-CM | POA: Diagnosis not present

## 2022-07-26 MED ORDER — CLONIDINE HCL 0.1 MG PO TABS
0.1000 mg | ORAL_TABLET | Freq: Three times a day (TID) | ORAL | 0 refills | Status: DC
Start: 2022-07-26 — End: 2022-08-27

## 2022-08-17 ENCOUNTER — Other Ambulatory Visit: Payer: Self-pay | Admitting: Family Medicine

## 2022-08-17 DIAGNOSIS — I10 Essential (primary) hypertension: Secondary | ICD-10-CM

## 2022-08-17 NOTE — Telephone Encounter (Signed)
Vm full tried to contact pt to see if he has enough medication to last until his appt next week.

## 2022-08-22 NOTE — Progress Notes (Deleted)
Name: Dennis Ford   MRN: [id number]    DOB: [birth year]   Date:08/22/2022       Progress Note  Subjective  Chief Complaint  Follow Up  HPI  HTN: he used to take norvasc but stopped years ago because developed angioedema, he was also given carvedilol and hydralazine but he stopped it on his own. Bystolic caused a rash He is currently taking Benazepril BID kist 10 mg and states is what works the best for him however his bp at home the other morning was 158/90's  he denies headaches, dizziness or chest pain. We tried lotensin TID but he states could not tolerate it but he will try to take it again three times a day  He is now taking Clonidine at night and has improved his nocturia - resting better at night. Taking half of lotesin at breakfast and half at lunch bp is still high . Discussed referral to nephrologist . 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   Abnormal TSH 06/04/2015    Past Surgical History:  Procedure Laterality Date   COLONOSCOPY     CYST EXCISION Left 2008   Upper Back on Left Shoulder   ESOPHAGOGASTRODUODENOSCOPY (EGD) WITH PROPOFOL N/A 11/02/2018   Procedure: ESOPHAGOGASTRODUODENOSCOPY (EGD) WITH PROPOFOL;  Surgeon: Lucilla Lame, MD;  Location: [facility];  Service: Endoscopy;  Laterality: N/A;   HERNIA REPAIR     [facility]    INGUINAL HERNIA REPAIR  11/27/2011   Procedure: HERNIA REPAIR INGUINAL ADULT;  Surgeon: Belva Crome, MD;  Location: [place];  Service: General;  Laterality: Right;  right inguinal hernia repair with mesh    Family  History  Problem Relation Age of Onset   Breast cancer Mother     Social History   Tobacco Use   Smoking status: Former    Types: Cigars    Quit date: 11/22/1988    Years since quitting: 33.7   Smokeless tobacco: Never  Substance Use Topics   Alcohol use: No    Comment: he quit 10/2016     Current Outpatient Medications:    benazepril (LOTENSIN) 20 MG tablet, TAKE 1/2 TABLET BY MOUTH IN THE MORNING AT NOON AND AT BEDTIME, Disp: 135 tablet, Rfl: 0   cloNIDine (CATAPRES) 0.1 MG tablet, Take 1 tablet (0.1 mg total) by mouth 3 (three) times daily., Disp: 90 tablet, Rfl: 0   colchicine 0.6 MG tablet, Take 1 tablet (0.6 mg total) by mouth daily as needed. May take two pills two hours apart if needed for gout attack, Disp: 30 tablet, Rfl: 0   levothyroxine (SYNTHROID) 75 MCG tablet, TAKE 1 TABLET (75 MCG TOTAL) BY MOUTH DAILY BEFORE BREAKFAST. TAKE 1 & 1/2 TABLET ON SUNDAYS, Disp: 90 tablet, Rfl: 3   tamsulosin (FLOMAX) 0.4 MG CAPS capsule, Take 1 capsule (0.4 mg total) by mouth daily., Disp: 90 capsule, Rfl: 1   triamcinolone cream (KENALOG) 0.1 %, Apply 1 application topically 2 (two) times daily., Disp: 30 g, Rfl: 0  Allergies  Allergen Reactions   Aspirin     GI bleed  Bystolic [Nebivolol Hcl]     Rash after taking it for months    Norvasc [Amlodipine] Other (See Comments)    angioedema   Statins     Per patient GI bleed and upset stomach     I personally reviewed active problem list, medication list, allergies, family history, social history, health maintenance with the patient/caregiver today.   ROS  ***  Objective  There were no vitals filed for this visit.  There is no height or weight on file to calculate BMI.  Physical Exam ***  No results found for this or any previous visit (from the past 2160 hour(s)).   PHQ2/9:    07/26/2022    2:47 PM 06/27/2022    3:15 PM 05/08/2022    2:29 PM 12/13/2021    2:57 PM 11/29/2021   11:08 AM  Depression screen PHQ 2/9   Decreased Interest 0 0 1 0 0  Down, Depressed, Hopeless 0 0 0 0 0  PHQ - 2 Score 0 0 1 0 0  Altered sleeping 0 0 0  0  Tired, decreased energy 0 0 1  1  Change in appetite 0 0 0  0  Feeling bad or failure about yourself  0 0 0  0  Trouble concentrating 0 0 0  0  Moving slowly or fidgety/restless 0 0 0  0  Suicidal thoughts 0 0 0  0  PHQ-9 Score 0 0 2  1  Difficult doing work/chores     Not difficult at all    phq 9 is {gen pos [id number]}   Fall Risk:    07/26/2022    2:46 PM 06/27/2022    3:15 PM 05/08/2022    2:29 PM 12/13/2021    3:00 PM 11/29/2021   11:08 AM  Fall Risk   Falls in the past year? 0 0 0 0 0  Number falls in past yr: 0 0 0 0   Injury with Fall? 0 0 0 0   Risk for fall due to : No Fall Risks No Fall Risks No Fall Risks No Fall Risks   Follow up Falls prevention discussed Falls prevention discussed Falls prevention discussed Falls prevention discussed Falls prevention discussed      Functional Status Survey:      Assessment & Plan  *** There are no diagnoses linked to this encounter.

## 2022-08-23 ENCOUNTER — Ambulatory Visit: Payer: Medicare Other | Admitting: Family Medicine

## 2022-08-25 ENCOUNTER — Other Ambulatory Visit: Payer: Self-pay | Admitting: Family Medicine

## 2022-08-25 DIAGNOSIS — I10 Essential (primary) hypertension: Secondary | ICD-10-CM

## 2022-08-29 ENCOUNTER — Ambulatory Visit: Payer: Self-pay | Admitting: *Deleted

## 2022-08-29 NOTE — Telephone Encounter (Signed)
Summary: rx clarity   The patient would like confirmation from a member of clinical staff on directions for their cloNIDine (CATAPRES) 0.1 MG tablet [[id number]]   The patient shares that the directions for their medication have changed recently   The patient has taken 1 tablet today around 10 AM   The patient would like to know if taking three tablets is correct   Please contact the patient further when possible       Called patient to review medication requests. In review of chart, orders of clonidine( catapres) 0.1 mg ordered to take 1 tablet by mouth 3 times daily on 08/27/22. Patient reports he has only been taking in the evenings. Reports clarification for benzapril (lotensin) 20 mg ordered to take 1/2 tablet by mouth in the morning at noon and at bedtime. Patient reports he has been taking 1/2 tablet in the morning and night. Patient unable to check BP for NT now, due to working outside and unable to check at this time. Patient feels if he takes too much medication he will not tolerate well. Patient would like to know if taking clonidine 0.1 mg in am and hs and taking benzapril 20 mg 1/2 tablets in am and hs will be ok until seen for future visit on 10/02/22 with PCP.  Recommended to patient to keep a log of dates, times, and BP until being seen by PCP . Please advise. Requesting a call back today please .   Reason for Disposition  [1] Caller has URGENT medicine question about med that PCP or specialist prescribed AND [2] triager unable to answer question  Answer Assessment - Initial Assessment Questions 1. NAME of MEDICINE: "What medicine(s) are you calling about?"     Benzapril and catapress 2. QUESTION: "What is your question?" (e.g., double dose of medicine, side effect)     How much and how often to take? 3. PRESCRIBER: "Who prescribed the medicine?" Reason: if prescribed by specialist, call should be referred to that group.     PCP 4. SYMPTOMS: "Do you have any symptoms?" If Yes,  ask: "What symptoms are you having?"  "How bad are the symptoms (e.g., mild, moderate, severe)     None now was feeling tired. Concerned recommended amount will be "too much" and lower BP too much.  5. PREGNANCY:  "Is there any chance that you are pregnant?" "When was your last menstrual period?"     na  Protocols used: Medication Question Call-A-AH

## 2022-09-27 NOTE — Progress Notes (Signed)
Name: Dennis Ford   MRN: [id number]    DOB: 05-05-1941   Date:10/02/2022       Progress Note  Subjective  Chief Complaint  Follow Up  HPI  HTN: he used to take norvasc but stopped years ago because developed angioedema, he was also given carvedilol and hydralazine but he stopped it on his own. Bystolic caused a rash He is currently taking Benazepril BID kist 10 mg and states is what works the best for him however his bp at home the other morning was 158/90's  he denies headaches, dizziness or chest pain. We tried lotensin TID but he states could not tolerate and is back on just half twice daily , since last visit in Sep , he is taking clonidine 0.1 mg , he started only at night and is now taking twice daily and bp at home has been around 160. He states he is nervous when he gets to our office. Advised to try taking it three times daily and if still above 140 we can go up to 0.2 mg dose  He has tried norvasc, bystolic, carvedilol, hydralazine, Hygroton, labetalol and maxzide We ordered labs for secondary causes of HTN but it was not done yet    Iron deficiency: negative fecal immnoessay done May 22 and it was negative, he has been taking iron supplementation, denies abdominal pain or change in appetite. We will check FIT test    Goiter:he has not been back to see endo, he has a goiter he is taking levothyroxine 75 mcg daily and half on sundays. . Denies dry skin or change in bowel movements, no palpitation . Last TSH at goal    Gout: he has been off Allopurinol for months and no gout episodes.    LUTS: going on for a while, initially noticed improvement with flomax, but stopped on his own because it stopped working after a while. He continues to have urinary frequency, urgency and nocturia, discussed referral to Urolgoist but he wants to hold off. He also has a history of high PSA. He is back on Flomax and he has noticed improvement of symptoms    Chronic low back pain with radiculitis: he  states symptoms of radiculitis resolved, he did not take lyrica, worried about side effects   Metabolic syndrome: we will monitor   Patient Active Problem List   Diagnosis Date Noted   Iron deficiency anemia 07/07/2019   History of TIAs 12/10/2018   HLD (hyperlipidemia) 11/02/2018   Intermittent low back pain 12/20/2016   Hyperglycemia 06/01/2016   Hypertension, benign 05/31/2016   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   Abnormal TSH 06/04/2015    Past Surgical History:  Procedure Laterality Date   COLONOSCOPY     CYST EXCISION Left 2008   Upper Back on Left Shoulder   ESOPHAGOGASTRODUODENOSCOPY (EGD) WITH PROPOFOL N/A 11/02/2018   Procedure: ESOPHAGOGASTRODUODENOSCOPY (EGD) WITH PROPOFOL;  Surgeon: Midge Minium, MD;  Location: [facility];  Service: Endoscopy;  Laterality: N/A;   HERNIA REPAIR     [facility]    INGUINAL HERNIA REPAIR  11/27/2011   Procedure: HERNIA REPAIR INGUINAL ADULT;  Surgeon: Jetty Duhamel, MD;  [facility];  Service: General;  Laterality: Right;  right inguinal hernia repair with mesh    Family History  Problem Relation Age of Onset   Breast cancer Mother     Social History   Tobacco Use   Smoking status: Former    Types: Cigars    Quit date:  11/22/1988    Years since quitting: 33.8   Smokeless tobacco: Never  Substance Use Topics   Alcohol use: No    Comment: he quit 10/2016     Current Outpatient Medications:    benazepril (LOTENSIN) 20 MG tablet, TAKE 1/2 TABLET BY MOUTH IN THE MORNING AT NOON AND AT BEDTIME, Disp: 135 tablet, Rfl: 0   cloNIDine (CATAPRES) 0.1 MG tablet, TAKE 1 TABLET BY MOUTH 3 TIMES DAILY., Disp: 270 tablet, Rfl: 0   colchicine 0.6 MG tablet, Take 1 tablet (0.6 mg total) by mouth daily as needed. May take two pills two hours apart if needed for gout attack, Disp: 30 tablet, Rfl: 0   levothyroxine (SYNTHROID) 75 MCG tablet, TAKE 1 TABLET (75 MCG TOTAL) BY MOUTH DAILY BEFORE BREAKFAST. TAKE 1 & 1/2 TABLET ON  SUNDAYS, Disp: 90 tablet, Rfl: 3   tamsulosin (FLOMAX) 0.4 MG CAPS capsule, Take 1 capsule (0.4 mg total) by mouth daily., Disp: 90 capsule, Rfl: 1   triamcinolone cream (KENALOG) 0.1 %, Apply 1 application topically 2 (two) times daily., Disp: 30 g, Rfl: 0  Allergies  Allergen Reactions   Aspirin     GI bleed    Bystolic [Nebivolol Hcl]     Rash after taking it for months    Norvasc [Amlodipine] Other (See Comments)    angioedema   Statins     Per patient GI bleed and upset stomach     I personally reviewed active problem list, medication list, allergies, family history, social history, health maintenance with the patient/caregiver today.   ROS  Constitutional: Negative for fever or weight change.  Respiratory: Negative for cough and shortness of breath.   Cardiovascular: Negative for chest pain or palpitations.  Gastrointestinal: Negative for abdominal pain, no bowel changes.  Musculoskeletal: Negative for gait problem or joint swelling.  Skin: Negative for rash.  Neurological: Negative for dizziness or headache.  No other specific complaints in a complete review of systems (except as listed in HPI above).   Objective  Vitals:   10/02/22 1152 10/02/22 1202  BP: (!) 190/96 (!) 182/88  Pulse: 74   Resp: 16   Temp: 97.9 F (36.6 C)   TempSrc: Oral   SpO2: 98%   Weight: 179 lb 14.4 oz (81.6 kg)   Height: 5' 7.5" (1.715 m)     Body mass index is 27.76 kg/m.  Physical Exam  Constitutional: Patient appears well-developed and well-nourished.  No distress.  HEENT: head atraumatic, normocephalic, pupils equal and reactive to light, neck supple Cardiovascular: Normal rate, regular rhythm and normal heart sounds.  No murmur heard. No BLE edema. Pulmonary/Chest: Effort normal and breath sounds normal. No respiratory distress. Abdominal: Soft.  There is no tenderness. Psychiatric: Patient has a normal mood and affect. behavior is normal. Judgment and thought content normal.     PHQ2/9:    10/02/2022   11:51 AM 07/26/2022    2:47 PM 06/27/2022    3:15 PM 05/08/2022    2:29 PM 12/13/2021    2:57 PM  Depression screen PHQ 2/9  Decreased Interest 0 0 0 1 0  Down, Depressed, Hopeless 0 0 0 0 0  PHQ - 2 Score 0 0 0 1 0  Altered sleeping 0 0 0 0   Tired, decreased energy 0 0 0 1   Change in appetite 0 0 0 0   Feeling bad or failure about yourself  0 0 0 0   Trouble concentrating 0 0 0 0  Moving slowly or fidgety/restless 0 0 0 0   Suicidal thoughts 0 0 0 0   PHQ-9 Score 0 0 0 2     phq 9 is negative   Fall Risk:    10/02/2022   11:51 AM 07/26/2022    2:46 PM 06/27/2022    3:15 PM 05/08/2022    2:29 PM 12/13/2021    3:00 PM  Fall Risk   Falls in the past year? 0 0 0 0 0  Number falls in past yr:  0 0 0 0  Injury with Fall?  0 0 0 0  Risk for fall due to : No Fall Risks No Fall Risks No Fall Risks No Fall Risks No Fall Risks  Follow up Falls prevention discussed;Education provided;Falls evaluation completed Falls prevention discussed Falls prevention discussed Falls prevention discussed Falls prevention discussed      Functional Status Survey: Is the patient deaf or have difficulty hearing?: No Does the patient have difficulty seeing, even when wearing glasses/contacts?: No Does the patient have difficulty concentrating, remembering, or making decisions?: No Does the patient have difficulty walking or climbing stairs?: No Does the patient have difficulty dressing or bathing?: No Does the patient have difficulty doing errands alone such as visiting a doctor's office or shopping?: No    Assessment & Plan  1. Other iron deficiency anemia  - Fecal Globin By Immunochemistry  2. Uncontrolled hypertension  We will adjust dose of clonidine to TID, currently only taking twice a day   3. Iron deficiency  We will get a FIT test  4. Controlled gout  Doing well   5. Lower urinary tract symptoms (LUTS)  Continue flomax  6. History of TIA  (transient ischemic attack)  Needs better control of bp   7. Metabolic syndrome   8. Acquired hypothyroidism   Last TSH at goal

## 2022-10-02 ENCOUNTER — Ambulatory Visit (INDEPENDENT_AMBULATORY_CARE_PROVIDER_SITE_OTHER): Payer: Medicare Other | Admitting: Family Medicine

## 2022-10-02 ENCOUNTER — Encounter: Payer: Self-pay | Admitting: Family Medicine

## 2022-10-02 VITALS — BP 182/88 | HR 74 | Temp 97.9°F | Resp 16 | Ht 67.5 in | Wt 179.9 lb

## 2022-10-02 DIAGNOSIS — M109 Gout, unspecified: Secondary | ICD-10-CM

## 2022-10-02 DIAGNOSIS — D508 Other iron deficiency anemias: Secondary | ICD-10-CM | POA: Diagnosis not present

## 2022-10-02 DIAGNOSIS — E8881 Metabolic syndrome: Secondary | ICD-10-CM

## 2022-10-02 DIAGNOSIS — E039 Hypothyroidism, unspecified: Secondary | ICD-10-CM

## 2022-10-02 DIAGNOSIS — E611 Iron deficiency: Secondary | ICD-10-CM | POA: Diagnosis not present

## 2022-10-02 DIAGNOSIS — I10 Essential (primary) hypertension: Secondary | ICD-10-CM

## 2022-10-02 DIAGNOSIS — R399 Unspecified symptoms and signs involving the genitourinary system: Secondary | ICD-10-CM

## 2022-10-02 DIAGNOSIS — Z8673 Personal history of transient ischemic attack (TIA), and cerebral infarction without residual deficits: Secondary | ICD-10-CM

## 2022-10-03 LAB — MICROALBUMIN / CREATININE URINE RATIO
Creatinine, Urine: 150 mg/dL (ref 20–320)
Microalb Creat Ratio: 5 mcg/mg creat (ref <30)
Microalb, Ur: 0.8 mg/dL

## 2022-10-13 ENCOUNTER — Other Ambulatory Visit: Payer: Self-pay | Admitting: Family Medicine

## 2022-10-13 DIAGNOSIS — I10 Essential (primary) hypertension: Secondary | ICD-10-CM

## 2022-10-16 ENCOUNTER — Ambulatory Visit: Payer: Medicare Other

## 2022-10-16 VITALS — BP 164/102

## 2022-10-16 DIAGNOSIS — Z0130 Encounter for examination of blood pressure without abnormal findings: Secondary | ICD-10-CM

## 2022-10-16 NOTE — Progress Notes (Signed)
Patient stated he has not taken his blood pressure medication yet. He state he planned to take when he goes home.

## 2022-10-17 LAB — FECAL GLOBIN BY IMMUNOCHEMISTRY
FECAL GLOBIN RESULT:: NOT DETECTED
MICRO NUMBER:: 14296983
SPECIMEN QUALITY:: ADEQUATE

## 2022-10-19 ENCOUNTER — Other Ambulatory Visit: Payer: Self-pay | Admitting: Family Medicine

## 2022-10-19 DIAGNOSIS — I10 Essential (primary) hypertension: Secondary | ICD-10-CM

## 2022-11-05 ENCOUNTER — Other Ambulatory Visit: Payer: Self-pay | Admitting: Family Medicine

## 2022-11-05 DIAGNOSIS — R399 Unspecified symptoms and signs involving the genitourinary system: Secondary | ICD-10-CM

## 2022-11-07 NOTE — Telephone Encounter (Signed)
Requested Prescriptions  Pending Prescriptions Disp Refills   tamsulosin (FLOMAX) 0.4 MG CAPS capsule [Pharmacy Med Name: TAMSULOSIN HCL 0.4 MG CAPSULE] 90 capsule 0    Sig: TAKE 1 CAPSULE BY MOUTH EVERY DAY     Urology: Alpha-Adrenergic Blocker Failed - 11/05/2022  1:35 AM      Failed - PSA in normal range and within 360 days    PSA  Date Value Ref Range Status  05/08/2022 4.39 (H) < OR = 4.00 ng/mL Final    Comment:    The total PSA value from this assay system is  standardized against the WHO standard. The test  result will be approximately 20% lower when compared  to the equimolar-standardized total PSA (Beckman  Coulter). Comparison of serial PSA results should be  interpreted with this fact in mind. . This test was performed using the Siemens  chemiluminescent method. Values obtained from  different assay methods cannot be used interchangeably. PSA levels, regardless of value, should not be interpreted as absolute evidence of the presence or absence of disease.          Failed - Last BP in normal range    BP Readings from Last 1 Encounters:  10/16/22 (!) 164/102         Passed - Valid encounter within last 12 months    Recent Outpatient Visits           1 month ago Other iron deficiency anemia   [facility] Steele Sizer, MD   3 months ago Uncontrolled hypertension   [facility] Steele Sizer, MD   4 months ago Uncontrolled hypertension   [facility] Steele Sizer, MD   6 months ago Uncontrolled hypertension   [facility] Steele Sizer, MD   11 months ago Acute cough   [facility] Steele Sizer, MD       Future Appointments             In 3 weeks Steele Sizer, MD [facility], [place]   In 1 month  [facility], [facility]

## 2022-12-01 NOTE — Progress Notes (Deleted)
Name: Jyrin Moriarty   MRN: [id number]    DOB: 22-Apr-1941   Date:12/01/2022       Progress Note  Subjective  Chief Complaint  Follow Up  HPI  HTN: he used to take norvasc but stopped years ago because developed angioedema, he was also given carvedilol and hydralazine but he stopped it on his own. Bystolic caused a rash He is currently taking Benazepril BID kist 10 mg and states is what works the best for him however his bp at home the other morning was 158/90's  he denies headaches, dizziness or chest pain. We tried lotensin TID but he states could not tolerate and is back on just half twice daily , since last visit in Sep , he is taking clonidine 0.1 mg , he started only at night and is now taking twice daily and bp at home has been around 160. He states he is nervous when he gets to our office. Advised to try taking it three times daily and if still above 140 we can go up to 0.2 mg dose  He has tried norvasc, bystolic, carvedilol, hydralazine, Hygroton, labetalol and maxzide We ordered labs for secondary causes of HTN but it was not done yet    Iron deficiency: negative fecal immnoessay done May 22 and it was negative, he has been taking iron supplementation, denies abdominal pain or change in appetite. We will check FIT test    Goiter:he has not been back to see endo, he has a goiter he is taking levothyroxine 75 mcg daily and half on sundays. . Denies dry skin or change in bowel movements, no palpitation . Last TSH at goal    Gout: he has been off Allopurinol for months and no gout episodes.    LUTS: going on for a while, initially noticed improvement with flomax, but stopped on his own because it stopped working after a while. He continues to have urinary frequency, urgency and nocturia, discussed referral to Urolgoist but he wants to hold off. He also has a history of high PSA. He is back on Flomax and he has noticed improvement of symptoms    Chronic low back pain with radiculitis: he  states symptoms of radiculitis resolved, he did not take lyrica, worried about side effects   Metabolic syndrome: we will monitor   Patient Active Problem List   Diagnosis Date Noted   Iron deficiency anemia 07/07/2019   History of TIAs 12/10/2018   HLD (hyperlipidemia) 11/02/2018   Intermittent low back pain 12/20/2016   Hyperglycemia 06/01/2016   Hypertension, benign 05/31/2016   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   Abnormal TSH 06/04/2015    Past Surgical History:  Procedure Laterality Date   COLONOSCOPY     CYST EXCISION Left 2008   Upper Back on Left Shoulder   ESOPHAGOGASTRODUODENOSCOPY (EGD) WITH PROPOFOL N/A 11/02/2018   Procedure: ESOPHAGOGASTRODUODENOSCOPY (EGD) WITH PROPOFOL;  Surgeon: Lucilla Lame, MD;  Location: [facility];  Service: Endoscopy;  Laterality: N/A;   HERNIA REPAIR     [facility]    INGUINAL HERNIA REPAIR  11/27/2011   Procedure: HERNIA REPAIR INGUINAL ADULT;  Surgeon: Belva Crome, MD;  Location: [place];  Service: General;  Laterality: Right;  right inguinal hernia repair with mesh    Family History  Problem Relation Age of Onset   Breast cancer Mother     Social History   Tobacco Use   Smoking status: Former    Types: Cigars    Quit date:  11/22/1988    Years since quitting: 34.0   Smokeless tobacco: Never  Substance Use Topics   Alcohol use: No    Comment: he quit 10/2016     Current Outpatient Medications:    benazepril (LOTENSIN) 20 MG tablet, TAKE 1/2 TABLET BY MOUTH IN THE MORNING AT NOON AND AT BEDTIME, Disp: 135 tablet, Rfl: 0   cloNIDine (CATAPRES) 0.1 MG tablet, TAKE 1 TABLET BY MOUTH EVERY DAY IN THE EVENING, Disp: 90 tablet, Rfl: 0   colchicine 0.6 MG tablet, Take 1 tablet (0.6 mg total) by mouth daily as needed. May take two pills two hours apart if needed for gout attack, Disp: 30 tablet, Rfl: 0   levothyroxine (SYNTHROID) 75 MCG tablet, TAKE 1 TABLET (75 MCG TOTAL) BY MOUTH DAILY BEFORE BREAKFAST. TAKE 1 & 1/2  TABLET ON 'SUNDAYS, Disp: 90 tablet, Rfl: 3   tamsulosin (FLOMAX) 0.4 MG CAPS capsule, TAKE 1 CAPSULE BY MOUTH EVERY DAY, Disp: 90 capsule, Rfl: 0   triamcinolone cream (KENALOG) 0.1 %, Apply 1 application topically 2 (two) times daily., Disp: 30 g, Rfl: 0  Allergies  Allergen Reactions   Aspirin     GI bleed    Bystolic [Nebivolol Hcl]     Rash after taking it for months    Norvasc [Amlodipine] Other (See Comments)    angioedema   Statins     Per patient GI bleed and upset stomach     I personally reviewed active problem list, medication list, allergies, family history, social history, health maintenance with the patient/caregiver today.   ROS  ***  Objective  There were no vitals filed for this visit.  There is no height or weight on file to calculate BMI.  Physical Exam ***  Recent Results (from the past 2160 hour(s))  Urine Microalbumin w/creat. ratio     Status: None   Collection Time: 10/02/22  2:48 PM  Result Value Ref Range   Creatinine, Urine 150 20 - 320 mg/dL   Microalb, Ur 0.8 mg/dL    Comment: Reference Range Not established    Microalb Creat Ratio 5 <30 mcg/mg creat    Comment: . The ADA defines abnormalities in albumin excretion as follows: . Albuminuria Category        Result (mcg/mg creatinine) . Normal to Mildly increased   <30 Moderately increased         30'$ -299  Severely increased           > OR = 300 . The ADA recommends that at least two of three specimens collected within a 3-6 month period be abnormal before considering a patient to be within a diagnostic category.   Fecal Globin By Immunochemistry     Status: None   Collection Time: 10/08/22 12:00 AM  Result Value Ref Range   MICRO NUMBER: [id number]    SPECIMEN QUALITY: Adequate    Source: INSURE (TM) FOBT TEST CARD    STATUS: FINAL    FECAL GLOBIN RESULT: Not Detected     PHQ2/9:    10/02/2022   11:51 AM 07/26/2022    2:47 PM 06/27/2022    3:15 PM 05/08/2022    2:29 PM  12/13/2021    2:57 PM  Depression screen PHQ 2/9  Decreased Interest 0 0 0 1 0  Down, Depressed, Hopeless 0 0 0 0 0  PHQ - 2 Score 0 0 0 1 0  Altered sleeping 0 0 0 0   Tired, decreased energy 0 0 0 1  Change in appetite 0 0 0 0   Feeling bad or failure about yourself  0 0 0 0   Trouble concentrating 0 0 0 0   Moving slowly or fidgety/restless 0 0 0 0   Suicidal thoughts 0 0 0 0   PHQ-9 Score 0 0 0 2     phq 9 is {gen pos [id number]   Fall Risk:    10/02/2022   11:51 AM 07/26/2022    2:46 PM 06/27/2022    3:15 PM 05/08/2022    2:29 PM 12/13/2021    3:00 PM  [place] in the past year? 0 0 0 0 0  Number falls in past yr:  0 0 0 0  Injury with Fall?  0 0 0 0  Risk for fall due to : No Fall Risks No Fall Risks No Fall Risks No Fall Risks No Fall Risks  Follow up Falls prevention discussed;Education provided;Falls evaluation completed Falls prevention discussed Falls prevention discussed Falls prevention discussed Falls prevention discussed      Functional Status Survey:      Assessment & Plan  *** There are no diagnoses linked to this encounter.

## 2022-12-04 ENCOUNTER — Ambulatory Visit: Payer: Medicare Other | Admitting: Family Medicine

## 2022-12-11 NOTE — Progress Notes (Unsigned)
Name: Dennis Ford   MRN: [id number]    DOB: Mar 29, 1941   Date:12/12/2022       Progress Note  Subjective  Chief Complaint  Follow Up  HPI  HTN: he used to take norvasc but stopped years ago because developed angioedema, he was also given carvedilol and hydralazine but he stopped it on his own. Bystolic caused a rash He is currently only taking one clonidine twice daily ( has not been taking TID ) and half benazepril in the mornings. BP at home seems to higher in the mornings, states wakes up with back and feels anxious but improves after physicla activity and when he relaxes. He denies headaches, dizziness or chest pain.   He has tried norvasc, bystolic, carvedilol, hydralazine, [place], labetalol and maxzide We ordered labs for secondary causes of HTN but it was not done yet - we will try to do it today    Iron deficiency: negative fecal immnoessay done May 22 and it was negative, he has been taking iron supplementation, denies abdominal pain or change in appetite. He had a negative FIT test    Goiter:he has not been back to see endo, he has a goiter he is taking levothyroxine 75 mcg daily and half on sundays. . Denies dry skin or change in bowel movements, no palpitation . Last level was normal    Gout: he has been off Allopurinol for months and no gout episodes. Unchanged    LUTS: going on for a while, initially noticed improvement with flomax, but stopped on his own because it stopped working after a while. He continues to have urinary frequency, urgency and nocturia, discussed referral to Urolgoist but he wants to hold off. He also has a history of high PSA. He is back on Flomax and he has noticed improvement of symptoms Unchanged     Chronic low back pain with radiculitis: he states symptoms of radiculitis resolved, he did not take lyrica, worried about side effects He states something is pushing on his back, after a removal of a cyst .   Metabolic syndrome: we will monitor .  Discussed low carb diet   He said he drinks liquor when stressed or to help himself sleep, but at most one shot once a day. Explained alcohol can cause bp to spike   Patient Active Problem List   Diagnosis Date Noted   Iron deficiency anemia 07/07/2019   History of TIAs 12/10/2018   HLD (hyperlipidemia) 11/02/2018   Intermittent low back pain 12/20/2016   Hyperglycemia 06/01/2016   Hypertension, benign 05/31/2016   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   Abnormal TSH 06/04/2015    Past Surgical History:  Procedure Laterality Date   COLONOSCOPY     CYST EXCISION Left 2008   Upper Back on Left Shoulder   ESOPHAGOGASTRODUODENOSCOPY (EGD) WITH PROPOFOL N/A 11/02/2018   Procedure: ESOPHAGOGASTRODUODENOSCOPY (EGD) WITH PROPOFOL;  Surgeon: Lucilla Lame, MD;  Location: [facility];  Service: Endoscopy;  Laterality: N/A;   HERNIA REPAIR     [facility]    INGUINAL HERNIA REPAIR  11/27/2011   Procedure: HERNIA REPAIR INGUINAL ADULT;  Surgeon: Belva Crome, MD;  Location: [facility];  Service: General;  Laterality: Right;  right inguinal hernia repair with mesh    Family History  Problem Relation Age of Onset   Breast cancer Mother     Social History   Tobacco Use   Smoking status: Former    Types: Cigars    Quit date: 11/22/1988  Years since quitting: 34.0   Smokeless tobacco: Never  Substance Use Topics   Alcohol use: No    Comment: he quit 10/2016     Current Outpatient Medications:    benazepril (LOTENSIN) 20 MG tablet, TAKE 1/2 TABLET BY MOUTH IN THE MORNING AT NOON AND AT BEDTIME, Disp: 135 tablet, Rfl: 0   busPIRone (BUSPAR) 5 MG tablet, Take 1 tablet (5 mg total) by mouth 2 (two) times daily as needed., Disp: 60 tablet, Rfl: 0   cloNIDine (CATAPRES) 0.1 MG tablet, TAKE 1 TABLET BY MOUTH EVERY DAY IN THE EVENING, Disp: 90 tablet, Rfl: 0   colchicine 0.6 MG tablet, Take 1 tablet (0.6 mg total) by mouth daily as needed. May take two pills two hours apart if needed for  gout attack, Disp: 30 tablet, Rfl: 0   levothyroxine (SYNTHROID) 75 MCG tablet, TAKE 1 TABLET (75 MCG TOTAL) BY MOUTH DAILY BEFORE BREAKFAST. TAKE 1 & 1/2 TABLET ON SUNDAYS, Disp: 90 tablet, Rfl: 3   tamsulosin (FLOMAX) 0.4 MG CAPS capsule, TAKE 1 CAPSULE BY MOUTH EVERY DAY, Disp: 90 capsule, Rfl: 0   triamcinolone cream (KENALOG) 0.1 %, Apply 1 application topically 2 (two) times daily., Disp: 30 g, Rfl: 0  Allergies  Allergen Reactions   Aspirin     GI bleed    Bystolic [Nebivolol Hcl]     Rash after taking it for months    Norvasc [Amlodipine] Other (See Comments)    angioedema   Statins     Per patient GI bleed and upset stomach     I personally reviewed active problem list, medication list, allergies, family history, social history, health maintenance with the patient/caregiver today.   ROS  Ten systems reviewed and is negative except as mentioned in HPI   Objective  Vitals:   12/12/22 1145 12/12/22 1151  BP: (!) 210/120 (!) 200/100  Pulse: 88   Resp: 16   Temp: 97.7 F (36.5 C)   TempSrc: Oral   SpO2: 98%   Weight: 183 lb 4.8 oz (83.1 kg)   Height: 5\' 7" (1.702 m)     Body mass index is 28.71 kg/m.  Physical Exam  Constitutional: Patient appears well-developed and well-nourished.  No distress.  HEENT: head atraumatic, normocephalic, pupils equal and reactive to light, neck supple Cardiovascular: Normal rate, regular rhythm and normal heart sounds.  No murmur heard. No BLE edema. Pulmonary/Chest: Effort normal and breath sounds normal. No respiratory distress. Abdominal: Soft.  There is no tenderness. Psychiatric: Patient has a normal mood and affect. behavior is normal. Judgment and thought content normal.   Recent Results (from the past 2160 hour(s))  Urine Microalbumin w/creat. ratio     Status: None   Collection Time: 10/02/22  2:48 PM  Result Value Ref Range   Creatinine, Urine 150 20 - 320 mg/dL   Microalb, Ur 0.8 mg/dL    Comment: Reference  Range Not established    Microalb Creat Ratio 5 <30 mcg/mg creat    Comment: . The ADA defines abnormalities in albumin excretion as follows: . Albuminuria Category        Result (mcg/mg creatinine) . Normal to Mildly increased   <30 Moderately increased         30 -299  Severely increased           > OR = 300 . The ADA recommends that at least two of three specimens collected within a 3-6 month period be abnormal before considering a patient to be  within a diagnostic category.   Fecal Globin By Immunochemistry     Status: None   Collection Time: 10/08/22 12:00 AM  Result Value Ref Range   MICRO NUMBER: [id number]    SPECIMEN QUALITY: Adequate    Source: INSURE (TM) FOBT TEST CARD    STATUS: FINAL    FECAL GLOBIN RESULT: Not Detected     PHQ2/9:    12/12/2022   11:47 AM 10/02/2022   11:51 AM 07/26/2022    2:47 PM 06/27/2022    3:15 PM 05/08/2022    2:29 PM  Depression screen PHQ 2/9  Decreased Interest 1 0 0 0 1  Down, Depressed, Hopeless 0 0 0 0 0  PHQ - 2 Score 1 0 0 0 1  Altered sleeping 0 0 0 0 0  Tired, decreased energy 0 0 0 0 1  Change in appetite 0 0 0 0 0  Feeling bad or failure about yourself  0 0 0 0 0  Trouble concentrating 0 0 0 0 0  Moving slowly or fidgety/restless 0 0 0 0 0  Suicidal thoughts 0 0 0 0 0  PHQ-9 Score 1 0 0 0 2  Difficult doing work/chores Not difficult at all        phq 9 is negative   Fall Risk:    12/12/2022   11:47 AM 10/02/2022   11:51 AM 07/26/2022    2:46 PM 06/27/2022    3:15 PM 05/08/2022    2:29 PM  Fall Risk   Falls in the past year? 0 0 0 0 0  Number falls in past yr:   0 0 0  Injury with Fall?   0 0 0  Risk for fall due to : No Fall Risks No Fall Risks No Fall Risks No Fall Risks No Fall Risks  Follow up Falls prevention discussed Falls prevention discussed;Education provided;Falls evaluation completed Falls prevention discussed Falls prevention discussed Falls prevention discussed      Functional Status Survey: Is  the patient deaf or have difficulty hearing?: No Does the patient have difficulty seeing, even when wearing glasses/contacts?: No Does the patient have difficulty concentrating, remembering, or making decisions?: No Does the patient have difficulty walking or climbing stairs?: No Does the patient have difficulty dressing or bathing?: No Does the patient have difficulty doing errands alone such as visiting a doctor's office or shopping?: No    Assessment & Plan  1. Uncontrolled hypertension  He will have labs done today, advised him not to drink alcohol or smoke marijuana when stressed, we will try buspar instead   2. History of TIA (transient ischemic attack)   3. Acquired hypothyroidism  Last TSH at goal   4. Controlled gout   5. Metabolic syndrome   6. Other neutropenia ([place])  We will recheck next visit   7. Iron deficiency anemia, unspecified iron deficiency anemia type  FIT test negative   8. Chronic left-sided low back pain without sciatica  Stable   9. Anxiety  - busPIRone (BUSPAR) 5 MG tablet; Take 1 tablet (5 mg total) by mouth 2 (two) times daily as needed.  Dispense: 60 tablet; Refill: 0

## 2022-12-12 ENCOUNTER — Encounter: Payer: Self-pay | Admitting: Family Medicine

## 2022-12-12 ENCOUNTER — Ambulatory Visit (INDEPENDENT_AMBULATORY_CARE_PROVIDER_SITE_OTHER): Payer: Medicare Other | Admitting: Family Medicine

## 2022-12-12 VITALS — BP 190/110 | HR 88 | Temp 97.7°F | Resp 16 | Ht 67.0 in | Wt 183.3 lb

## 2022-12-12 DIAGNOSIS — F419 Anxiety disorder, unspecified: Secondary | ICD-10-CM

## 2022-12-12 DIAGNOSIS — M109 Gout, unspecified: Secondary | ICD-10-CM | POA: Diagnosis not present

## 2022-12-12 DIAGNOSIS — D708 Other neutropenia: Secondary | ICD-10-CM

## 2022-12-12 DIAGNOSIS — E8881 Metabolic syndrome: Secondary | ICD-10-CM | POA: Diagnosis not present

## 2022-12-12 DIAGNOSIS — I10 Essential (primary) hypertension: Secondary | ICD-10-CM | POA: Diagnosis not present

## 2022-12-12 DIAGNOSIS — D509 Iron deficiency anemia, unspecified: Secondary | ICD-10-CM

## 2022-12-12 DIAGNOSIS — G8929 Other chronic pain: Secondary | ICD-10-CM | POA: Diagnosis not present

## 2022-12-12 DIAGNOSIS — Z8673 Personal history of transient ischemic attack (TIA), and cerebral infarction without residual deficits: Secondary | ICD-10-CM | POA: Diagnosis not present

## 2022-12-12 DIAGNOSIS — E039 Hypothyroidism, unspecified: Secondary | ICD-10-CM

## 2022-12-12 DIAGNOSIS — M545 Low back pain, unspecified: Secondary | ICD-10-CM

## 2022-12-12 MED ORDER — BUSPIRONE HCL 5 MG PO TABS: 5.0000 mg | ORAL_TABLET | Freq: Two times a day (BID) | ORAL | 0 refills | Status: DC | PRN

## 2022-12-14 ENCOUNTER — Telehealth: Payer: Self-pay

## 2022-12-14 ENCOUNTER — Ambulatory Visit: Payer: Medicare Other

## 2022-12-14 NOTE — Telephone Encounter (Signed)
Pt had MWV at @:50pm today. I called pt: Left Message - pt scheduled for MWV:lft msg to rtn call [phone].  No Answer/Busy - called to do [place] w/pt:no vm available to lv msg (mailbox full).blb

## 2022-12-21 ENCOUNTER — Ambulatory Visit (INDEPENDENT_AMBULATORY_CARE_PROVIDER_SITE_OTHER): Payer: Medicare Other

## 2022-12-21 VITALS — Ht 67.0 in | Wt 183.0 lb

## 2022-12-21 DIAGNOSIS — Z0000 Encounter for general adult medical examination without abnormal findings: Secondary | ICD-10-CM | POA: Diagnosis not present

## 2022-12-21 NOTE — Patient Instructions (Signed)
Dennis Ford , Thank you for taking time to come for your Medicare Wellness Visit. I appreciate your ongoing commitment to your health goals. Please review the following plan we discussed and let me know if I can assist you in the future.   These are the goals we discussed:  Goals      Patient Stated     Pt would like to maintain healthy blood pressure.         This is a list of the screening recommended for you and due dates:  Health Maintenance  Topic Date Due   COVID-19 Vaccine (3 - Pfizer risk series) 08/04/2020   Zoster (Shingles) Vaccine (1 of 2) 12/28/2022*   Flu Shot  02/04/2023*   DTaP/Tdap/Td vaccine (2 - Td or Tdap) 10/17/2023   Medicare Annual Wellness Visit  12/22/2023   Pneumonia Vaccine  Completed   HPV Vaccine  Aged Out  *Topic was postponed. The date shown is not the original due date.    Advanced directives: no  Conditions/risks identified: none  Next appointment: Follow up in one year for your annual wellness visit. 12/25/2023 @10 :15am telephone  Preventive Care 50 Years and Older, Male  Preventive care refers to lifestyle choices and visits with your health care provider that can promote health and wellness. What does preventive care include? A yearly physical exam. This is also called an annual well check. Dental exams once or twice a year. Routine eye exams. Ask your health care provider how often you should have your eyes checked. Personal lifestyle choices, including: Daily care of your teeth and gums. Regular physical activity. Eating a healthy diet. Avoiding tobacco and drug use. Limiting alcohol use. Practicing safe sex. Taking low doses of aspirin every day. Taking vitamin and mineral supplements as recommended by your health care provider. What happens during an annual well check? The services and screenings done by your health care provider during your annual well check will depend on your age, overall health, lifestyle risk factors, and  family history of disease. Counseling  Your health care provider may ask you questions about your: Alcohol use. Tobacco use. Drug use. Emotional well-being. Home and relationship well-being. Sexual activity. Eating habits. History of falls. Memory and ability to understand (cognition). Work and work Statistician. Screening  You may have the following tests or measurements: Height, weight, and BMI. Blood pressure. Lipid and cholesterol levels. These may be checked every 5 years, or more frequently if you are over 12 years old. Skin check. Lung cancer screening. You may have this screening every year starting at age 37 if you have a 30-pack-year history of smoking and currently smoke or have quit within the past 15 years. Fecal occult blood test (FOBT) of the stool. You may have this test every year starting at age 55. Flexible sigmoidoscopy or colonoscopy. You may have a sigmoidoscopy every 5 years or a colonoscopy every 10 years starting at age 71. Prostate cancer screening. Recommendations will vary depending on your family history and other risks. Hepatitis C blood test. Hepatitis B blood test. Sexually transmitted disease (STD) testing. Diabetes screening. This is done by checking your blood sugar (glucose) after you have not eaten for a while (fasting). You may have this done every 1-3 years. Abdominal aortic aneurysm (AAA) screening. You may need this if you are a current or former smoker. Osteoporosis. You may be screened starting at age 21 if you are at high risk. Talk with your health care provider about your test results,  treatment options, and if necessary, the need for more tests. Vaccines  Your health care provider may recommend certain vaccines, such as: Influenza vaccine. This is recommended every year. Tetanus, diphtheria, and acellular pertussis (Tdap, Td) vaccine. You may need a Td booster every 10 years. Zoster vaccine. You may need this after age 18. Pneumococcal  13-valent conjugate (PCV13) vaccine. One dose is recommended after age 62. Pneumococcal polysaccharide (PPSV23) vaccine. One dose is recommended after age 76. Talk to your health care provider about which screenings and vaccines you need and how often you need them. This information is not intended to replace advice given to you by your health care provider. Make sure you discuss any questions you have with your health care provider. Document Released: 11/19/2015 Document Revised: 07/12/2016 Document Reviewed: 08/24/2015 Elsevier Interactive Patient Education  2017 [place] Prevention in the Home Falls can cause injuries. They can happen to people of all ages. There are many things you can do to make your home safe and to help prevent falls. What can I do on the outside of my home? Regularly fix the edges of walkways and driveways and fix any cracks. Remove anything that might make you trip as you walk through a door, such as a raised step or threshold. Trim any bushes or trees on the path to your home. Use bright outdoor lighting. Clear any walking paths of anything that might make someone trip, such as rocks or tools. Regularly check to see if handrails are loose or broken. Make sure that both sides of any steps have handrails. Any raised decks and porches should have guardrails on the edges. Have any leaves, snow, or ice cleared regularly. Use sand or salt on walking paths during winter. Clean up any spills in your garage right away. This includes oil or grease spills. What can I do in the bathroom? Use night lights. Install grab bars by the toilet and in the tub and shower. Do not use towel bars as grab bars. Use non-skid mats or decals in the tub or shower. If you need to sit down in the shower, use a plastic, non-slip stool. Keep the floor dry. Clean up any water that spills on the floor as soon as it happens. Remove soap buildup in the tub or shower regularly. Attach  bath mats securely with double-sided non-slip rug tape. Do not have throw rugs and other things on the floor that can make you trip. What can I do in the bedroom? Use night lights. Make sure that you have a light by your bed that is easy to reach. Do not use any sheets or blankets that are too big for your bed. They should not hang down onto the floor. Have a firm chair that has side arms. You can use this for support while you get dressed. Do not have throw rugs and other things on the floor that can make you trip. What can I do in the kitchen? Clean up any spills right away. Avoid walking on wet floors. Keep items that you use a lot in easy-to-reach places. If you need to reach something above you, use a strong step stool that has a grab bar. Keep electrical cords out of the way. Do not use floor polish or wax that makes floors slippery. If you must use wax, use non-skid floor wax. Do not have throw rugs and other things on the floor that can make you trip. What can I do with my stairs? Do  not leave any items on the stairs. Make sure that there are handrails on both sides of the stairs and use them. Fix handrails that are broken or loose. Make sure that handrails are as long as the stairways. Check any carpeting to make sure that it is firmly attached to the stairs. Fix any carpet that is loose or worn. Avoid having throw rugs at the top or bottom of the stairs. If you do have throw rugs, attach them to the floor with carpet tape. Make sure that you have a light switch at the top of the stairs and the bottom of the stairs. If you do not have them, ask someone to add them for you. What else can I do to help prevent falls? Wear shoes that: Do not have high heels. Have rubber bottoms. Are comfortable and fit you well. Are closed at the toe. Do not wear sandals. If you use a stepladder: Make sure that it is fully opened. Do not climb a closed stepladder. Make sure that both sides of the  stepladder are locked into place. Ask someone to hold it for you, if possible. Clearly mark and make sure that you can see: Any grab bars or handrails. First and last steps. Where the edge of each step is. Use tools that help you move around (mobility aids) if they are needed. These include: Canes. Walkers. Scooters. Crutches. Turn on the lights when you go into a dark area. Replace any light bulbs as soon as they burn out. Set up your furniture so you have a clear path. Avoid moving your furniture around. If any of your floors are uneven, fix them. If there are any pets around you, be aware of where they are. Review your medicines with your doctor. Some medicines can make you feel dizzy. This can increase your chance of falling. Ask your doctor what other things that you can do to help prevent falls. This information is not intended to replace advice given to you by your health care provider. Make sure you discuss any questions you have with your health care provider. Document Released: 08/19/2009 Document Revised: 03/30/2016 Document Reviewed: 11/27/2014 Elsevier Interactive Patient Education  2017 [employer].

## 2022-12-21 NOTE — Progress Notes (Signed)
I connected with  Gwynneth Macleod on 12/21/22 by a audio enabled telemedicine application and verified that I am speaking with the correct person using two identifiers.  Patient Location: Home  Provider Location: Office/Clinic  I discussed the limitations of evaluation and management by telemedicine. The patient expressed understanding and agreed to proceed.  Subjective:   Dennis Ford is a 82 y.o. male who presents for Medicare Annual/Subsequent preventive examination.  Review of Systems       Objective:    Today's Vitals   12/21/22 1124  Weight: 183 lb (83 kg)  Height: 5' 7"$  (1.702 m)   Body mass index is 28.66 kg/m.     12/21/2022   11:36 AM 12/13/2021    2:59 PM 12/07/2020    9:33 AM 08/07/2019    8:22 AM 11/02/2018    4:35 AM 11/01/2018    8:52 PM 10/29/2018   12:00 AM  Advanced Directives  Does Patient Have a Medical Advance Directive? No No No No No No No  Would patient like information on creating a medical advance directive? No - Patient declined Yes (MAU/Ambulatory/Procedural Areas - Information given) No - Patient declined Yes (MAU/Ambulatory/Procedural Areas - Information given) No - Patient declined  No - Patient declined    Current Medications (verified) Outpatient Encounter Medications as of 12/21/2022  Medication Sig   benazepril (LOTENSIN) 20 MG tablet TAKE 1/2 TABLET BY MOUTH IN THE MORNING AT NOON AND AT BEDTIME   cloNIDine (CATAPRES) 0.1 MG tablet TAKE 1 TABLET BY MOUTH EVERY DAY IN THE EVENING   colchicine 0.6 MG tablet Take 1 tablet (0.6 mg total) by mouth daily as needed. May take two pills two hours apart if needed for gout attack   levothyroxine (SYNTHROID) 75 MCG tablet TAKE 1 TABLET (75 MCG TOTAL) BY MOUTH DAILY BEFORE BREAKFAST. TAKE 1 & 1/2 TABLET ON SUNDAYS   tamsulosin (FLOMAX) 0.4 MG CAPS capsule TAKE 1 CAPSULE BY MOUTH EVERY DAY   triamcinolone cream (KENALOG) 0.1 % Apply 1 application topically 2 (two) times daily.   busPIRone (BUSPAR) 5 MG  tablet Take 1 tablet (5 mg total) by mouth 2 (two) times daily as needed. (Patient not taking: Reported on 12/21/2022)   No facility-administered encounter medications on file as of 12/21/2022.    Allergies (verified) Aspirin, Bystolic [nebivolol hcl], Norvasc [amlodipine], and Statins   History: Past Medical History:  Diagnosis Date   Diabetes mellitus without complication ([place])    Goiter    History of inguinal hernia repair    Hyperlipidemia    Hypertension    Past Surgical History:  Procedure Laterality Date   COLONOSCOPY     CYST EXCISION Left 2008   Upper Back on Left Shoulder   ESOPHAGOGASTRODUODENOSCOPY (EGD) WITH PROPOFOL N/A 11/02/2018   Procedure: ESOPHAGOGASTRODUODENOSCOPY (EGD) WITH PROPOFOL;  Surgeon: Lucilla Lame, MD;  Location: [facility] ENDOSCOPY;  Service: Endoscopy;  Laterality: N/A;   HERNIA REPAIR     LIH    INGUINAL HERNIA REPAIR  11/27/2011   Procedure: HERNIA REPAIR INGUINAL ADULT;  Surgeon: Belva Crome, MD;  Location: [place];  Service: General;  Laterality: Right;  right inguinal hernia repair with mesh   Family History  Problem Relation Age of Onset   Breast cancer Mother    Social History   Socioeconomic History   Marital status: Married    Spouse name: Not on file   Number of children: 2   Years of education: Not on file   Highest  education level: 10th grade  Occupational History   Occupation: retired  Tobacco Use   Smoking status: Former    Types: Cigars    Quit date: 11/22/1988    Years since quitting: 34.1   Smokeless tobacco: Never  Vaping Use   Vaping Use: Never used  Substance and Sexual Activity   Alcohol use: No    Comment: he quit 10/2016   Drug use: No   Sexual activity: Yes    Partners: Female  Other Topics Concern   Not on file  Social History Narrative   Not on file   Social Determinants of Health   Financial Resource Strain: Low Risk  (12/21/2022)   Overall Financial Resource Strain (CARDIA)     Difficulty of Paying Living Expenses: Not hard at all  Food Insecurity: No Food Insecurity (12/13/2021)   Hunger Vital Sign    Worried About Running Out of Food in the Last Year: Never true    [place] in the Last Year: Never true  Transportation Needs: No Transportation Needs (12/21/2022)   PRAPARE - Hydrologist (Medical): No    Lack of Transportation (Non-Medical): No  Physical Activity: Insufficiently Active (12/21/2022)   Exercise Vital Sign    Days of Exercise per Week: 3 days    Minutes of Exercise per Session: 30 min  Stress: No Stress Concern Present (12/21/2022)   [place]    Feeling of Stress : Not at all  Social Connections: Moderately Isolated (12/21/2022)   Social Connection and Isolation Panel [NHANES]    Frequency of Communication with Friends and Family: More than three times a week    Frequency of Social Gatherings with Friends and Family: More than three times a week    Attends Religious Services: Never    Marine scientist or Organizations: No    Attends Music therapist: Never    Marital Status: Married    Tobacco Counseling Counseling given: Not Answered   Clinical Intake:  Pre-visit preparation completed: Yes  Pain : No/denies pain     BMI - recorded: 28.66 Nutritional Status: BMI 25 -29 Overweight Nutritional Risks: None Diabetes: No  How often do you need to have someone help you when you read instructions, pamphlets, or other written materials from your doctor or pharmacy?: 1 - Never  Diabetic?no  Interpreter Needed?: No  Information entered by :: B.Carlesha Seiple,LPN   Activities of Daily Living    12/21/2022   11:37 AM 12/12/2022   11:47 AM  In your present state of health, do you have any difficulty performing the following activities:  Hearing? 0 0  Vision? 0 0  Difficulty concentrating or making decisions? 0 0  Walking or  climbing stairs? 0 0  Dressing or bathing? 0 0  Doing errands, shopping? 0 0  Preparing Food and eating ? N   Using the Toilet? N   In the past six months, have you accidently leaked urine? N   Do you have problems with loss of bowel control? N   Managing your Medications? N   Managing your Finances? N   Housekeeping or managing your Housekeeping? N     Patient Care Team: Steele Sizer, MD as PCP - General (Family Medicine)  Indicate any recent Medical Services you may have received from other than [facility] providers in the past year (date may be approximate).     Assessment:   This  is a routine wellness examination for Dennis Ford.  Hearing/Vision screen Hearing Screening - Comments:: Adequate hearing Vision Screening - Comments:: Adequate vision.no glasses: no exam lately;feels no need  Dietary issues and exercise activities discussed: Exercise limited by: None identified;neurologic condition(s)   Goals Addressed             This Visit's Progress    Patient Stated   On track    Pt would like to maintain healthy blood pressure.        Depression Screen    12/21/2022   11:30 AM 12/12/2022   11:47 AM 10/02/2022   11:51 AM 07/26/2022    2:47 PM 06/27/2022    3:15 PM 05/08/2022    2:29 PM 12/13/2021    2:57 PM  PHQ 2/9 Scores  PHQ - 2 Score 1 1 0 0 0 1 0  PHQ- 9 Score 1 1 0 0 0 2     Fall Risk    12/21/2022   11:27 AM 12/12/2022   11:47 AM 10/02/2022   11:51 AM 07/26/2022    2:46 PM 06/27/2022    3:15 PM  Fall Risk   Falls in the past year? 0 0 0 0 0  Number falls in past yr: 0   0 0  Injury with Fall? 0   0 0  Risk for fall due to : No Fall Risks No Fall Risks No Fall Risks No Fall Risks No Fall Risks  Follow up Education provided;Falls prevention discussed Falls prevention discussed Falls prevention discussed;Education provided;Falls evaluation completed Falls prevention discussed Falls prevention discussed    FALL RISK PREVENTION PERTAINING TO THE HOME:  Any stairs  in or around the home? No  If so, are there any without handrails? No  Home free of loose throw rugs in walkways, pet beds, electrical cords, etc? Yes  Adequate lighting in your home to reduce risk of falls? Yes   ASSISTIVE DEVICES UTILIZED TO PREVENT FALLS:  Life alert? No  Use of a cane, walker or w/c? No  Grab bars in the bathroom? No  Shower chair or bench in shower? No  Elevated toilet seat or a handicapped toilet? No   T  Cognitive Function:        12/21/2022   11:38 AM 12/07/2020    9:35 AM 08/07/2019    8:27 AM  6CIT Screen  What Year? 0 points 0 points 0 points  What month? 0 points 0 points 0 points  What time? 0 points 0 points 0 points  Count back from 20 0 points 0 points 0 points  Months in reverse 4 points 2 points 0 points  Repeat phrase 10 points 6 points 6 points  Total Score 14 points 8 points 6 points    Immunizations Immunization History  Administered Date(s) Administered   PFIZER(Purple Top)SARS-COV-2 Vaccination 06/16/2020, 07/07/2020   Pneumococcal Conjugate-13 05/31/2016   Pneumococcal Polysaccharide-23 01/31/2013   Tdap 10/16/2013   Zoster, Live 07/09/2008    TDAP status: Up to date  Flu Vaccine status: Up to date  Pneumococcal vaccine status: Up to date  Covid-19 vaccine status: Completed vaccines  Qualifies for Shingles Vaccine? Yes   Zostavax completed No   Shingrix Completed?: No.    Education has been provided regarding the importance of this vaccine. Patient has been advised to call insurance company to determine out of pocket expense if they have not yet received this vaccine. Advised may also receive vaccine at local pharmacy or Health Dept. Verbalized acceptance  and understanding.  Screening Tests Health Maintenance  Topic Date Due   COVID-19 Vaccine (3 - Pfizer risk series) 08/04/2020   Zoster Vaccines- Shingrix (1 of 2) 12/28/2022 (Originally 03/27/1960)   INFLUENZA VACCINE  02/04/2023 (Originally 06/06/2022)   DTaP/Tdap/Td (2  - Td or Tdap) 10/17/2023   Medicare Annual Wellness (AWV)  12/22/2023   Pneumonia Vaccine 53+ Years old  Completed   HPV VACCINES  Aged Out    Health Maintenance  Health Maintenance Due  Topic Date Due   COVID-19 Vaccine (3 - Pfizer risk series) 08/04/2020    Colorectal cancer screening: No longer required.   Lung Cancer Screening: (Low Dose CT Chest recommended if Age 8-80 years, 30 pack-year currently smoking OR have quit w/in 15years.) does not qualify.   Lung Cancer Screening Referral: no  Additional Screening:  Hepatitis C Screening: does not qualify; Completed no  Vision Screening: Recommended annual ophthalmology exams for early detection of glaucoma and other disorders of the eye. Is the patient up to date with their annual eye exam?  No  Who is the provider or what is the name of the office in which the patient attends annual eye exams? Someone in [place] If pt is not established with a provider, would they like to be referred to a provider to establish care? No .   Dental Screening: Recommended annual dental exams for proper oral hygiene  Community Resource Referral / Chronic Care Management: CRR required this visit?  No   CCM required this visit?  No      Plan:     I have personally reviewed and noted the following in the patient's chart:   Medical and social history Use of alcohol, tobacco or illicit drugs  Current medications and supplements including opioid prescriptions. Patient is not currently taking opioid prescriptions. Functional ability and status Nutritional status Physical activity Advanced directives List of other physicians Hospitalizations, surgeries, and ER visits in previous 12 months Vitals Screenings to include cognitive, depression, and falls Referrals and appointments  In addition, I have reviewed and discussed with patient certain preventive protocols, quality metrics, and best practice recommendations. A written personalized  care plan for preventive services as well as general preventive health recommendations were provided to patient.     Roger Shelter, LPN   [id number]   Nurse Notes: pt states he is doing well:has no concerns or questions. Excited to be able to get out with weather warming.

## 2022-12-31 LAB — ALDOSTERONE + RENIN ACTIVITY W/ RATIO
ALDO / PRA Ratio: 3.2 Ratio (ref 0.9–28.9)
Aldosterone: 1 ng/dL
Renin Activity: 0.31 ng/mL/h (ref 0.25–5.82)

## 2022-12-31 LAB — MICROALBUMIN / CREATININE URINE RATIO
Creatinine, Urine: 34 mg/dL (ref 20–320)
Microalb Creat Ratio: 29 mcg/mg creat (ref <30)
Microalb, Ur: 1 mg/dL

## 2022-12-31 LAB — CATECHOLAMINES, FRACTIONATED, PLASMA
Dopamine: 24 pg/mL
Epinephrine: 50 pg/mL
Norepinephrine: 313 pg/mL
Total Catecholamines: 387 pg/mL

## 2023-01-03 ENCOUNTER — Other Ambulatory Visit: Payer: Self-pay | Admitting: Family Medicine

## 2023-01-03 ENCOUNTER — Encounter: Payer: Self-pay | Admitting: Family Medicine

## 2023-01-03 DIAGNOSIS — F419 Anxiety disorder, unspecified: Secondary | ICD-10-CM | POA: Insufficient documentation

## 2023-01-09 ENCOUNTER — Other Ambulatory Visit: Payer: Self-pay | Admitting: Family Medicine

## 2023-01-09 DIAGNOSIS — I10 Essential (primary) hypertension: Secondary | ICD-10-CM

## 2023-01-23 NOTE — Progress Notes (Unsigned)
Name: Dennis Ford   MRN: [id number]    DOB: [birth year]   Date:01/24/2023       Progress Note  Subjective  Chief Complaint  Follow Up  HPI  HTN: he used to take norvasc but stopped years ago because developed angioedema, he was also given carvedilol and hydralazine but he stopped it on his own. Bystolic caused a rash He is currently only taking one clonidine twice daily ( has not been taking TID ) and half benazepril now he is up to half twice daily.  BP at home seems to higher in the mornings. BP is a little better and he is willing to increase clonidine to TID and will return in one month for follow up. We checked labs for secondary hypertension in Feb 2024 and it was negative   He has tried norvasc, bystolic, carvedilol, hydralazine, [place], labetalol and maxzide  Iron deficiency: negative fecal immunoassay negative again 10/2022  he has been taking iron supplementation, denies abdominal pain or change in appetite. We will recheck labs today    Goiter:he has not been back to see endo, he has a goiter he is taking levothyroxine 75 mcg daily and half on sundays. . Denies dry skin or change in bowel movements, no palpitation but he has noticed that he feels jittery 20 minutes after taking levothyroxine in am's. He also has some neck fullness at night    Gout: he has been off Allopurinol for months and no gout episodes. Unchanged    LUTS: going on for a while, initially noticed improvement with flomax, but stopped on his own because it stopped working after a while. He continues to have urinary frequency, urgency and nocturia, discussed referral to Urolgoist but he wants to hold off. He also has a history of high PSA. He is back on Flomax and symptoms are stable   Metabolic syndrome: we will monitor . Discussed low carb diet , also explained he may be feeling jittery in am due to hypoglycemia and can try taking levothyroxine 2 hours after dinner   History of TIA: he has tried statin therapy  but it cause myopathy   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   Abnormal TSH 06/04/2015    Past Surgical History:  Procedure Laterality Date   COLONOSCOPY     CYST EXCISION Left 2008   Upper Back on Left Shoulder   ESOPHAGOGASTRODUODENOSCOPY (EGD) WITH PROPOFOL N/A 11/02/2018   Procedure: ESOPHAGOGASTRODUODENOSCOPY (EGD) WITH PROPOFOL;  Surgeon: Lucilla Lame, MD;  Location: [facility] ENDOSCOPY;  Service: Endoscopy;  Laterality: N/A;   HERNIA REPAIR     LIH    INGUINAL HERNIA REPAIR  11/27/2011   Procedure: HERNIA REPAIR INGUINAL ADULT;  Surgeon: Belva Crome, MD;  Location: [place];  Service: General;  Laterality: Right;  right inguinal hernia repair with mesh    Family History  Problem Relation Age of Onset   Breast cancer Mother     Social History   Tobacco Use   Smoking status: Former    Types: Cigars    Quit date: 11/22/1988    Years since quitting: 34.1   Smokeless tobacco: Never  Substance Use Topics   Alcohol use: No    Comment: he  quit 10/2016     Current Outpatient Medications:    benazepril (LOTENSIN) 20 MG tablet, TAKE 0.5 TABS BY MOUTH IN THE MORNING, NOON, AND AT BEDTIME., Disp: 135 tablet, Rfl: 0   busPIRone (BUSPAR) 5 MG tablet, TAKE 1 TABLET BY MOUTH 2 TIMES DAILY AS NEEDED., Disp: 180 tablet, Rfl: 1   cloNIDine (CATAPRES) 0.1 MG tablet, TAKE 1 TABLET BY MOUTH EVERY DAY IN THE EVENING, Disp: 90 tablet, Rfl: 0   colchicine 0.6 MG tablet, Take 1 tablet (0.6 mg total) by mouth daily as needed. May take two pills two hours apart if needed for gout attack, Disp: 30 tablet, Rfl: 0   levothyroxine (SYNTHROID) 75 MCG tablet, TAKE 1 TABLET (75 MCG TOTAL) BY MOUTH DAILY BEFORE  BREAKFAST. TAKE 1 & 1/2 TABLET ON SUNDAYS, Disp: 90 tablet, Rfl: 3   tamsulosin (FLOMAX) 0.4 MG CAPS capsule, TAKE 1 CAPSULE BY MOUTH EVERY DAY, Disp: 90 capsule, Rfl: 0   triamcinolone cream (KENALOG) 0.1 %, Apply 1 application topically 2 (two) times daily., Disp: 30 g, Rfl: 0  Allergies  Allergen Reactions   Aspirin     GI bleed    Bystolic [Nebivolol Hcl]     Rash after taking it for months    Norvasc [Amlodipine] Other (See Comments)    angioedema   Statins     Per patient GI bleed and upset stomach     I personally reviewed active problem list, medication list, allergies, family history, social history, health maintenance with the patient/caregiver today.   ROS  Ten systems reviewed and is negative except as mentioned in HPI   Objective  Vitals:   01/24/23 1135 01/24/23 1148  BP: (!) 168/98 (!) 160/96  Pulse: 93   Resp: 16   SpO2: 97%   Weight: 183 lb (83 kg)   Height: 5\' 7"  (1.702 m)     Body mass index is 28.66 kg/m.  Physical Exam  Constitutional: Patient appears well-developed and well-nourished.  No distress.  HEENT: head atraumatic, normocephalic, pupils equal and reactive to light,, neck supple, large goiter  Cardiovascular: Normal rate, regular rhythm and normal heart sounds.  No murmur heard. No BLE edema. Pulmonary/Chest: Effort normal and breath sounds normal. No respiratory distress. Abdominal: Soft.  There is no tenderness. Psychiatric: Patient has a normal mood and affect. behavior is normal. Judgment and thought content normal.    PHQ2/9:    01/24/2023   11:35 AM 12/21/2022   11:30 AM 12/12/2022   11:47 AM 10/02/2022   11:51 AM 07/26/2022    2:47 PM  Depression screen PHQ 2/9  Decreased Interest 0 1 1 0 0  Down, Depressed, Hopeless 0 0 0 0 0  PHQ - 2 Score 0 1 1 0 0  Altered sleeping 0 0 0 0 0  Tired, decreased energy 0 0 0 0 0  Change in appetite 0 0 0 0 0  Feeling bad or failure about yourself  0 0 0 0 0  Trouble concentrating 0 0 0 0  0  Moving slowly or fidgety/restless 0 0 0 0 0  Suicidal thoughts 0 0 0 0 0  PHQ-9 Score 0 1 1 0 0  Difficult doing work/chores  Not difficult at all Not difficult at all      phq 9 is negative   Fall Risk:    01/24/2023   11:34 AM 12/21/2022   11:27 AM 12/12/2022   11:47 AM 10/02/2022   11:51 AM 07/26/2022    2:46  PM  [place] in the past year? 0 0 0 0 0  Number falls in past yr: 0 0   0  Injury with Fall? 0 0   0  Risk for fall due to : No Fall Risks No Fall Risks No Fall Risks No Fall Risks No Fall Risks  Follow up Falls prevention discussed Education provided;Falls prevention discussed Falls prevention discussed Falls prevention discussed;Education provided;Falls evaluation completed Falls prevention discussed      Functional Status Survey: Is the patient deaf or have difficulty hearing?: No Does the patient have difficulty seeing, even when wearing glasses/contacts?: No Does the patient have difficulty concentrating, remembering, or making decisions?: No Does the patient have difficulty walking or climbing stairs?: No Does the patient have difficulty dressing or bathing?: No Does the patient have difficulty doing errands alone such as visiting a doctor's office or shopping?: No    Assessment & Plan  1. Acquired hypothyroidism  - TSH  2. Uncontrolled hypertension  - COMPLETE METABOLIC PANEL WITH GFR - cloNIDine (CATAPRES) 0.1 MG tablet; Take 1 tablet (0.1 mg total) by mouth 3 (three) times daily.  Dispense: 270 tablet; Refill: 0  3. History of TIA (transient ischemic attack)  Needs to take statin but unable to tolerate it  4. Iron deficiency anemia, unspecified iron deficiency anemia type  - CBC with Differential/Platelet - Iron, TIBC and Ferritin Panel  5. Goiter  - TSH - US THYROID; Future  6. Statin myopathy   7. Metabolic syndrome   8. Controlled gout   9. Lower urinary tract symptoms (LUTS)  - tamsulosin (FLOMAX) 0.4 MG CAPS  capsule; Take 1 capsule (0.4 mg total) by mouth daily.  Dispense: 90 capsule; Refill: 1

## 2023-01-24 ENCOUNTER — Ambulatory Visit (INDEPENDENT_AMBULATORY_CARE_PROVIDER_SITE_OTHER): Payer: Medicare Other | Admitting: Family Medicine

## 2023-01-24 ENCOUNTER — Encounter: Payer: Self-pay | Admitting: Family Medicine

## 2023-01-24 VITALS — BP 162/98 | HR 93 | Resp 16 | Ht 67.0 in | Wt 183.0 lb

## 2023-01-24 DIAGNOSIS — G720 Drug-induced myopathy: Secondary | ICD-10-CM | POA: Diagnosis not present

## 2023-01-24 DIAGNOSIS — M109 Gout, unspecified: Secondary | ICD-10-CM

## 2023-01-24 DIAGNOSIS — E049 Nontoxic goiter, unspecified: Secondary | ICD-10-CM | POA: Diagnosis not present

## 2023-01-24 DIAGNOSIS — D509 Iron deficiency anemia, unspecified: Secondary | ICD-10-CM | POA: Diagnosis not present

## 2023-01-24 DIAGNOSIS — Z8673 Personal history of transient ischemic attack (TIA), and cerebral infarction without residual deficits: Secondary | ICD-10-CM

## 2023-01-24 DIAGNOSIS — T466X5A Adverse effect of antihyperlipidemic and antiarteriosclerotic drugs, initial encounter: Secondary | ICD-10-CM | POA: Diagnosis not present

## 2023-01-24 DIAGNOSIS — I10 Essential (primary) hypertension: Secondary | ICD-10-CM | POA: Diagnosis not present

## 2023-01-24 DIAGNOSIS — E8881 Metabolic syndrome: Secondary | ICD-10-CM | POA: Diagnosis not present

## 2023-01-24 DIAGNOSIS — R399 Unspecified symptoms and signs involving the genitourinary system: Secondary | ICD-10-CM

## 2023-01-24 DIAGNOSIS — E039 Hypothyroidism, unspecified: Secondary | ICD-10-CM

## 2023-01-24 MED ORDER — TAMSULOSIN HCL 0.4 MG PO CAPS: 0.4000 mg | ORAL_CAPSULE | Freq: Every day | ORAL | 1 refills | Status: DC

## 2023-01-24 MED ORDER — CLONIDINE HCL 0.1 MG PO TABS: 0.1000 mg | ORAL_TABLET | Freq: Three times a day (TID) | ORAL | 0 refills | Status: DC

## 2023-01-25 LAB — COMPLETE METABOLIC PANEL WITH GFR
AG Ratio: 1.1 (calc) (ref 1.0–2.5)
ALT: 9 U/L (ref 9–46)
AST: 17 U/L (ref 10–35)
Albumin: 3.9 g/dL (ref 3.6–5.1)
Alkaline phosphatase (APISO): 55 U/L (ref 35–144)
BUN: 13 mg/dL (ref 7–25)
CO2: 23 mmol/L (ref 20–32)
Calcium: 9.2 mg/dL (ref 8.6–10.3)
Chloride: 106 mmol/L (ref 98–110)
Creat: 1.11 mg/dL (ref 0.70–1.22)
Globulin: 3.5 g/dL (calc) (ref 1.9–3.7)
Glucose, Bld: 105 mg/dL — ABNORMAL HIGH (ref 65–99)
Potassium: 4.1 mmol/L (ref 3.5–5.3)
Sodium: 139 mmol/L (ref 135–146)
Total Bilirubin: 0.4 mg/dL (ref 0.2–1.2)
Total Protein: 7.4 g/dL (ref 6.1–8.1)
eGFR: 67 mL/min/{1.73_m2} (ref >=60)

## 2023-01-25 LAB — CBC WITH DIFFERENTIAL/PLATELET
Absolute Monocytes: 448 cells/uL (ref 200–950)
Basophils Absolute: 42 cells/uL (ref 0–200)
Basophils Relative: 1.3 %
Eosinophils Absolute: 93 cells/uL (ref 15–500)
Eosinophils Relative: 2.9 %
HCT: 47.1 % (ref 38.5–50.0)
Hemoglobin: 14.9 g/dL (ref 13.2–17.1)
Lymphs Abs: 1078 cells/uL (ref 850–3900)
MCH: 25.7 pg — ABNORMAL LOW (ref 27.0–33.0)
MCHC: 31.6 g/dL — ABNORMAL LOW (ref 32.0–36.0)
MCV: 81.3 fL (ref 80.0–100.0)
MPV: 10.9 fL (ref 7.5–12.5)
Monocytes Relative: 14 %
Neutro Abs: 1539 cells/uL (ref 1500–7800)
Neutrophils Relative %: 48.1 %
Platelets: 268 10*3/uL (ref 140–400)
RBC: 5.79 10*6/uL (ref 4.20–5.80)
RDW: 14.4 % (ref 11.0–15.0)
Total Lymphocyte: 33.7 %
WBC: 3.2 10*3/uL — ABNORMAL LOW (ref 3.8–10.8)

## 2023-01-25 LAB — IRON,TIBC AND FERRITIN PANEL
%SAT: 21 % (calc) (ref 20–48)
Ferritin: 16 ng/mL — ABNORMAL LOW (ref 24–380)
Iron: 75 ug/dL (ref 50–180)
TIBC: 349 mcg/dL (calc) (ref 250–425)

## 2023-01-25 LAB — TSH: TSH: 2.47 mIU/L (ref 0.40–4.50)

## 2023-02-07 ENCOUNTER — Ambulatory Visit: Payer: Medicare Other

## 2023-02-23 NOTE — Progress Notes (Unsigned)
Name: Dennis Ford   MRN: [id number]    DOB: 05-23-1941   Date:02/26/2023       Progress Note  Subjective  Chief Complaint  Follow Up  HPI  HTN: he used to take norvasc but stopped years ago because developed angioedema, he was also given carvedilol and hydralazine but he stopped it on his own. Bystolic caused a rash He is currently only taking one clonidine twice daily ( has not been taking TID ) and half benazepril now he is up to half twice daily.  BP at home seems to higher in the mornings. BP is a little better and he is willing to increase clonidine to TID and will return in one month for follow up. We checked labs for secondary hypertension in Feb 2024 and it was negative He was given clonidine 0.1 mg but states did not take any of his  medications this morning before his visit, he will return int he afternoon to recheck next week with CMA He states not side effects. Explained he can take both pills fasting.   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   Abnormal TSH 06/04/2015    Past Surgical History:  Procedure Laterality Date   COLONOSCOPY     CYST EXCISION Left 2008   Upper Back on Left Shoulder   ESOPHAGOGASTRODUODENOSCOPY (EGD) WITH PROPOFOL N/A 11/02/2018   Procedure: ESOPHAGOGASTRODUODENOSCOPY (EGD) WITH PROPOFOL;  Surgeon: Midge Minium, MD;  Location: [facility];  Service: Endoscopy;  Laterality: N/A;   HERNIA REPAIR     LI[facility]    INGUINAL HERNIA REPAIR  11/27/2011   Procedure: HERNIA REPAIR INGUINAL ADULT;  Surgeon: Jetty Duhamel, MD;  Location: [facility];  Service: General;  Laterality: Right;   right inguinal hernia repair with mesh    Family History  Problem Relation Age of Onset   Breast cancer Mother     Social History   Tobacco Use   Smoking status: Former    Types: Cigars    Quit date: 11/22/1988    Years since quitting: 34.2   Smokeless tobacco: Never  Substance Use Topics   Alcohol use: No    Comment: he quit 10/2016     Current Outpatient Medications:    benazepril (LOTENSIN) 20 MG tablet, TAKE 0.5 TABS BY MOUTH IN THE MORNING, NOON, AND AT BEDTIME., Disp: 135 tablet, Rfl: 0   busPIRone (BUSPAR) 5 MG tablet, TAKE 1 TABLET BY MOUTH 2 TIMES DAILY AS NEEDED., Disp: 180 tablet, Rfl: 1   cloNIDine (CATAPRES) 0.1 MG tablet, Take 1 tablet (0.1 mg total) by mouth 3 (three) times daily., Disp: 270 tablet, Rfl: 0   colchicine 0.6 MG tablet, Take 1 tablet (0.6 mg total) by mouth daily as needed. May take two pills two hours apart if needed for gout attack, Disp: 30 tablet, Rfl: 0   levothyroxine (SYNTHROID) 75 MCG tablet, TAKE 1 TABLET (75 MCG TOTAL) BY MOUTH DAILY BEFORE BREAKFAST. TAKE 1 & 1/2 TABLET ON SUNDAYS, Disp: 90 tablet, Rfl: 3   tamsulosin (FLOMAX) 0.4 MG CAPS capsule, Take 1 capsule (0.4 mg total) by mouth daily., Disp: 90 capsule, Rfl: 1   triamcinolone  cream (KENALOG) 0.1 %, Apply 1 application topically 2 (two) times daily., Disp: 30 g, Rfl: 0  Allergies  Allergen Reactions   Aspirin     GI bleed    Bystolic [Nebivolol Hcl]     Rash after taking it for months    Norvasc [Amlodipine] Other (See Comments)    angioedema   Statins     Per patient GI bleed and upset stomach     I personally reviewed active problem list, medication list, allergies, family history, social history, health maintenance with the patient/caregiver today.   ROS  Ten systems reviewed and is negative except as mentioned in HPI    Objective  Vitals:   02/26/23 1117 02/26/23 1139  BP: (!) 188/110 (!) 192/96  Pulse: 90   Resp: 14   Temp: 97.6 F (36.4 C)   TempSrc: Oral    SpO2: 100%   Weight: 181 lb 12.8 oz (82.5 kg)   Height: 5\' 8"  (1.727 m)     Body mass index is 27.64 kg/m.  Physical Exam  Constitutional: Patient appears well-developed and well-nourished.  No distress.  HEENT: head atraumatic, normocephalic, pupils equal and reactive to light, neck supple Cardiovascular: Normal rate, regular rhythm and normal heart sounds.  No murmur heard. No BLE edema. Pulmonary/Chest: Effort normal and breath sounds normal. No respiratory distress. Abdominal: Soft.  There is no tenderness. Psychiatric: Patient has a normal mood and affect. behavior is normal. Judgment and thought content normal.    PHQ2/9:    02/26/2023   11:16 AM 01/24/2023   11:35 AM 12/21/2022   11:30 AM 12/12/2022   11:47 AM 10/02/2022   11:51 AM  Depression screen PHQ 2/9  Decreased Interest 1 0 1 1 0  Down, Depressed, Hopeless 1 0 0 0 0  PHQ - 2 Score 2 0 1 1 0  Altered sleeping 0 0 0 0 0  Tired, decreased energy 0 0 0 0 0  Change in appetite 0 0 0 0 0  Feeling bad or failure about yourself  0 0 0 0 0  Trouble concentrating 1 0 0 0 0  Moving slowly or fidgety/restless 0 0 0 0 0  Suicidal thoughts 0 0 0 0 0  PHQ-9 Score 3 0 1 1 0  Difficult doing work/chores Not difficult at all  Not difficult at all Not difficult at all     phq 9 is positive   Fall Risk:    02/26/2023   11:16 AM 01/24/2023   11:34 AM 12/21/2022   11:27 AM 12/12/2022   11:47 AM 10/02/2022   11:51 AM  Fall Risk   Falls in the past year? 0 0 0 0 0  Number falls in past yr:  0 0    Injury with Fall?  0 0    Risk for fall due to : No Fall Risks No Fall Risks No Fall Risks No Fall Risks No Fall Risks  Follow up Falls prevention discussed Falls prevention discussed Education provided;Falls prevention discussed Falls prevention discussed Falls prevention discussed;Education provided;Falls evaluation completed      Functional Status Survey: Is the patient deaf or have difficulty hearing?: No Does the patient  have difficulty seeing, even when wearing glasses/contacts?: No Does the patient have difficulty concentrating, remembering, or making decisions?: No Does the patient have difficulty walking or climbing stairs?: No Does the patient have difficulty dressing or bathing?: No Does the patient have difficulty doing errands alone such as visiting a doctor's office or  shopping?: No    Assessment & Plan  1. Uncontrolled hypertension  Discussed importance of taking medications daily and may take on an empty stomach, when he first wakes up, return next week after taking medications for recheck He states he feels great

## 2023-02-26 ENCOUNTER — Encounter: Payer: Self-pay | Admitting: Family Medicine

## 2023-02-26 ENCOUNTER — Ambulatory Visit (INDEPENDENT_AMBULATORY_CARE_PROVIDER_SITE_OTHER): Payer: Medicare Other | Admitting: Family Medicine

## 2023-02-26 VITALS — BP 192/96 | HR 90 | Temp 97.6°F | Resp 14 | Ht 68.0 in | Wt 181.8 lb

## 2023-02-26 DIAGNOSIS — I10 Essential (primary) hypertension: Secondary | ICD-10-CM

## 2023-03-05 ENCOUNTER — Ambulatory Visit: Payer: Medicare Other

## 2023-03-05 VITALS — BP 182/94

## 2023-03-05 DIAGNOSIS — I10 Essential (primary) hypertension: Secondary | ICD-10-CM

## 2023-03-05 NOTE — Progress Notes (Signed)
Patient here for Blood pressure check.  Currently on benazepril 0.5 bid and at last visit we increased clonidine to TID.  Patient denies side effects and blood pressure today is 182/94.  Recheck and was 174/100.  Dr. Carlynn Purl consulted with patient and patient did not take clonidine before he came so she directed him to take clonidine 0.1 qid.  Instructions given to patient.

## 2023-03-05 NOTE — Patient Instructions (Signed)
Take clonidine 0.1mg  four times daily.  1 with breakfast, 1 at lunch, 1 with dinner and 1 at bedtime.

## 2023-03-06 ENCOUNTER — Telehealth: Payer: Self-pay | Admitting: Family Medicine

## 2023-03-06 ENCOUNTER — Other Ambulatory Visit: Payer: Self-pay | Admitting: Family Medicine

## 2023-03-06 MED ORDER — ALLOPURINOL 100 MG PO TABS: 100.0000 mg | ORAL_TABLET | Freq: Every day | ORAL | 1 refills | Status: DC

## 2023-03-06 MED ORDER — PREDNISONE 10 MG PO TABS: 10.0000 mg | ORAL_TABLET | Freq: Two times a day (BID) | ORAL | 0 refills | Status: DC

## 2023-03-06 NOTE — Telephone Encounter (Signed)
Patient notifed

## 2023-03-06 NOTE — Telephone Encounter (Signed)
Copied from CRM [id number]. Topic: General - Other >> Mar 06, 2023  9:53 AM Everette C wrote: Reason for CRM: Medication Refill - Medication: allopurinol (ZYLOPRIM) 100 MG tablet  Has the patient contacted their pharmacy? No.   (Agent: If no, request that the patient contact the pharmacy for the refill. If patient does not wish to contact the pharmacy document the reason why and proceed with request.) (Agent: If yes, when and what did the pharmacy advise?)  Preferred Pharmacy (with phone number or street name): [facility] [phone] [facility], [place] - [address] [place] [address] Phone: [phone] Fax: [phone] Hours: Not open 24 hours   Has the patient been seen for an appointment in the last year OR does the patient have an upcoming appointment? Yes.    Agent: Please be advised that RX refills may take up to 3 business days. We ask that you follow-up with your pharmacy.

## 2023-03-06 NOTE — Telephone Encounter (Signed)
Allopurinol (ZYLOPRIM) 100 MG tablet is not on current list. Routing for review.

## 2023-04-28 ENCOUNTER — Other Ambulatory Visit: Payer: Self-pay | Admitting: Family Medicine

## 2023-04-28 DIAGNOSIS — E039 Hypothyroidism, unspecified: Secondary | ICD-10-CM

## 2023-05-01 ENCOUNTER — Ambulatory Visit: Payer: Medicare Other | Admitting: Family Medicine

## 2023-05-28 ENCOUNTER — Other Ambulatory Visit: Payer: Self-pay | Admitting: Family Medicine

## 2023-05-28 DIAGNOSIS — E039 Hypothyroidism, unspecified: Secondary | ICD-10-CM

## 2023-07-03 NOTE — Progress Notes (Unsigned)
Name: Dennis Ford   MRN: [id number]    DOB: June 21, 1941   Date:07/04/2023       Progress Note  Subjective  Chief Complaint  Follow up  HPI  HTN: he used to take norvasc but stopped years ago because developed angioedema, he was also given carvedilol and hydralazine but he stopped it on his own. Bystolic caused a rash He is currently only taking one clonidine twice daily ( has not been taking TID ) and half benazepril now he is up to half twice daily. We checked labs for secondary hypertension in Feb 2024 and it was negative He is still taking clonidine only twice a day or he gets too sleepy.  He has also been given benazepril to half TID and he is wiling to try that this time around , we discussed changing to clonidine patch but it was too costly   He has tried norvasc, bystolic, carvedilol, hydralazine, labetalol and maxzide  History of iron deficiency anemia: he denies fatigue, FIT test negative  Hypothyroidism: we will recheck TSH  Chronic back pain : he has a long history of lower back pain, history of back surgery, states pain is worse at night , affects his sleep, sometimes has radiculitis down both legs, no bowel or bladder incontinence. We will try gabapentin at night   Anxiety: he used to take a controlled medication years ago, we switched to buspirone back in Feb but he is not sure he is taking it, but willing to resume medication   Gout: no recent episodes, taking allopurinol prn, explained it is only to use for prevention, take colchicine prn instead   LUTS: going on for a while, initially noticed improvement with flomax He continues to have urinary frequency, urgency and nocturia, discussed referral to Urologist due to elevation of PSA but he is not ready to go    Metabolic syndrome: we will recheck labs   History of TIA: he has tried statin therapy but it cause myopathy   Contact dermatitis: he needs a refill of triamcinolone   Patient Active Problem List   Diagnosis  Date Noted   Anxiety 01/03/2023   Iron deficiency anemia 07/07/2019   History of TIAs 12/10/2018   HLD (hyperlipidemia) 11/02/2018   Intermittent low back pain 12/20/2016   Hyperglycemia 06/01/2016   Hypertension, benign 05/31/2016   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   Abnormal TSH 06/04/2015    Past Surgical History:  Procedure Laterality Date   COLONOSCOPY     CYST EXCISION Left 2008   Upper Back on Left Shoulder   ESOPHAGOGASTRODUODENOSCOPY (EGD) WITH PROPOFOL N/A 11/02/2018   Procedure: ESOPHAGOGASTRODUODENOSCOPY (EGD) WITH PROPOFOL;  Surgeon: Midge Minium, MD;  Location: [facility];  Service: Endoscopy;  Laterality: N/A;   HERNIA REPAIR     LIH    INGUINAL HERNIA REPAIR  11/27/2011   Procedure: HERNIA REPAIR INGUINAL ADULT;  Surgeon: Jetty Duhamel, MD;  Location: [facility];  Service: General;  Laterality: Right;  right inguinal hernia repair with mesh    Family History  Problem Relation Age of Onset   Breast cancer Mother     Social History   Tobacco Use   Smoking status: Former    Types: Cigars    Quit date: 11/22/1988    Years since quitting: 34.6   Smokeless tobacco: Never  Substance Use Topics   Alcohol use: No    Comment: he quit 10/2016     Current Outpatient Medications:    allopurinol (ZYLOPRIM)  100 MG tablet, Take 1 tablet (100 mg total) by mouth daily., Disp: 90 tablet, Rfl: 1   benazepril (LOTENSIN) 20 MG tablet, TAKE 0.5 TABS BY MOUTH IN THE MORNING, NOON, AND AT BEDTIME., Disp: 135 tablet, Rfl: 0   busPIRone (BUSPAR) 5 MG tablet, TAKE 1 TABLET BY MOUTH 2 TIMES DAILY AS NEEDED., Disp: 180 tablet, Rfl: 1   cloNIDine (CATAPRES) 0.1 MG tablet, Take 1 tablet (0.1 mg total) by mouth 3 (three) times daily., Disp: 270 tablet, Rfl: 0   colchicine 0.6 MG tablet, Take 1 tablet (0.6 mg total) by mouth daily as needed. May take two pills two hours apart if needed for gout attack, Disp: 30 tablet, Rfl: 0   levothyroxine (SYNTHROID) 75 MCG tablet,  TAKE 1 TABLET (75 MCG TOTAL) BY MOUTH DAILY BEFORE BREAKFAST. TAKE 1 & 1/2 TABLET ON SUNDAYS, Disp: 90 tablet, Rfl: 0   predniSONE (DELTASONE) 10 MG tablet, Take 1 tablet (10 mg total) by mouth 2 (two) times daily with a meal., Disp: 6 tablet, Rfl: 0   tamsulosin (FLOMAX) 0.4 MG CAPS capsule, Take 1 capsule (0.4 mg total) by mouth daily., Disp: 90 capsule, Rfl: 1   triamcinolone cream (KENALOG) 0.1 %, Apply 1 application topically 2 (two) times daily., Disp: 30 g, Rfl: 0  Allergies  Allergen Reactions   Aspirin     GI bleed    Bystolic [Nebivolol Hcl]     Rash after taking it for months    Norvasc [Amlodipine] Other (See Comments)    angioedema   Statins     Per patient GI bleed and upset stomach     I personally reviewed active problem list, medication list, allergies, family history, social history with the patient/caregiver today.   ROS  Constitutional: Negative for fever or weight change.  Respiratory: Negative for cough and shortness of breath.   Cardiovascular: Negative for chest pain or palpitations.  Gastrointestinal: Negative for abdominal pain, no bowel changes.  Musculoskeletal: Negative for gait problem or joint swelling.  Skin: Negative for rash.  Neurological: Negative for dizziness or headache.  No other specific complaints in a complete review of systems (except as listed in HPI above).   Objective  Vitals:   07/04/23 1433 07/04/23 1447 07/04/23 1448  BP: (!) 220/84 (!) 190/98 (!) 188/90  Pulse: 93    Resp: 14    Temp: 97.8 F (36.6 C)    TempSrc: Oral    SpO2: 98%    Weight: 177 lb 9.6 oz (80.6 kg)    Height: 5\' 7"  (1.702 m)      Body mass index is 27.82 kg/m.  Physical Exam  Constitutional: Patient appears well-developed and well-nourished.  No distress.  HEENT: head atraumatic, normocephalic, pupils equal and reactive to light, neck supple Cardiovascular: Normal rate, regular rhythm and normal heart sounds.  No murmur heard. No BLE  edema. Pulmonary/Chest: Effort normal and breath sounds normal. No respiratory distress. Abdominal: Soft.  There is no tenderness. Psychiatric: Patient has a normal mood and affect. behavior is normal. Judgment and thought content normal.   PHQ2/9:    07/04/2023    2:30 PM 02/26/2023   11:16 AM 01/24/2023   11:35 AM 12/21/2022   11:30 AM 12/12/2022   11:47 AM  Depression screen PHQ 2/9  Decreased Interest 1 1 0 1 1  Down, Depressed, Hopeless 0 1 0 0 0  PHQ - 2 Score 1 2 0 1 1  Altered sleeping 0 0 0 0 0  Tired,  decreased energy 3 0 0 0 0  Change in appetite 0 0 0 0 0  Feeling bad or failure about yourself  0 0 0 0 0  Trouble concentrating 0 1 0 0 0  Moving slowly or fidgety/restless 0 0 0 0 0  Suicidal thoughts 0 0 0 0 0  PHQ-9 Score 4 3 0 1 1  Difficult doing work/chores Not difficult at all Not difficult at all  Not difficult at all Not difficult at all    phq 9 is negative   Fall Risk:    07/04/2023    2:30 PM 02/26/2023   11:16 AM 01/24/2023   11:34 AM 12/21/2022   11:27 AM 12/12/2022   11:47 AM  Fall Risk   Falls in the past year? 0 0 0 0 0  Number falls in past yr:   0 0   Injury with Fall?   0 0   Risk for fall due to : No Fall Risks No Fall Risks No Fall Risks No Fall Risks No Fall Risks  Follow up Falls prevention discussed Falls prevention discussed Falls prevention discussed Education provided;Falls prevention discussed Falls prevention discussed      Functional Status Survey: Is the patient deaf or have difficulty hearing?: No Does the patient have difficulty seeing, even when wearing glasses/contacts?: No Does the patient have difficulty concentrating, remembering, or making decisions?: No Does the patient have difficulty walking or climbing stairs?: No Does the patient have difficulty dressing or bathing?: No Does the patient have difficulty doing errands alone such as visiting a doctor's office or shopping?: No    Assessment & Plan  1. Uncontrolled  hypertension  - CBC with Differential/Platelet - COMPLETE METABOLIC PANEL WITH GFR - benazepril (LOTENSIN) 20 MG tablet; Take 0.5 tablets (10 mg total) by mouth in the morning, at noon, and at bedtime.  Dispense: 135 tablet; Refill: 0 - cloNIDine (CATAPRES) 0.1 MG tablet; Take 1 tablet (0.1 mg total) by mouth 3 (three) times daily.  Dispense: 270 tablet; Refill: 0  2. Acquired hypothyroidism  TSH  3. Statin myopathy  Unchanged   4. Controlled gout  - Uric acid - colchicine 0.6 MG tablet; Take 1 tablet (0.6 mg total) by mouth daily as needed. May take two pills two hours apart if needed for gout attack  Dispense: 30 tablet; Refill: 0  5. Other neutropenia (HCC)  Recheck CBC   6. Lower urinary tract symptoms (LUTS)  - PSA - tamsulosin (FLOMAX) 0.4 MG CAPS capsule; Take 1 capsule (0.4 mg total) by mouth daily.  Dispense: 90 capsule; Refill: 3  7. Other iron deficiency anemia  - Iron, TIBC and Ferritin Panel  8. History of TIA (transient ischemic attack)  Unchanged  9. Elevated PSA  - PSA  10. Hypercholesteremia  - Lipid panel  11. Allergic contact dermatitis due to other agents  - triamcinolone cream (KENALOG) 0.1 %; Apply 1 Application topically 2 (two) times daily.  Dispense: 45 g; Refill: 0

## 2023-07-04 ENCOUNTER — Encounter: Payer: Self-pay | Admitting: Family Medicine

## 2023-07-04 ENCOUNTER — Ambulatory Visit (INDEPENDENT_AMBULATORY_CARE_PROVIDER_SITE_OTHER): Payer: Medicare Other | Admitting: Family Medicine

## 2023-07-04 VITALS — BP 188/90 | HR 93 | Temp 97.8°F | Resp 14 | Ht 67.0 in | Wt 177.6 lb

## 2023-07-04 DIAGNOSIS — G720 Drug-induced myopathy: Secondary | ICD-10-CM

## 2023-07-04 DIAGNOSIS — D508 Other iron deficiency anemias: Secondary | ICD-10-CM

## 2023-07-04 DIAGNOSIS — E7800 Pure hypercholesterolemia, unspecified: Secondary | ICD-10-CM | POA: Diagnosis not present

## 2023-07-04 DIAGNOSIS — D708 Other neutropenia: Secondary | ICD-10-CM | POA: Diagnosis not present

## 2023-07-04 DIAGNOSIS — I10 Essential (primary) hypertension: Secondary | ICD-10-CM

## 2023-07-04 DIAGNOSIS — L2389 Allergic contact dermatitis due to other agents: Secondary | ICD-10-CM | POA: Diagnosis not present

## 2023-07-04 DIAGNOSIS — M109 Gout, unspecified: Secondary | ICD-10-CM | POA: Diagnosis not present

## 2023-07-04 DIAGNOSIS — E039 Hypothyroidism, unspecified: Secondary | ICD-10-CM | POA: Diagnosis not present

## 2023-07-04 DIAGNOSIS — R972 Elevated prostate specific antigen [PSA]: Secondary | ICD-10-CM

## 2023-07-04 DIAGNOSIS — F419 Anxiety disorder, unspecified: Secondary | ICD-10-CM

## 2023-07-04 DIAGNOSIS — G8929 Other chronic pain: Secondary | ICD-10-CM

## 2023-07-04 DIAGNOSIS — Z8673 Personal history of transient ischemic attack (TIA), and cerebral infarction without residual deficits: Secondary | ICD-10-CM

## 2023-07-04 DIAGNOSIS — M5440 Lumbago with sciatica, unspecified side: Secondary | ICD-10-CM | POA: Diagnosis not present

## 2023-07-04 DIAGNOSIS — T466X5A Adverse effect of antihyperlipidemic and antiarteriosclerotic drugs, initial encounter: Secondary | ICD-10-CM

## 2023-07-04 DIAGNOSIS — R399 Unspecified symptoms and signs involving the genitourinary system: Secondary | ICD-10-CM

## 2023-07-04 MED ORDER — BUSPIRONE HCL 5 MG PO TABS: 5.0000 mg | ORAL_TABLET | Freq: Two times a day (BID) | ORAL | 1 refills | Status: AC | PRN

## 2023-07-04 MED ORDER — CLONIDINE HCL 0.1 MG PO TABS
0.1000 mg | ORAL_TABLET | Freq: Three times a day (TID) | ORAL | 0 refills | Status: DC
Start: 2023-07-04 — End: 2024-02-27

## 2023-07-04 MED ORDER — TRIAMCINOLONE ACETONIDE 0.1 % EX CREA
1.0000 | TOPICAL_CREAM | Freq: Two times a day (BID) | CUTANEOUS | 0 refills | Status: AC
Start: 2023-07-04 — End: ?

## 2023-07-04 MED ORDER — TAMSULOSIN HCL 0.4 MG PO CAPS: 0.4000 mg | ORAL_CAPSULE | Freq: Every day | ORAL | 3 refills | Status: DC

## 2023-07-04 MED ORDER — GABAPENTIN 100 MG PO CAPS
100.0000 mg | ORAL_CAPSULE | Freq: Every day | ORAL | 0 refills | Status: AC
Start: 2023-07-04 — End: ?

## 2023-07-04 MED ORDER — BENAZEPRIL HCL 20 MG PO TABS
10.0000 mg | ORAL_TABLET | Freq: Three times a day (TID) | ORAL | 0 refills | Status: DC
Start: 2023-07-04 — End: 2023-11-28

## 2023-07-04 MED ORDER — COLCHICINE 0.6 MG PO TABS
0.6000 mg | ORAL_TABLET | Freq: Every day | ORAL | 0 refills | Status: DC | PRN
Start: 2023-07-04 — End: 2023-07-12

## 2023-07-04 NOTE — Patient Instructions (Signed)
Benazepril 1/2 tablet 3 times a day--Clonidine try to take it 1 tablet 3 times a day

## 2023-07-05 LAB — PSA: PSA: 5.31 ng/mL — ABNORMAL HIGH (ref <=4.00)

## 2023-07-05 LAB — COMPLETE METABOLIC PANEL WITH GFR
AG Ratio: 1.3 (calc) (ref 1.0–2.5)
ALT: 7 U/L — ABNORMAL LOW (ref 9–46)
AST: 12 U/L (ref 10–35)
Albumin: 3.9 g/dL (ref 3.6–5.1)
Alkaline phosphatase (APISO): 49 U/L (ref 35–144)
BUN: 13 mg/dL (ref 7–25)
CO2: 26 mmol/L (ref 20–32)
Calcium: 9.5 mg/dL (ref 8.6–10.3)
Chloride: 104 mmol/L (ref 98–110)
Creat: 1.03 mg/dL (ref 0.70–1.22)
Globulin: 3.1 g/dL (ref 1.9–3.7)
Glucose, Bld: 88 mg/dL (ref 65–99)
Potassium: 4.9 mmol/L (ref 3.5–5.3)
Sodium: 137 mmol/L (ref 135–146)
Total Bilirubin: 0.3 mg/dL (ref 0.2–1.2)
Total Protein: 7 g/dL (ref 6.1–8.1)
eGFR: 73 mL/min/{1.73_m2} (ref >=60)

## 2023-07-05 LAB — IRON,TIBC AND FERRITIN PANEL
%SAT: 22 % (ref 20–48)
Ferritin: 20 ng/mL — ABNORMAL LOW (ref 24–380)
Iron: 67 ug/dL (ref 50–180)
TIBC: 303 mcg/dL (calc) (ref 250–425)

## 2023-07-05 LAB — CBC WITH DIFFERENTIAL/PLATELET
Absolute Monocytes: 406 cells/uL (ref 200–950)
Basophils Absolute: 40 {cells}/uL (ref 0–200)
Basophils Relative: 1.2 %
Eosinophils Absolute: 112 {cells}/uL (ref 15–500)
Eosinophils Relative: 3.4 %
HCT: 46.8 % (ref 38.5–50.0)
Hemoglobin: 14.4 g/dL (ref 13.2–17.1)
Lymphs Abs: 1317 {cells}/uL (ref 850–3900)
MCH: 25.6 pg — ABNORMAL LOW (ref 27.0–33.0)
MCHC: 30.8 g/dL — ABNORMAL LOW (ref 32.0–36.0)
MCV: 83.1 fL (ref 80.0–100.0)
MPV: 10.5 fL (ref 7.5–12.5)
Monocytes Relative: 12.3 %
Neutro Abs: 1426 {cells}/uL — ABNORMAL LOW (ref 1500–7800)
Neutrophils Relative %: 43.2 %
Platelets: 322 10*3/uL (ref 140–400)
RBC: 5.63 10*6/uL (ref 4.20–5.80)
RDW: 15.3 % — ABNORMAL HIGH (ref 11.0–15.0)
Total Lymphocyte: 39.9 %
WBC: 3.3 10*3/uL — ABNORMAL LOW (ref 3.8–10.8)

## 2023-07-05 LAB — LIPID PANEL
Cholesterol: 169 mg/dL (ref <200)
HDL: 53 mg/dL (ref >=40)
LDL Cholesterol (Calc): 90 mg/dL
Non-HDL Cholesterol (Calc): 116 mg/dL (ref <130)
Total CHOL/HDL Ratio: 3.2 (calc) (ref <5.0)
Triglycerides: 159 mg/dL — ABNORMAL HIGH (ref <150)

## 2023-07-05 LAB — TSH: TSH: 3.52 m[IU]/L (ref 0.40–4.50)

## 2023-07-05 LAB — URIC ACID: Uric Acid, Serum: 7.2 mg/dL (ref 4.0–8.0)

## 2023-07-11 ENCOUNTER — Other Ambulatory Visit: Payer: Self-pay | Admitting: Family Medicine

## 2023-07-11 DIAGNOSIS — Z8673 Personal history of transient ischemic attack (TIA), and cerebral infarction without residual deficits: Secondary | ICD-10-CM

## 2023-07-11 DIAGNOSIS — E7800 Pure hypercholesterolemia, unspecified: Secondary | ICD-10-CM

## 2023-07-11 DIAGNOSIS — G720 Drug-induced myopathy: Secondary | ICD-10-CM

## 2023-07-11 MED ORDER — EZETIMIBE 10 MG PO TABS: 10.0000 mg | ORAL_TABLET | Freq: Every day | ORAL | 3 refills | Status: DC

## 2023-07-12 ENCOUNTER — Other Ambulatory Visit: Payer: Self-pay | Admitting: Family Medicine

## 2023-07-12 DIAGNOSIS — M109 Gout, unspecified: Secondary | ICD-10-CM

## 2023-10-05 ENCOUNTER — Other Ambulatory Visit: Payer: Self-pay | Admitting: Family Medicine

## 2023-10-05 DIAGNOSIS — E039 Hypothyroidism, unspecified: Secondary | ICD-10-CM

## 2023-11-24 ENCOUNTER — Other Ambulatory Visit: Payer: Self-pay | Admitting: Family Medicine

## 2023-11-24 DIAGNOSIS — I10 Essential (primary) hypertension: Secondary | ICD-10-CM

## 2023-11-27 NOTE — Telephone Encounter (Signed)
Spoke with pt and he is coming in tomorrow 11-28-2023 for an appt for followup / medrefill per your request.

## 2023-11-28 ENCOUNTER — Encounter: Payer: Self-pay | Admitting: Family Medicine

## 2023-11-28 ENCOUNTER — Ambulatory Visit (INDEPENDENT_AMBULATORY_CARE_PROVIDER_SITE_OTHER): Payer: Medicare Other | Admitting: Family Medicine

## 2023-11-28 VITALS — BP 186/112 | HR 100 | Temp 97.6°F | Resp 18 | Ht 67.0 in | Wt 182.6 lb

## 2023-11-28 DIAGNOSIS — G720 Drug-induced myopathy: Secondary | ICD-10-CM

## 2023-11-28 DIAGNOSIS — E039 Hypothyroidism, unspecified: Secondary | ICD-10-CM

## 2023-11-28 DIAGNOSIS — E611 Iron deficiency: Secondary | ICD-10-CM | POA: Diagnosis not present

## 2023-11-28 DIAGNOSIS — I10 Essential (primary) hypertension: Secondary | ICD-10-CM

## 2023-11-28 DIAGNOSIS — Z8673 Personal history of transient ischemic attack (TIA), and cerebral infarction without residual deficits: Secondary | ICD-10-CM

## 2023-11-28 DIAGNOSIS — E785 Hyperlipidemia, unspecified: Secondary | ICD-10-CM | POA: Diagnosis not present

## 2023-11-28 DIAGNOSIS — R972 Elevated prostate specific antigen [PSA]: Secondary | ICD-10-CM

## 2023-11-28 DIAGNOSIS — H9313 Tinnitus, bilateral: Secondary | ICD-10-CM | POA: Diagnosis not present

## 2023-11-28 DIAGNOSIS — M109 Gout, unspecified: Secondary | ICD-10-CM | POA: Diagnosis not present

## 2023-11-28 DIAGNOSIS — E7800 Pure hypercholesterolemia, unspecified: Secondary | ICD-10-CM

## 2023-11-28 DIAGNOSIS — T466X5A Adverse effect of antihyperlipidemic and antiarteriosclerotic drugs, initial encounter: Secondary | ICD-10-CM

## 2023-11-28 DIAGNOSIS — R399 Unspecified symptoms and signs involving the genitourinary system: Secondary | ICD-10-CM

## 2023-11-28 DIAGNOSIS — J069 Acute upper respiratory infection, unspecified: Secondary | ICD-10-CM

## 2023-11-28 DIAGNOSIS — D708 Other neutropenia: Secondary | ICD-10-CM

## 2023-11-28 MED ORDER — TAMSULOSIN HCL 0.4 MG PO CAPS: 0.4000 mg | ORAL_CAPSULE | Freq: Every day | ORAL | 3 refills | Status: DC

## 2023-11-28 MED ORDER — LEVOTHYROXINE SODIUM 75 MCG PO TABS: 75.0000 ug | ORAL_TABLET | Freq: Every day | ORAL | 0 refills | Status: DC

## 2023-11-28 MED ORDER — BENAZEPRIL HCL 10 MG PO TABS: 10.0000 mg | ORAL_TABLET | Freq: Three times a day (TID) | ORAL | 1 refills | Status: DC

## 2023-11-28 NOTE — Patient Instructions (Signed)
Coricidin HBP, saline spray

## 2023-11-28 NOTE — Progress Notes (Signed)
Name: Dennis Ford   MRN: [id number]    DOB: Oct 10, 1941   Date:11/28/2023       Progress Note  Subjective  Chief Complaint  Chief Complaint  Patient presents with   Medical Management of Chronic Issues   Medication Refill   Hypertension   Hypothyroidism   URI    Onset 2days stuffy nose and head congestion   Discussed the use of AI scribe software for clinical note transcription with the patient, who gave verbal consent to proceed.  History of Present Illness   The patient, with a history of hypertension, mini stroke, hyperlipidemia, chronic gout, and elevated PSA, presents for a six-month follow-up. He has been struggling with blood pressure control, with frequent high readings. However, he reports that his blood pressure readings at home are often around 130-140 SBP over 70-80's,  suggesting possible white coat hypertension.  The patient is currently on benazepril, taking 10mg  three times a day. He also takes clonidine 0.1 mg three times a day. Despite this regimen, his blood pressure remains high. We have tried multiple different medications, but he stops due to side effects   He also has a history of hypothyroidism, currently managed with levothyroxine 75 mcg once a day. His TSH levels within normal limits August 2024. He denies any changes in bowel movements, constipation, dry skin, or problems swallowing.  For his chronic gout, he takes colchicine as needed, and his gout is currently under control. He is also on ezetimibe for his hyperlipidemia, which he takes once a day.  The patient has elevated PSA and symptoms of lower urinary tract obstruction. He is currently on Flomax (tamsulosin), which he reports as helpful. He has declined to see a urologist for further management.  He also reports a new symptom of bilateral ringing in his ears, which started a few days ago along with symptoms of a cold. He denies any hearing loss.  The patient has a history of back surgery and reports  ongoing back pain, which he manages with Tylenol as needed. He has been prescribed gabapentin for this but only takes it as needed. He also reports excessive sweating in his back.  For his anxiety, he has been prescribed buspirone, which he takes as needed. He reports that some of his medications do not seem to help and he does not take them regularly.       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   Abnormal TSH 06/04/2015    Past Surgical History:  Procedure Laterality Date   COLONOSCOPY     CYST EXCISION Left 2008   Upper Back on Left Shoulder   ESOPHAGOGASTRODUODENOSCOPY (EGD) WITH PROPOFOL N/A 11/02/2018   Procedure: ESOPHAGOGASTRODUODENOSCOPY (EGD) WITH PROPOFOL;  Surgeon: Midge Minium, MD;  Location: [facility];  Service: Endoscopy;  Laterality: N/A;   HERNIA REPAIR     LIH    INGUINAL HERNIA REPAIR  11/27/2011   Procedure: HERNIA REPAIR INGUINAL ADULT;  Surgeon: Jetty Duhamel, MD;  Location: [facility];  Service: General;  Laterality: Right;  right inguinal hernia repair with mesh    Family History  Problem Relation Age of Onset   Breast cancer Mother     Social History   Tobacco Use  Smoking status: Former    Types: Cigars    Quit date: 11/22/1988    Years since quitting: 35.0   Smokeless tobacco: Never  Substance Use Topics   Alcohol use: No    Comment: he quit 10/2016     Current Outpatient Medications:    benazepril (LOTENSIN) 20 MG tablet, Take 0.5 tablets (10 mg total) by mouth in the morning, at noon, and at bedtime., Disp: 135 tablet, Rfl: 0   busPIRone (BUSPAR) 5 MG tablet, Take 1 tablet (5 mg total) by mouth 2 (two) times daily as needed., Disp: 180 tablet, Rfl: 1    cloNIDine (CATAPRES) 0.1 MG tablet, Take 1 tablet (0.1 mg total) by mouth 3 (three) times daily., Disp: 270 tablet, Rfl: 0   colchicine 0.6 MG tablet, TAKE 1 TABLET BY MOUTH DAILY AS NEEDED. MAY TAKE TWO PILLS TWO HOURS APART IF NEEDED FOR GOUT ATTACK, Disp: 30 tablet, Rfl: 1   ezetimibe (ZETIA) 10 MG tablet, Take 1 tablet (10 mg total) by mouth daily., Disp: 90 tablet, Rfl: 3   gabapentin (NEURONTIN) 100 MG capsule, Take 1-3 capsules (100-300 mg total) by mouth at bedtime., Disp: 90 capsule, Rfl: 0   levothyroxine (SYNTHROID) 75 MCG tablet, TAKE 1 TABLET (75 MCG TOTAL) BY MOUTH DAILY BEFORE BREAKFAST. TAKE 1 & 1/2 TABLET ON SUNDAYS, Disp: 90 tablet, Rfl: 0   tamsulosin (FLOMAX) 0.4 MG CAPS capsule, Take 1 capsule (0.4 mg total) by mouth daily., Disp: 90 capsule, Rfl: 3   triamcinolone cream (KENALOG) 0.1 %, Apply 1 Application topically 2 (two) times daily., Disp: 45 g, Rfl: 0  Allergies  Allergen Reactions   Aspirin     GI bleed    Bystolic [Nebivolol Hcl]     Rash after taking it for months    Norvasc [Amlodipine] Other (See Comments)    angioedema   Statins     Per patient GI bleed and upset stomach     I personally reviewed active problem list, medication list, allergies, family history with the patient/caregiver today.   ROS  Ten systems reviewed and is negative except as mentioned in HPI    Objective  Vitals:   11/28/23 1250  BP: (!) 188/120  Pulse: 100  Resp: 18  Temp: 97.6 F (36.4 C)  SpO2: 98%  Weight: 182 lb 9.6 oz (82.8 kg)  Height: 5\' 7"  (1.702 m)    Body mass index is 28.6 kg/m.  Physical Exam  Constitutional: Patient appears well-developed and well-nourished. Obese  No distress.  HEENT: head atraumatic, normocephalic, pupils equal and reactive to light,, neck supple Cardiovascular: Normal rate, regular rhythm and normal heart sounds.  No murmur heard. No BLE edema. Pulmonary/Chest: Effort normal and breath sounds normal. No respiratory  distress. Neuro: no focal deficit  Abdominal: Soft.  There is no tenderness. Psychiatric: Patient has a normal mood and affect. behavior is normal. Judgment and thought content normal.   Diabetic Foot Exam:     PHQ2/9:    07/04/2023    2:30 PM 02/26/2023   11:16 AM 01/24/2023   11:35 AM 12/21/2022   11:30 AM 12/12/2022   11:47 AM  Depression screen PHQ 2/9  Decreased Interest 1 1 0 1 1  Down, Depressed, Hopeless 0 1 0 0 0  PHQ - 2 Score 1 2 0 1 1  Altered sleeping 0 0 0 0 0  Tired, decreased energy 3 0 0 0 0  Change in appetite 0 0 0 0 0  Feeling bad or  failure about yourself  0 0 0 0 0  Trouble concentrating 0 1 0 0 0  Moving slowly or fidgety/restless 0 0 0 0 0  Suicidal thoughts 0 0 0 0 0  PHQ-9 Score 4 3 0 1 1  Difficult doing work/chores Not difficult at all Not difficult at all  Not difficult at all Not difficult at all    phq 9 is positive  Fall Risk:    11/28/2023   12:57 PM 07/04/2023    2:30 PM 02/26/2023   11:16 AM 01/24/2023   11:34 AM 12/21/2022   11:27 AM  Fall Risk   Falls in the past year? 0 0 0 0 0  Number falls in past yr: 0   0 0  Injury with Fall? 0   0 0  Risk for fall due to :  No Fall Risks No Fall Risks No Fall Risks No Fall Risks  Follow up Falls evaluation completed Falls prevention discussed Falls prevention discussed Falls prevention discussed Education provided;Falls prevention discussed     Assessment and Plan    Hypertension Poorly controlled with a history of non-compliance and possible white coat hypertension. Currently on Benazepril 10mg  TID and Clonidine 0.1mg  TID. -Change Benazepril prescription to 10mg  to avoid pill splitting. -Consider Buspar 30 minutes prior to office visits to manage potential white coat hypertension. -Check blood pressure at home regularly and record readings. -Follow-up in 3 months. -return in 2 weeks for BP check only with CMA  Hyperlipidemia History of stroke and intolerance to statins. Currently on  Ezetimibe. -Continue Ezetimibe daily.  Chronic Gout Controlled with Colchicine as needed. -Continue Colchicine as needed.  Lower Urinary Tract Symptoms (LUTS) Symptoms managed with Tamsulosin (Flomax). -Continue Tamsulosin.  Hypothyroidism Controlled with Levothyroxine daily. -Continue Levothyroxine daily, with an additional half pill on Sundays.  Anxiety Managed with Buspirone as needed. -Continue Buspirone as needed.  Back Pain Post-surgical, managed with Tylenol and Gabapentin as needed. -Continue Tylenol and Gabapentin as needed.  Upper Respiratory Symptoms New onset of cold symptoms with bilateral tinnitus. -Recommend over-the-counter treatments including Saline spray, Flonase, and Coricidin HBP. -If symptoms persist or worsen, seek medical attention.

## 2023-12-12 ENCOUNTER — Ambulatory Visit: Payer: Medicare Other

## 2023-12-12 VITALS — BP 206/104

## 2023-12-12 DIAGNOSIS — I10 Essential (primary) hypertension: Secondary | ICD-10-CM

## 2023-12-12 NOTE — Progress Notes (Signed)
 Patient is in office today for a nurse visit for Blood Pressure Check. Patient blood pressure was 208/104 and re checked it a few minutes after Clotilda, CMA got 206/104 , Patient No chest pain, No shortness of breath, No dyspnea on exertion, No orthopnea, No paroxysmal nocturnal dyspnea, No edema, No palpitations, No syncope. Pt has not taken the morning dose yet due to working 3rd shift.

## 2023-12-27 ENCOUNTER — Ambulatory Visit: Payer: Medicare Other

## 2023-12-27 DIAGNOSIS — Z0000 Encounter for general adult medical examination without abnormal findings: Secondary | ICD-10-CM

## 2023-12-27 NOTE — Progress Notes (Addendum)
 Subjective:   Dennis Ford is a 83 y.o. who presents for a Medicare Wellness preventive visit.  Visit Complete: Virtual I connected with  Dennis Ford on 12/27/23 by a audio enabled telemedicine application and verified that I am speaking with the correct person using two identifiers.  Patient Location: Home  Provider Location: Office/Clinic  I discussed the limitations of evaluation and management by telemedicine. The patient expressed understanding and agreed to proceed.  Vital Signs: Because this visit was a virtual/telehealth visit, some criteria may be missing or patient reported. Any vitals not documented were not able to be obtained and vitals that have been documented are patient reported.  VideoDeclined- This patient declined Librarian, academic. Therefore the visit was completed with audio only.  AWV Questionnaire: No: Patient Medicare AWV questionnaire was not completed prior to this visit.  Cardiac Risk Factors include: advanced age (>56men, >74 women);dyslipidemia;male gender;hypertension;sedentary lifestyle     Objective:    There were no vitals filed for this visit. There is no height or weight on file to calculate BMI.     12/27/2023   10:26 AM 12/21/2022   11:36 AM 12/13/2021    2:59 PM 12/07/2020    9:33 AM 08/07/2019    8:22 AM 11/02/2018    4:35 AM 11/01/2018    8:52 PM  Advanced Directives  Does Patient Have a Medical Advance Directive? No No No No No No No  Would patient like information on creating a medical advance directive? No - Patient declined No - Patient declined Yes (MAU/Ambulatory/Procedural Areas - Information given) No - Patient declined Yes (MAU/Ambulatory/Procedural Areas - Information given) No - Patient declined     Current Medications (verified) Outpatient Encounter Medications as of 12/27/2023  Medication Sig   benazepril (LOTENSIN) 10 MG tablet Take 1 tablet (10 mg total) by mouth in the morning, at noon, and  at bedtime.   busPIRone (BUSPAR) 5 MG tablet Take 1 tablet (5 mg total) by mouth 2 (two) times daily as needed.   cloNIDine (CATAPRES) 0.1 MG tablet Take 1 tablet (0.1 mg total) by mouth 3 (three) times daily.   colchicine 0.6 MG tablet TAKE 1 TABLET BY MOUTH DAILY AS NEEDED. MAY TAKE TWO PILLS TWO HOURS APART IF NEEDED FOR GOUT ATTACK   levothyroxine (SYNTHROID) 75 MCG tablet Take 1 tablet (75 mcg total) by mouth daily before breakfast. One a half on Sundays   tamsulosin (FLOMAX) 0.4 MG CAPS capsule Take 1 capsule (0.4 mg total) by mouth daily.   triamcinolone cream (KENALOG) 0.1 % Apply 1 Application topically 2 (two) times daily.   ezetimibe (ZETIA) 10 MG tablet Take 1 tablet (10 mg total) by mouth daily. (Patient not taking: Reported on 12/27/2023)   gabapentin (NEURONTIN) 100 MG capsule Take 1-3 capsules (100-300 mg total) by mouth at bedtime. (Patient not taking: Reported on 12/27/2023)   No facility-administered encounter medications on file as of 12/27/2023.    Allergies (verified) Aspirin, Bystolic [nebivolol hcl], Norvasc [amlodipine], and Statins   History: Past Medical History:  Diagnosis Date   Diabetes mellitus without complication (HCC)    Goiter    History of inguinal hernia repair    Hyperlipidemia    Hypertension    Past Surgical History:  Procedure Laterality Date   COLONOSCOPY     CYST EXCISION Left 2008   Upper Back on Left Shoulder   ESOPHAGOGASTRODUODENOSCOPY (EGD) WITH PROPOFOL N/A 11/02/2018   Procedure: ESOPHAGOGASTRODUODENOSCOPY (EGD) WITH PROPOFOL;  Surgeon: Midge Minium, MD;  Location: [facility];  Service: Endoscopy;  Laterality: N/A;   HERNIA REPAIR     LIH    INGUINAL HERNIA REPAIR  11/27/2011   Procedure: HERNIA REPAIR INGUINAL ADULT;  Surgeon: Jetty Duhamel, MD;  Location: [facility];  Service: General;  Laterality: Right;  right inguinal hernia repair with mesh   Family History  Problem Relation Age of Onset   Breast cancer  Mother    Social History   Socioeconomic History   Marital status: Married    Spouse name: Not on file   Number of children: 2   Years of education: Not on file   Highest education level: 10th grade  Occupational History   Occupation: retired  Tobacco Use   Smoking status: Former    Types: Cigars    Quit date: 11/22/1988    Years since quitting: 35.1   Smokeless tobacco: Never  Vaping Use   Vaping status: Never Used  Substance and Sexual Activity   Alcohol use: No    Comment: he quit 10/2016   Drug use: No   Sexual activity: Yes    Partners: Female  Other Topics Concern   Not on file  Social History Narrative   Not on file   Social Drivers of Health   Financial Resource Strain: Low Risk  (12/27/2023)   Overall Financial Resource Strain (CARDIA)    Difficulty of Paying Living Expenses: Not hard at all  Food Insecurity: No Food Insecurity (12/27/2023)   Hunger Vital Sign    Worried About Running Out of Food in the Last Year: Never true    Ran Out of Food in the Last Year: Never true  Transportation Needs: No Transportation Needs (12/27/2023)   PRAPARE - Administrator, Civil Service (Medical): No    Lack of Transportation (Non-Medical): No  Physical Activity: Insufficiently Active (12/27/2023)   Exercise Vital Sign    Days of Exercise per Week: 3 days    Minutes of Exercise per Session: 20 Ford  Stress: No Stress Concern Present (12/27/2023)   [employer] of Occupational Health - Occupational Stress Questionnaire    Feeling of Stress : Not at all  Social Connections: Moderately Isolated (12/27/2023)   Social Connection and Isolation Panel [NHANES]    Frequency of Communication with Friends and Family: More than three times a week    Frequency of Social Gatherings with Friends and Family: Three times a week    Attends Religious Services: Never    Active Member of Clubs or Organizations: No    Attends Engineer, structural: Never    Marital  Status: Married    Tobacco Counseling Counseling given: Not Answered    Clinical Intake:  Pre-visit preparation completed: Yes  Pain : No/denies pain     BMI - recorded: 28.5 Nutritional Status: BMI 25 -29 Overweight Nutritional Risks: None Diabetes: No  How often do you need to have someone help you when you read instructions, pamphlets, or other written materials from your doctor or pharmacy?: 1 - Never  Interpreter Needed?: No  Information entered by :: Kennedy Bucker, LPN   Activities of Daily Living     12/27/2023   10:27 AM 07/04/2023    2:31 PM  In your present state of health, do you have any difficulty performing the following activities:  Hearing? 0 0  Vision? 0 0  Difficulty concentrating or making decisions? 0 0  Walking or climbing stairs? 0 0  Dressing or  bathing? 0 0  Doing errands, shopping? 0 0  Preparing Food and eating ? N   Using the Toilet? N   In the past six months, have you accidently leaked urine? N   Do you have problems with loss of bowel control? N   Managing your Medications? N   Managing your Finances? N   Housekeeping or managing your Housekeeping? N     Patient Care Team: Alba Cory, MD as PCP - General (Family Medicine)  Indicate any recent Medical Services you may have received from other than [facility] providers in the past year (date may be approximate).     Assessment:   This is a routine wellness examination for Dennis Ford.  Hearing/Vision screen Hearing Screening - Comments:: NO AIDS Vision Screening - Comments:: NO GLASSES-    Goals Addressed             This Visit's Progress    DIET - EAT MORE FRUITS AND VEGETABLES         Depression Screen     12/27/2023   10:24 AM 07/04/2023    2:30 PM 02/26/2023   11:16 AM 01/24/2023   11:35 AM 12/21/2022   11:30 AM 12/12/2022   11:47 AM 10/02/2022   11:51 AM  PHQ 2/9 Scores  PHQ - 2 Score 1 1 2  0 1 1 0  PHQ- 9 Score 1 4 3  0 1 1 0    Fall Risk     12/27/2023   10:27  AM 11/28/2023   12:57 PM 07/04/2023    2:30 PM 02/26/2023   11:16 AM 01/24/2023   11:34 AM  Fall Risk   Falls in the past year? 0 0 0 0 0  Number falls in past yr: 0 0   0  Injury with Fall? 0 0   0  Risk for fall due to : No Fall Risks  No Fall Risks No Fall Risks No Fall Risks  Follow up Falls prevention discussed;Falls evaluation completed Falls evaluation completed Falls prevention discussed Falls prevention discussed Falls prevention discussed    MEDICARE RISK AT HOME:  Medicare Risk at Home Any stairs in or around the home?: No If so, are there any without handrails?: No Home free of loose throw rugs in walkways, pet beds, electrical cords, etc?: Yes Adequate lighting in your home to reduce risk of falls?: Yes Life alert?: No Use of a cane, walker or w/c?: No Grab bars in the bathroom?: No Shower chair or bench in shower?: No Elevated toilet seat or a handicapped toilet?: No  TIMED UP AND GO:  Was the test performed?  No  Cognitive Function: 6CIT completed        12/27/2023   10:32 AM 12/21/2022   11:38 AM 12/07/2020    9:35 AM 08/07/2019    8:27 AM  6CIT Screen  What Year? 0 points 0 points 0 points 0 points  What month? 0 points 0 points 0 points 0 points  What time? 0 points 0 points 0 points 0 points  Count back from 20 0 points 0 points 0 points 0 points  Months in reverse 0 points 4 points 2 points 0 points  Repeat phrase 10 points 10 points 6 points 6 points  Total Score 10 points 14 points 8 points 6 points    Immunizations Immunization History  Administered Date(s) Administered   PFIZER(Purple Top)SARS-COV-2 Vaccination 06/16/2020, 07/07/2020   Pneumococcal Conjugate-13 05/31/2016   Pneumococcal Polysaccharide-23 01/31/2013   Tdap 10/16/2013  Zoster, Live 07/09/2008    Screening Tests Health Maintenance  Topic Date Due   COVID-19 Vaccine (3 - Pfizer risk series) 08/04/2020   INFLUENZA VACCINE  02/04/2024 (Originally 06/07/2023)   Zoster Vaccines-  Shingrix (1 of 2) 02/26/2024 (Originally 03/27/1960)   DTaP/Tdap/Td (2 - Td or Tdap) 11/27/2024 (Originally 10/17/2023)   Medicare Annual Wellness (AWV)  12/26/2024   Pneumonia Vaccine 47+ Years old  Completed   HPV VACCINES  Aged Out   Hepatitis C Screening  Discontinued    Health Maintenance  Health Maintenance Due  Topic Date Due   COVID-19 Vaccine (3 - Pfizer risk series) 08/04/2020   Health Maintenance Items Addressed: AGED OUT  Additional Screening:  Vision Screening: Recommended annual ophthalmology exams for early detection of glaucoma and other disorders of the eye.  Dental Screening: Recommended annual dental exams for proper oral hygiene  Community Resource Referral / Chronic Care Management: CRR required this visit?  No   CCM required this visit?  No     Plan:     I have personally reviewed and noted the following in the patient's chart:   Medical and social history Use of alcohol, tobacco or illicit drugs  Current medications and supplements including opioid prescriptions. Patient is not currently taking opioid prescriptions. Functional ability and status Nutritional status Physical activity Advanced directives List of other physicians Hospitalizations, surgeries, and ER visits in previous 12 months Vitals Screenings to include cognitive, depression, and falls Referrals and appointments  In addition, I have reviewed and discussed with patient certain preventive protocols, quality metrics, and best practice recommendations. A written personalized care plan for preventive services as well as general preventive health recommendations were provided to patient.     Hal Hope, LPN   6/96/2952   After Visit Summary: (MyChart) Due to this being a telephonic visit, the after visit summary with patients personalized plan was offered to patient via MyChart   Notes: Nothing significant to report at this time.

## 2023-12-27 NOTE — Patient Instructions (Addendum)
 Mr. Whiteford , Thank you for taking time to come for your Medicare Wellness Visit. I appreciate your ongoing commitment to your health goals. Please review the following plan we discussed and let me know if I can assist you in the future.   Referrals/Orders/Follow-Ups/Clinician Recommendations: NONE  This is a list of the screening recommended for you and due dates:  Health Maintenance  Topic Date Due   COVID-19 Vaccine (3 - Pfizer risk series) 08/04/2020   Flu Shot  02/04/2024*   Zoster (Shingles) Vaccine (1 of 2) 02/26/2024*   DTaP/Tdap/Td vaccine (2 - Td or Tdap) 11/27/2024*   Medicare Annual Wellness Visit  12/26/2024   Pneumonia Vaccine  Completed   HPV Vaccine  Aged Out   Hepatitis C Screening  Discontinued  *Topic was postponed. The date shown is not the original due date.    Advanced directives: (ACP Link)Information on Advanced Care Planning can be found at [facility] [place] Advance Health Care Directives Advance Health Care Directives ([email or web address]   Next Medicare Annual Wellness Visit scheduled for next year: Yes   01/01/25 @ 8:10 AM BY PHONE

## 2024-02-27 ENCOUNTER — Ambulatory Visit: Payer: Self-pay | Admitting: Family Medicine

## 2024-02-27 ENCOUNTER — Encounter: Payer: Self-pay | Admitting: Family Medicine

## 2024-02-27 VITALS — BP 200/104 | HR 94 | Temp 98.0°F | Resp 16 | Ht 67.0 in | Wt 180.7 lb

## 2024-02-27 DIAGNOSIS — I10 Essential (primary) hypertension: Secondary | ICD-10-CM

## 2024-02-27 DIAGNOSIS — S1096XA Insect bite of unspecified part of neck, initial encounter: Secondary | ICD-10-CM | POA: Diagnosis not present

## 2024-02-27 DIAGNOSIS — W57XXXA Bitten or stung by nonvenomous insect and other nonvenomous arthropods, initial encounter: Secondary | ICD-10-CM

## 2024-02-27 DIAGNOSIS — D708 Other neutropenia: Secondary | ICD-10-CM

## 2024-02-27 DIAGNOSIS — T466X5A Adverse effect of antihyperlipidemic and antiarteriosclerotic drugs, initial encounter: Secondary | ICD-10-CM

## 2024-02-27 DIAGNOSIS — M109 Gout, unspecified: Secondary | ICD-10-CM

## 2024-02-27 DIAGNOSIS — F419 Anxiety disorder, unspecified: Secondary | ICD-10-CM

## 2024-02-27 DIAGNOSIS — E039 Hypothyroidism, unspecified: Secondary | ICD-10-CM

## 2024-02-27 DIAGNOSIS — M545 Low back pain, unspecified: Secondary | ICD-10-CM

## 2024-02-27 DIAGNOSIS — Z8673 Personal history of transient ischemic attack (TIA), and cerebral infarction without residual deficits: Secondary | ICD-10-CM

## 2024-02-27 DIAGNOSIS — G720 Drug-induced myopathy: Secondary | ICD-10-CM

## 2024-02-27 MED ORDER — BENAZEPRIL HCL 20 MG PO TABS: 20.0000 mg | ORAL_TABLET | Freq: Two times a day (BID) | ORAL | 0 refills | Status: DC

## 2024-02-27 MED ORDER — DOXYCYCLINE HYCLATE 100 MG PO TABS: 200.0000 mg | ORAL_TABLET | Freq: Once | ORAL | 0 refills | Status: AC

## 2024-02-27 MED ORDER — CLONIDINE HCL 0.1 MG PO TABS: 0.1000 mg | ORAL_TABLET | Freq: Every evening | ORAL | 0 refills | Status: DC

## 2024-02-27 NOTE — Progress Notes (Signed)
 Name: Dennis Ford   MRN: [id number]    DOB: 1941/09/22   Date:02/27/2024       Progress Note  Subjective  Chief Complaint  Chief Complaint  Patient presents with   Medical Management of Chronic Issues   Discussed the use of AI scribe software for clinical note transcription with the patient, who gave verbal consent to proceed.  History of Present Illness Dennis Ford is an 83 year old male with hypertension who presents for evaluation of blood pressure management and tick exposure.  He discovered a tick on his neck yesterday, which had been attached for over a day as it was engorged with blood. He suspects he picked it up while working on his yard  No rash or other symptoms related to the tick bite.  He has hypertension and is currently supposed to take  benazepril  10 mg three times a day and clonidine  0.1 mg at night. He occasionally misses doses, particularly the midday dose, due to being busy with activities such as gardening. He has tried various antihypertensive medications in the past, including amlodipine , hydralazine , carvedilol , Bystolic , and labetalol , but has experienced side effects or found them ineffective. His blood pressure tends to decrease when he is at home and relaxed is normal but not sure of the values   He has adult hypothyroidism and is taking levothyroxine  daily, with recent thyroid  levels reported as stable. No symptoms of constipation or dry skin.  He has a history of a mini-stroke and high cholesterol, for which he was prescribed ezetimibe  10 mg, but he is unsure if he is currently taking it. He previously experienced muscle aches with statins.  He takes gabapentin  for intermittent back pain that occasionally radiates down his legs, though he reports no current pain. He also takes tamsulosin  for an enlarged prostate, which helps with urination, though he wakes twice nightly to urinate.  He has a history of anxiety for which he was prescribed buspirone , but he is  uncertain if he is currently taking it. He plans to bring all his medications to the next visit for review.    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   Abnormal TSH 06/04/2015    Past Surgical History:  Procedure Laterality Date   COLONOSCOPY     CYST EXCISION Left 2008   Upper Back on Left Shoulder   ESOPHAGOGASTRODUODENOSCOPY (EGD) WITH PROPOFOL  N/A 11/02/2018   Procedure: ESOPHAGOGASTRODUODENOSCOPY (EGD) WITH PROPOFOL ;  Surgeon: Marnee Sink, MD;  Location: [facility];  Service: Endoscopy;  Laterality: N/A;   HERNIA REPAIR     LI[facility]    INGUINAL HERNIA REPAIR  11/27/2011   Procedure: HERNIA REPAIR INGUINAL ADULT;  Surgeon: Sherril Dole, MD;  Location: [facility];  Service: General;  Laterality: Right;  right inguinal hernia repair with mesh    Family History  Problem Relation Age of Onset   Breast cancer Mother     Social History   Tobacco Use   Smoking status: Former    Types: Cigars    Quit date: 11/22/1988    Years since quitting: 35.2   Smokeless tobacco: Never  Substance Use Topics   Alcohol use: No    Comment: he quit 10/2016     Current Outpatient  Medications:    benazepril  (LOTENSIN ) 20 MG tablet, Take 1 tablet (20 mg total) by mouth 2 (two) times daily., Disp: 180 tablet, Rfl: 0   busPIRone  (BUSPAR ) 5 MG tablet, Take 1 tablet (5 mg total) by mouth 2 (two) times daily as needed., Disp: 180 tablet, Rfl: 1   cloNIDine  (CATAPRES ) 0.1 MG tablet, Take 1 tablet (0.1 mg total) by mouth at bedtime., Disp: 90 tablet, Rfl: 0   colchicine  0.6 MG tablet, TAKE 1 TABLET BY MOUTH DAILY AS NEEDED. MAY TAKE TWO PILLS TWO HOURS APART IF NEEDED FOR GOUT ATTACK, Disp: 30 tablet, Rfl: 1    doxycycline  (VIBRA -TABS) 100 MG tablet, Take 2 tablets (200 mg total) by mouth once for 1 dose., Disp: 2 tablet, Rfl: 0   levothyroxine  (SYNTHROID ) 75 MCG tablet, Take 1 tablet (75 mcg total) by mouth daily before breakfast. One a half on Sundays, Disp: 100 tablet, Rfl: 0   tamsulosin  (FLOMAX ) 0.4 MG CAPS capsule, Take 1 capsule (0.4 mg total) by mouth daily., Disp: 90 capsule, Rfl: 3   triamcinolone  cream (KENALOG ) 0.1 %, Apply 1 Application topically 2 (two) times daily., Disp: 45 g, Rfl: 0   ezetimibe  (ZETIA ) 10 MG tablet, Take 1 tablet (10 mg total) by mouth daily. (Patient not taking: Reported on 02/27/2024), Disp: 90 tablet, Rfl: 3   gabapentin  (NEURONTIN ) 100 MG capsule, Take 1-3 capsules (100-300 mg total) by mouth at bedtime. (Patient not taking: Reported on 02/27/2024), Disp: 90 capsule, Rfl: 0  Allergies  Allergen Reactions   Aspirin      GI bleed    Bystolic  [Nebivolol  Hcl]     Rash after taking it for months    Norvasc  [Amlodipine ] Other (See Comments)    angioedema   Statins     Per patient GI bleed and upset stomach     I personally reviewed active problem list, medication list, allergies, family history with the patient/caregiver today.   ROS  Ten systems reviewed and is negative except as mentioned in HPI    Objective Physical Exam CONSTITUTIONAL: Patient appears well-developed and well-nourished. No distress. HEENT: Head atraumatic, normocephalic, neck supple. No abnormal findings on neck examination. CARDIOVASCULAR: Normal rate, regular rhythm and normal heart sounds. No murmur heard. No edema in extremities. PULMONARY: Effort normal and breath sounds normal. Lungs clear to auscultation bilaterally. No respiratory distress. ABDOMINAL: There is no tenderness or distention. MUSCULOSKELETAL: Normal gait. Without gross motor or sensory deficit. PSYCHIATRIC: Patient has a normal mood and affect. Behavior is normal. Judgment and thought content normal. SKIN: No rashes on  neck, mild induration  present from tick removal on right side of neck   Vitals:   02/27/24 1300 02/27/24 1311  BP: (!) 196/102 (!) 200/104  Pulse: 94   Resp: 16   Temp: 98 F (36.7 C)   TempSrc: Oral   SpO2: 100%   Weight: 180 lb 11.2 oz (82 kg)   Height: 5\' 7"  (1.702 m)     Body mass index is 28.3 kg/m.   PHQ2/9:    02/27/2024   12:55 PM 12/27/2023   10:24 AM 07/04/2023    2:30 PM 02/26/2023   11:16 AM 01/24/2023   11:35 AM  Depression screen PHQ 2/9  Decreased Interest 0 0 1 1 0  Down, Depressed, Hopeless 0 1 0 1 0  PHQ - 2 Score 0 1 1 2  0  Altered sleeping 0 0 0 0 0  Tired, decreased energy 0 0 3 0 0  Change in appetite  0 0 0 0 0  Feeling bad or failure about yourself  0 0 0 0 0  Trouble concentrating 0 0 0 1 0  Moving slowly or fidgety/restless 0 0 0 0 0  Suicidal thoughts 0 0 0 0 0  PHQ-9 Score 0 1 4 3  0  Difficult doing work/chores Not difficult at all Not difficult at all Not difficult at all Not difficult at all     phq 9 is negative  Fall Risk:    02/27/2024   12:55 PM 12/27/2023   10:27 AM 11/28/2023   12:57 PM 07/04/2023    2:30 PM 02/26/2023   11:16 AM  Fall Risk   Falls in the past year? 0 0 0 0 0  Number falls in past yr: 0 0 0    Injury with Fall? 0 0 0    Risk for fall due to : No Fall Risks No Fall Risks  No Fall Risks No Fall Risks  Follow up Falls prevention discussed;Education provided;Falls evaluation completed Falls prevention discussed;Falls evaluation completed Falls evaluation completed Falls prevention discussed Falls prevention discussed     Assessment & Plan Hypertension also white coat HTN Chronic hypertension with poor control due to inconsistent medication adherence. Discussed risks of uncontrolled hypertension, including stroke and heart attack. - Discontinue current antihypertensive regimen that includes benazepril  10 mg TID and clonidine  0.1 mg TID since not taking as prescribed  - Initiate benazepril  20 mg twice daily. -  Continue clonidine  0.1 mg at night. - Consider nephrology referral if blood pressure remains uncontrolled. - Bring blood pressure log and medications to next visit.  Transient ischemic attack (TIA) Ongoing risk of cerebrovascular events due to uncontrolled hypertension. Emphasized blood pressure control to prevent future events.  Hyperlipidemia Managed with ezetimibe  due to statin intolerance. Uncertain adherence. - Bring all medications to next visit to confirm adherence.  Hypothyroidism Well-managed on levothyroxine  with normal thyroid  function tests.  Other  leukopenia Stable white blood cell count of 3.3, likely related to ethnicity.  Benign prostatic hyperplasia with lower urinary tract symptoms Managed with tamsulosin . Nocturia twice per night, acceptable to him.  Back pain with radiculopathy Intermittent pain well-controlled with gabapentin . No current symptoms.  Anxiety Managed with buspirone . Uncertain adherence. - Bring all medications to next visit to confirm adherence.  Tick bite prophylaxis Recent tick bite with unknown duration. No rash or symptoms. Discussed prophylactic treatment due to frequent outdoor exposure. - Administer doxycycline  200 mg as a single dose for prophylaxis against tick-borne diseases.  Follow-up Follow-up visit planned to reassess blood pressure control and medication adherence. - Schedule follow-up in one month. - Bring all medications, blood pressure log, and blood pressure cuff to next visit.

## 2024-03-27 ENCOUNTER — Other Ambulatory Visit: Payer: Self-pay | Admitting: Family Medicine

## 2024-03-27 DIAGNOSIS — E039 Hypothyroidism, unspecified: Secondary | ICD-10-CM

## 2024-03-28 ENCOUNTER — Encounter: Payer: Self-pay | Admitting: Family Medicine

## 2024-03-28 ENCOUNTER — Ambulatory Visit: Admitting: Family Medicine

## 2024-03-28 VITALS — BP 162/92 | HR 99 | Resp 16 | Ht 67.0 in | Wt 180.2 lb

## 2024-03-28 DIAGNOSIS — S30860A Insect bite (nonvenomous) of lower back and pelvis, initial encounter: Secondary | ICD-10-CM

## 2024-03-28 DIAGNOSIS — W57XXXA Bitten or stung by nonvenomous insect and other nonvenomous arthropods, initial encounter: Secondary | ICD-10-CM | POA: Diagnosis not present

## 2024-03-28 DIAGNOSIS — I10 Essential (primary) hypertension: Secondary | ICD-10-CM | POA: Diagnosis not present

## 2024-03-28 MED ORDER — DOXYCYCLINE HYCLATE 100 MG PO TABS: 100.0000 mg | ORAL_TABLET | Freq: Two times a day (BID) | ORAL | 1 refills | Status: DC

## 2024-03-28 NOTE — Progress Notes (Signed)
 Name: Dennis Ford   MRN: [id number]    DOB: April 27, 1941   Date:03/28/2024       Progress Note  Subjective  Chief Complaint  Chief Complaint  Patient presents with   Medical Management of Chronic Issues   HPI   He has hypertension and is currently supposed to take  benazepril  10 mg three times a day and clonidine  0.1 mg at night. He occasionally misses doses, particularly the midday dose, due to being busy with activities such as gardening. He has tried various antihypertensive medications in the past, including amlodipine , hydralazine , carvedilol , Bystolic , and labetalol , but has experienced side effects or found them ineffective. He is now taking benazepril  20 mg BID but bp is still elevated, he refuses seeing nephrologist   Tic bite, wife removed yesterday but not sure how long it was attached, we will refill doxy to take two pills and one refill.   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   Anxiety 01/03/2023   Iron  deficiency anemia 07/07/2019   History of TIA (transient ischemic attack) 12/10/2018   HLD (hyperlipidemia) 11/02/2018   Intermittent low back pain 12/20/2016   Hyperglycemia 06/01/2016   Hypertension, benign 05/31/2016   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    Past Surgical History:  Procedure Laterality Date   COLONOSCOPY     CYST EXCISION Left 2008   Upper Back on Left Shoulder   ESOPHAGOGASTRODUODENOSCOPY (EGD) WITH PROPOFOL  N/A 11/02/2018   Procedure: ESOPHAGOGASTRODUODENOSCOPY (EGD) WITH PROPOFOL ;  Surgeon: Marnee Sink, MD;  Location: [facility];  Service: Endoscopy;  Laterality: N/A;   HERNIA REPAIR     LI[facility]    INGUINAL HERNIA REPAIR  11/27/2011   Procedure: HERNIA REPAIR INGUINAL ADULT;  Surgeon: Diantha Fossa III, MD;  Location: [facility];  Service: General;  Laterality: Right;  right inguinal hernia repair with mesh    Family History  Problem Relation Age of Onset   Breast cancer Mother     Social History   Tobacco Use   Smoking status: Former    Types: Cigars    Quit date: 11/22/1988    Years since quitting: 35.3   Smokeless tobacco: Never  Substance Use Topics   Alcohol use: No    Comment: he quit 10/2016     Current Outpatient Medications:    benazepril  (LOTENSIN ) 20 MG tablet, Take 1 tablet (20 mg total) by mouth 2 (two) times daily., Disp: 180 tablet, Rfl: 0   busPIRone  (BUSPAR ) 5 MG tablet, Take 1 tablet (5 mg total) by mouth 2 (two) times daily as needed., Disp: 180 tablet, Rfl: 1   cloNIDine  (CATAPRES ) 0.1 MG tablet, Take 1 tablet (0.1 mg total) by mouth at bedtime., Disp: 90 tablet, Rfl: 0   colchicine  0.6 MG tablet, TAKE 1 TABLET BY MOUTH DAILY AS NEEDED. MAY TAKE TWO PILLS TWO HOURS APART IF NEEDED FOR GOUT ATTACK, Disp: 30 tablet, Rfl: 1   doxycycline  (VIBRA -TABS) 100 MG tablet, Take 1 tablet (100 mg total) by mouth 2 (two) times daily., Disp: 2 tablet, Rfl: 1   gabapentin  (NEURONTIN ) 100 MG capsule, Take 1-3 capsules (100-300 mg total) by mouth at bedtime., Disp: 90 capsule, Rfl: 0   levothyroxine  (SYNTHROID ) 75 MCG tablet, TAKE 1 TABLET (75 MCG TOTAL) BY MOUTH DAILY BEFORE BREAKFAST. ONE A HALF ON  SUNDAYS, Disp: 85 tablet, Rfl: 1   tamsulosin  (FLOMAX ) 0.4 MG CAPS capsule, Take 1 capsule (0.4 mg total) by mouth daily., Disp: 90 capsule, Rfl: 3   triamcinolone  cream (KENALOG ) 0.1 %, Apply 1 Application topically 2 (two) times daily., Disp: 45 g, Rfl: 0   ezetimibe  (ZETIA ) 10 MG tablet, Take 1 tablet (10 mg total) by mouth daily. (Patient not taking: Reported on 12/27/2023), Disp: 90 tablet, Rfl: 3  Allergies  Allergen Reactions   Aspirin      GI bleed    Bystolic  [Nebivolol  Hcl]     Rash after taking it for months    Norvasc  [Amlodipine ] Other (See Comments)    angioedema   Statins      Per patient GI bleed and upset stomach     I personally reviewed active problem list, medication list, allergies with the patient/caregiver today.   ROS  Ten systems reviewed and is negative except as mentioned in HPI    Objective Physical Exam Constitutional: Patient appears well-developed and well-nourished.  No distress.  HEENT: head atraumatic, normocephalic, pupils equal and reactive to light, neck supple Cardiovascular: Normal rate, regular rhythm and normal heart sounds.  No murmur heard. No BLE edema. Pulmonary/Chest: Effort normal and breath sounds normal. No respiratory distress. Abdominal: Soft.  There is no tenderness. Psychiatric: Patient has a normal mood and affect. behavior is normal. Judgment and thought content normal.   Vitals:   03/28/24 1328 03/28/24 1403  BP: (!) 172/96 (!) 162/92  Pulse: 99   Resp: 16   SpO2: 100%   Weight: 180 lb 3.2 oz (81.7 kg)   Height: 5\' 7"  (1.702 m)     Body mass index is 28.22 kg/m.   PHQ2/9:    03/28/2024    1:23 PM 02/27/2024   12:55 PM 12/27/2023   10:24 AM 07/04/2023    2:30 PM 02/26/2023   11:16 AM  Depression screen PHQ 2/9  Decreased Interest 0 0 0 1 1  Down, Depressed, Hopeless 0 0 1 0 1  PHQ - 2 Score 0 0 1 1 2   Altered sleeping 0 0 0 0 0  Tired, decreased energy 0 0 0 3 0  Change in appetite 0 0 0 0 0  Feeling bad or failure about yourself  0 0 0 0 0  Trouble concentrating 0 0 0 0 1  Moving slowly or fidgety/restless 0 0 0 0 0  Suicidal thoughts 0 0 0 0 0  PHQ-9 Score 0 0 1 4 3   Difficult doing work/chores Not difficult at all Not difficult at all Not difficult at all Not difficult at all Not difficult at all    phq 9 is negative  Fall Risk:    03/28/2024    1:23 PM 02/27/2024   12:55 PM 12/27/2023   10:27 AM 11/28/2023   12:57 PM 07/04/2023    2:30 PM  Fall Risk   Falls in the past year? 0 0 0 0 0  Number falls in past yr: 0 0 0 0   Injury with Fall? 0 0 0 0   Risk for fall due to : No Fall Risks  No Fall Risks No Fall Risks  No Fall Risks  Follow up Falls prevention discussed;Education provided;Falls evaluation completed Falls prevention discussed;Education provided;Falls evaluation completed Falls prevention discussed;Falls evaluation completed Falls evaluation completed Falls prevention discussed      Assessment & Plan 1. Uncontrolled hypertension (Primary)  Continue current medications, he is afraid to change  2.  White coat syndrome with diagnosis of hypertension  Not checking bp at home, advised to get a new bp monitor and check at home, bring it with him next visit to check with his bp cuff and ours to make sure reading is accurate  3. Tick bite of back, initial encounter  - doxycycline  (VIBRA -TABS) 100 MG tablet; Take 1 tablet (100 mg total) by mouth 2 (two) times daily.  Dispense: 2 tablet; Refill: 1

## 2024-05-23 ENCOUNTER — Other Ambulatory Visit: Payer: Self-pay | Admitting: Family Medicine

## 2024-05-23 DIAGNOSIS — I10 Essential (primary) hypertension: Secondary | ICD-10-CM

## 2024-05-24 ENCOUNTER — Other Ambulatory Visit: Payer: Self-pay | Admitting: Family Medicine

## 2024-05-24 DIAGNOSIS — I10 Essential (primary) hypertension: Secondary | ICD-10-CM

## 2024-05-27 ENCOUNTER — Other Ambulatory Visit: Payer: Self-pay | Admitting: Family Medicine

## 2024-05-27 DIAGNOSIS — I10 Essential (primary) hypertension: Secondary | ICD-10-CM

## 2024-06-19 ENCOUNTER — Other Ambulatory Visit: Payer: Self-pay | Admitting: Family Medicine

## 2024-06-19 DIAGNOSIS — I10 Essential (primary) hypertension: Secondary | ICD-10-CM

## 2024-06-22 ENCOUNTER — Other Ambulatory Visit: Payer: Self-pay | Admitting: Family Medicine

## 2024-06-22 DIAGNOSIS — I10 Essential (primary) hypertension: Secondary | ICD-10-CM

## 2024-07-01 ENCOUNTER — Ambulatory Visit: Admitting: Family Medicine

## 2024-07-27 ENCOUNTER — Other Ambulatory Visit: Payer: Self-pay | Admitting: Family Medicine

## 2024-07-27 DIAGNOSIS — I10 Essential (primary) hypertension: Secondary | ICD-10-CM

## 2024-08-28 ENCOUNTER — Other Ambulatory Visit: Payer: Self-pay | Admitting: Family Medicine

## 2024-08-28 DIAGNOSIS — E039 Hypothyroidism, unspecified: Secondary | ICD-10-CM

## 2024-08-28 DIAGNOSIS — R399 Unspecified symptoms and signs involving the genitourinary system: Secondary | ICD-10-CM

## 2024-09-22 ENCOUNTER — Ambulatory Visit: Admitting: Family Medicine

## 2024-09-22 ENCOUNTER — Encounter: Payer: Self-pay | Admitting: Family Medicine

## 2024-09-22 VITALS — BP 190/116 | HR 87 | Resp 16 | Ht 67.0 in | Wt 177.7 lb

## 2024-09-22 DIAGNOSIS — Z8673 Personal history of transient ischemic attack (TIA), and cerebral infarction without residual deficits: Secondary | ICD-10-CM

## 2024-09-22 DIAGNOSIS — M545 Low back pain, unspecified: Secondary | ICD-10-CM

## 2024-09-22 DIAGNOSIS — I10 Essential (primary) hypertension: Secondary | ICD-10-CM

## 2024-09-22 DIAGNOSIS — T466X5A Adverse effect of antihyperlipidemic and antiarteriosclerotic drugs, initial encounter: Secondary | ICD-10-CM

## 2024-09-22 DIAGNOSIS — G720 Drug-induced myopathy: Secondary | ICD-10-CM

## 2024-09-22 DIAGNOSIS — D708 Other neutropenia: Secondary | ICD-10-CM

## 2024-09-22 DIAGNOSIS — R972 Elevated prostate specific antigen [PSA]: Secondary | ICD-10-CM

## 2024-09-22 DIAGNOSIS — M109 Gout, unspecified: Secondary | ICD-10-CM

## 2024-09-22 DIAGNOSIS — E039 Hypothyroidism, unspecified: Secondary | ICD-10-CM

## 2024-09-22 DIAGNOSIS — E7800 Pure hypercholesterolemia, unspecified: Secondary | ICD-10-CM

## 2024-09-22 DIAGNOSIS — R399 Unspecified symptoms and signs involving the genitourinary system: Secondary | ICD-10-CM | POA: Diagnosis not present

## 2024-09-22 DIAGNOSIS — D508 Other iron deficiency anemias: Secondary | ICD-10-CM

## 2024-09-22 MED ORDER — BENAZEPRIL HCL 40 MG PO TABS: 40.0000 mg | ORAL_TABLET | Freq: Every day | ORAL | 0 refills | Status: AC

## 2024-09-22 MED ORDER — CLONIDINE HCL 0.1 MG PO TABS: 0.1000 mg | ORAL_TABLET | Freq: Every day | ORAL | 0 refills | Status: AC

## 2024-09-22 MED ORDER — EZETIMIBE 10 MG PO TABS: 10.0000 mg | ORAL_TABLET | Freq: Every day | ORAL | 3 refills | Status: AC

## 2024-09-22 MED ORDER — TAMSULOSIN HCL 0.4 MG PO CAPS: 0.4000 mg | ORAL_CAPSULE | Freq: Every day | ORAL | 3 refills | Status: AC

## 2024-09-22 NOTE — Progress Notes (Signed)
 Name: Dennis Ford   MRN: [id number]    DOB: 1941/02/06   Date:09/22/2024       Progress Note  Subjective  Chief Complaint  Chief Complaint  Patient presents with   Medical Management of Chronic Issues   Discussed the use of AI scribe software for clinical note transcription with the patient, who gave verbal consent to proceed.  History of Present Illness Dennis Ford is an 83 year old male with hypertension who presents for blood pressure management.  He has a long-standing history of hypertension, describing it as 'all of my life.' He experiences white coat hypertension and has tried multiple medications over the years. Currently, he is taking benazepril  20 mg once daily and clonidine  0.1 mg at night. He has previously taken higher doses of benazepril  but reduced the dose due to side effects. His blood pressure fluctuates at home. No chest pain, palpitations, or headaches.  He has a history of hyperlipidemia and is taking ezetimibe  for cholesterol management.  Regarding his history of gout, he has been prescribed allopurinol  in the past but is not currently taking it. He uses colchicine  as needed and reports not having had a gout attack for quite some time.  He experiences intermittent back pain and has been prescribed gabapentin , which he does not take regularly.  He has a history of hypothyroidism and takes levothyroxine  75 mcg daily, with an additional half tablet on Sundays.  He has an enlarged prostate and takes tamsulosin , which helps with urination. PSA has been slightly above normal but does not want further evaluation  He has a history of iron  deficiency without anemia and is not currently taking iron  supplements. His iron  levels have been low since 2020, but he has not experienced any symptoms of anemia. He is not interested in further evaluation - such as referral to GI and procedures but does not mind having labs done  No recent swelling. He remains active, engaging in  activities around the house and yard.    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    Past Surgical History:  Procedure Laterality Date   COLONOSCOPY     CYST EXCISION Left 2008   Upper Back on Left Shoulder   ESOPHAGOGASTRODUODENOSCOPY (EGD) WITH PROPOFOL  N/A 11/02/2018   Procedure: ESOPHAGOGASTRODUODENOSCOPY (EGD) WITH PROPOFOL ;  Surgeon: Jinny Carmine, MD;  Location: [facility];  Service: Endoscopy;  Laterality: N/A;   HERNIA REPAIR     [facility]    INGUINAL HERNIA REPAIR  11/27/2011   Procedure: HERNIA REPAIR INGUINAL ADULT;  Surgeon: Lynwood MALVA Kimble DOUGLAS, MD;  Location: [facility];  Service: General;  Laterality: Right;  right inguinal hernia repair with mesh    Family History  Problem Relation Age of Onset   Breast cancer Mother     Social History   Tobacco Use   Smoking status: Former    Types: Cigars    Quit date: 11/22/1988    Years since quitting: 35.8   Smokeless  tobacco: Never  Substance Use Topics   Alcohol use: No    Comment: he quit 10/2016     Current Outpatient Medications:    busPIRone  (BUSPAR ) 5 MG tablet, Take 1 tablet (5 mg total) by mouth 2 (two) times daily as needed., Disp: 180 tablet, Rfl: 1   colchicine  0.6 MG tablet, TAKE 1 TABLET BY MOUTH DAILY AS NEEDED. MAY TAKE TWO PILLS TWO HOURS APART IF NEEDED FOR GOUT ATTACK, Disp: 30 tablet, Rfl: 1   gabapentin  (NEURONTIN ) 100 MG  capsule, Take 1-3 capsules (100-300 mg total) by mouth at bedtime., Disp: 90 capsule, Rfl: 0   levothyroxine  (SYNTHROID ) 75 MCG tablet, TAKE 1 TABLET (75 MCG TOTAL) BY MOUTH DAILY BEFORE BREAKFAST. ONE A HALF ON SUNDAYS, Disp: 85 tablet, Rfl: 1   triamcinolone  cream (KENALOG ) 0.1 %, Apply 1 Application topically 2 (two) times daily., Disp: 45 g, Rfl: 0   benazepril  (LOTENSIN ) 40 MG tablet, Take 1 tablet (40 mg total) by mouth daily., Disp: 90 tablet, Rfl: 0   cloNIDine  (CATAPRES ) 0.1 MG tablet, Take 1 tablet (0.1 mg total) by mouth at bedtime., Disp: 90 tablet, Rfl: 0   ezetimibe  (ZETIA ) 10 MG tablet, Take 1 tablet (10 mg total) by mouth daily., Disp: 90 tablet, Rfl: 3   tamsulosin  (FLOMAX ) 0.4 MG CAPS capsule, Take 1 capsule (0.4 mg total) by mouth daily., Disp: 90 capsule, Rfl: 3  Allergies  Allergen Reactions   Aspirin      GI bleed    Bystolic  [Nebivolol  Hcl]     Rash after taking it for months    Norvasc  [Amlodipine ] Other (See Comments)    angioedema   Statins     Per patient GI bleed and upset stomach     I personally reviewed active problem list, medication list, allergies, family history with the patient/caregiver today.   ROS  Ten systems reviewed and is negative except as mentioned in HPI    Objective Physical Exam VITALS: BP- 190/116 CONSTITUTIONAL: Patient appears well-developed and well-nourished. No distress. HEENT: Head atraumatic, normocephalic, neck supple. CARDIOVASCULAR: Normal rate, regular rhythm and normal heart sounds. No murmur heard. No edema in extremities. PULMONARY: Effort normal and breath sounds normal. No respiratory distress. ABDOMINAL: There is no tenderness or distention. MUSCULOSKELETAL: Normal gait. Without gross motor or sensory deficit. PSYCHIATRIC: Patient has a normal mood and affect. Behavior is normal. Judgment and thought content normal.  Vitals:   09/22/24 1500 09/22/24 1524  BP: (!) 190/116 (!) 190/116  Pulse: 87   Resp: 16    SpO2: 96%   Weight: 177 lb 11.2 oz (80.6 kg)   Height: 5' 7 (1.702 m)     Body mass index is 27.83 kg/m.    PHQ2/9:    09/22/2024    2:57 PM 03/28/2024    1:23 PM 02/27/2024   12:55 PM 12/27/2023   10:24 AM 07/04/2023    2:30 PM  Depression screen PHQ 2/9  Decreased Interest 0 0 0 0 1  Down, Depressed, Hopeless 0 0 0 1 0  PHQ - 2 Score 0 0 0 1 1  Altered sleeping  0 0 0 0  Tired, decreased energy  0 0 0 3  Change in appetite  0 0 0 0  Feeling bad or failure about yourself   0 0 0 0  Trouble concentrating  0 0 0 0  Moving slowly or fidgety/restless  0 0 0 0  Suicidal thoughts  0 0 0 0  PHQ-9 Score  0  0  1  4   Difficult doing work/chores  Not difficult at all Not difficult at all Not difficult at all Not difficult at all     Data saved with a previous flowsheet row definition    phq 9 is negative  Fall Risk:    09/22/2024    2:57 PM 03/28/2024    1:23 PM 02/27/2024   12:55 PM 12/27/2023   10:27 AM 11/28/2023   12:57 PM  Fall Risk   Falls in the past year? 0 0 0 0 0  Number falls in past yr: 0 0 0 0 0  Injury with Fall? 0 0 0 0 0  Risk for fall due to : No Fall Risks No Fall Risks No Fall Risks No Fall Risks   Follow up Falls evaluation completed Falls prevention discussed;Education provided;Falls evaluation completed Falls prevention discussed;Education provided;Falls evaluation completed Falls prevention discussed;Falls evaluation completed Falls evaluation completed     Assessment & Plan Essential hypertension (white coat hypertension, uncontrolled) Chronic uncontrolled hypertension with white coat effect. Home readings variable, office readings elevated. Prefers increased benazepril  dosage. - Increased benazepril  to 40 mg daily in the morning. - Continue clonidine  0.1 mg at night. - Bring home blood pressure machine to next appointment for calibration. - Scheduled follow-up in two weeks for blood pressure check.  Hyperlipidemia Chronic hyperlipidemia  managed with ezetimibe . - Continue ezetimibe . - Ordered lipid panel.  Hypothyroidism Chronic hypothyroidism managed with levothyroxine . - Continue current levothyroxine  regimen.  Benign prostatic hyperplasia with elevated PSA Benign prostatic hyperplasia with stable PSA. Declines further urological evaluation. - Continue tamsulosin . - No further PSA monitoring as per his preference.  Iron  deficiency without anemia Chronic iron  deficiency without anemia. Declines further invasive evaluation. - Ordered blood test to check iron  levels.  Gout Asymptomatic, not taking allopurinol  regularly. - Continue allopurinol  as needed for gout attacks.  General Health Maintenance Lacks recent vaccinations.

## 2024-09-23 ENCOUNTER — Other Ambulatory Visit: Payer: Self-pay

## 2024-09-23 ENCOUNTER — Ambulatory Visit: Payer: Self-pay | Admitting: Family Medicine

## 2024-09-23 DIAGNOSIS — E039 Hypothyroidism, unspecified: Secondary | ICD-10-CM

## 2024-09-23 LAB — CBC WITH DIFFERENTIAL/PLATELET
Absolute Lymphocytes: 1302 {cells}/uL (ref 850–3900)
Absolute Monocytes: 439 {cells}/uL (ref 200–950)
Basophils Absolute: 51 {cells}/uL (ref 0–200)
Basophils Relative: 1.5 %
Eosinophils Absolute: 61 {cells}/uL (ref 15–500)
Eosinophils Relative: 1.8 %
HCT: 47.2 % (ref 38.5–50.0)
Hemoglobin: 14.7 g/dL (ref 13.2–17.1)
MCH: 26.3 pg — ABNORMAL LOW (ref 27.0–33.0)
MCHC: 31.1 g/dL — ABNORMAL LOW (ref 32.0–36.0)
MCV: 84.3 fL (ref 80.0–100.0)
MPV: 11.3 fL (ref 7.5–12.5)
Monocytes Relative: 12.9 %
Neutro Abs: 1547 {cells}/uL (ref 1500–7800)
Neutrophils Relative %: 45.5 %
Platelets: 343 Thousand/uL (ref 140–400)
RBC: 5.6 Million/uL (ref 4.20–5.80)
RDW: 14.5 % (ref 11.0–15.0)
Total Lymphocyte: 38.3 %
WBC: 3.4 Thousand/uL — ABNORMAL LOW (ref 3.8–10.8)

## 2024-09-23 LAB — LIPID PANEL
Cholesterol: 167 mg/dL (ref <200)
HDL: 58 mg/dL (ref >=40)
LDL Cholesterol (Calc): 82 mg/dL
Non-HDL Cholesterol (Calc): 109 mg/dL (ref <130)
Total CHOL/HDL Ratio: 2.9 (calc) (ref <5.0)
Triglycerides: 175 mg/dL — ABNORMAL HIGH (ref <150)

## 2024-09-23 LAB — COMPREHENSIVE METABOLIC PANEL WITH GFR
AG Ratio: 1.2 (calc) (ref 1.0–2.5)
ALT: 9 U/L (ref 9–46)
AST: 15 U/L (ref 10–35)
Albumin: 4 g/dL (ref 3.6–5.1)
Alkaline phosphatase (APISO): 55 U/L (ref 35–144)
BUN: 12 mg/dL (ref 7–25)
CO2: 27 mmol/L (ref 20–32)
Calcium: 9.3 mg/dL (ref 8.6–10.3)
Chloride: 103 mmol/L (ref 98–110)
Creat: 1.07 mg/dL (ref 0.70–1.22)
Globulin: 3.4 g/dL (ref 1.9–3.7)
Glucose, Bld: 89 mg/dL (ref 65–99)
Potassium: 4.3 mmol/L (ref 3.5–5.3)
Sodium: 137 mmol/L (ref 135–146)
Total Bilirubin: 0.4 mg/dL (ref 0.2–1.2)
Total Protein: 7.4 g/dL (ref 6.1–8.1)
eGFR: 69 mL/min/1.73m2 (ref >=60)

## 2024-09-23 LAB — URIC ACID: Uric Acid, Serum: 8 mg/dL (ref 4.0–8.0)

## 2024-09-23 LAB — TSH: TSH: 5.57 m[IU]/L — ABNORMAL HIGH (ref 0.40–4.50)

## 2024-09-23 LAB — IRON,TIBC AND FERRITIN PANEL
%SAT: 43 % (ref 20–48)
Ferritin: 33 ng/mL (ref 24–380)
Iron: 136 ug/dL (ref 50–180)
TIBC: 319 ug/dL (ref 250–425)

## 2024-09-23 MED ORDER — LEVOTHYROXINE SODIUM 75 MCG PO TABS
75.0000 ug | ORAL_TABLET | Freq: Every day | ORAL | 0 refills | Status: AC
Start: 2024-09-23 — End: ?

## 2024-10-07 ENCOUNTER — Ambulatory Visit

## 2024-10-07 VITALS — BP 172/96

## 2024-10-07 DIAGNOSIS — I10 Essential (primary) hypertension: Secondary | ICD-10-CM

## 2024-10-07 DIAGNOSIS — Z0130 Encounter for examination of blood pressure without abnormal findings: Secondary | ICD-10-CM

## 2024-10-07 NOTE — Progress Notes (Signed)
 Patient is in office today for a nurse visit for Blood Pressure Check. Patient blood pressure was 180/90 and after rest 172/96, Patient No chest pain, No shortness of breath, No dyspnea on exertion   Essential hypertension (white coat hypertension, uncontrolled) Chronic uncontrolled hypertension with white coat effect. Home readings variable, office readings elevated. Prefers increased benazepril  dosage. - Increased benazepril  to 40 mg daily in the morning. - Continue clonidine  0.1 mg at night. - Bring home blood pressure machine to next appointment for calibration. - Scheduled follow-up in two weeks for blood pressure check

## 2024-10-21 ENCOUNTER — Other Ambulatory Visit: Payer: Self-pay | Admitting: Family Medicine

## 2024-10-21 DIAGNOSIS — E039 Hypothyroidism, unspecified: Secondary | ICD-10-CM

## 2024-10-23 NOTE — Telephone Encounter (Signed)
 Requested Prescriptions  Pending Prescriptions Disp Refills   levothyroxine  (SYNTHROID ) 75 MCG tablet [Pharmacy Med Name: LEVOTHYROXINE  75 MCG TABLET] 90 tablet 3    Sig: TAKE 1 TABLET (75 MCG TOTAL) BY MOUTH DAILY BEFORE BREAKFAST. ONE A HALF ON SUNDAYS     Endocrinology:  Hypothyroid Agents Failed - 10/23/2024  2:36 PM      Failed - TSH in normal range and within 360 days    TSH  Date Value Ref Range Status  09/22/2024 5.57 (H) 0.40 - 4.50 mIU/L Final         Passed - Valid encounter within last 12 months    Recent Outpatient Visits           1 month ago White coat syndrome with diagnosis of hypertension   [facility] [place] Glenard Mire, MD   6 months ago Uncontrolled hypertension   [facility] Glenard Mire, MD   7 months ago Other neutropenia   [facility] Sowles, Krichna, MD

## 2024-12-23 ENCOUNTER — Ambulatory Visit: Admitting: Family Medicine

## 2025-01-01 ENCOUNTER — Ambulatory Visit: Payer: Medicare Other
